# Patient Record
Sex: Female | Born: 1990 | Race: Black or African American | Hispanic: No | Marital: Married | Smoking: Former smoker
Health system: Southern US, Community
[De-identification: ages and names within clinical notes are randomized; demographics above are authoritative.]

## PROBLEM LIST (undated history)

## (undated) ENCOUNTER — Inpatient Hospital Stay (HOSPITAL_COMMUNITY): Payer: Self-pay

## (undated) DIAGNOSIS — N760 Acute vaginitis: Secondary | ICD-10-CM

## (undated) DIAGNOSIS — Z7251 High risk heterosexual behavior: Secondary | ICD-10-CM

## (undated) DIAGNOSIS — K029 Dental caries, unspecified: Secondary | ICD-10-CM

## (undated) DIAGNOSIS — B9689 Other specified bacterial agents as the cause of diseases classified elsewhere: Secondary | ICD-10-CM

## (undated) DIAGNOSIS — F1210 Cannabis abuse, uncomplicated: Secondary | ICD-10-CM

## (undated) DIAGNOSIS — Z720 Tobacco use: Secondary | ICD-10-CM

## (undated) HISTORY — PX: NO PAST SURGERIES: SHX2092

## (undated) HISTORY — DX: Cannabis abuse, uncomplicated: F12.10

## (undated) HISTORY — DX: Dental caries, unspecified: K02.9

## (undated) HISTORY — DX: High risk heterosexual behavior: Z72.51

---
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
## 2012-01-05 ENCOUNTER — Ambulatory Visit: Payer: Medicaid Other | Admitting: Family Medicine

## 2012-01-09 ENCOUNTER — Ambulatory Visit (INDEPENDENT_AMBULATORY_CARE_PROVIDER_SITE_OTHER): Payer: Medicaid Other | Admitting: Sports Medicine

## 2012-01-09 VITALS — BP 110/60 | Ht 67.0 in | Wt 170.0 lb

## 2012-01-09 DIAGNOSIS — M25562 Pain in left knee: Secondary | ICD-10-CM

## 2012-01-09 DIAGNOSIS — M549 Dorsalgia, unspecified: Secondary | ICD-10-CM

## 2012-01-09 DIAGNOSIS — M25569 Pain in unspecified knee: Secondary | ICD-10-CM

## 2012-01-09 MED ORDER — NAPROXEN 500 MG PO TABS: 500.0000 mg | ORAL_TABLET | Freq: Two times a day (BID) | ORAL | Status: AC

## 2012-01-09 NOTE — Assessment & Plan Note (Signed)
This patient has an unstable patella. We will start with anti-inflammatories, patellar stabilizing orthosis, as well as formal physical therapy to strengthen her VMO. I will also get some x-rays of her knee. I will see her back in a period of 4-6 weeks, if she is no better at that point we will discuss MRI and surgical intervention. Surgical intervention would involve either lateral release or Fulkerson slide.

## 2012-01-09 NOTE — Progress Notes (Signed)
  Subjective:    Patient ID: Alicia Dunlap, female    DOB: Oct 07, 1991, 21 y.o.   MRN: [id number]  HPI The patient comes in as a referral from, family practice. She's had a long history of left knee pain. She remembers an episode many years ago where she had a patellar luxation. It was back in, however since then she's had episodes of swelling, catching, popping. She denies any locking or buckling. She's never had any treatment for this, and has not yet had any imaging.  Past medical history, surgical history, family history, social history, allergies, and medications reviewed from the medical record and no changes needed.  Review of Systems    No fevers, chills, night sweats, weight loss, chest pain, or shortness of breath.  Social History: Non-smoker. Objective:   Physical Exam General:  Well developed, well nourished, and in no acute distress. Neuro:  Alert and oriented x3, extra-ocular muscles intact. Skin: Warm and dry, no rashes noted. Respiratory:  Not using accessory muscles, speaking in full sentences. Musculoskeletal: Left Knee: Normal to inspection with no erythema or effusion or obvious bony abnormalities. Palpation normal with no warmth, joint line tenderness, patellar tenderness, or condyle tenderness. ROM full in flexion and extension and lower leg rotation. Ligaments with solid consistent endpoints including ACL, PCL, LCL, MCL. Negative Mcmurray's, Apley's, and Thessalonian tests. Non painful patellar compression. Patellar glide without crepitus. Patellar and quadriceps tendons unremarkable. Poor VMO definition. Positive patellar apprehension sign.  Back Exam: Inspection: Unremarkable Motion: Flexion 45 deg, Extension 45 deg, Side Bending to 45 deg bilaterally,  Rotation to 45 deg bilaterally SLR laying:  Negative XSLR laying: Negative Palpable tenderness: None FABER: negative Sensory change: Gross sensation intact to all lumbar and sacral  dermatomes. Reflexes: 2+ at both patellar tendons, 2+ at achilles tendons, Babinski's downgoing.  Strength at foot Plantar-flexion: 5/5    Dorsi-flexion: 5/5    Eversion: 5/5   Inversion: 5/5 Leg strength Quad: 5/5   Hamstring: 5/5   Hip flexor: 5/5   Hip abductors: 5/5 Gait unremarkable.  MSK ultrasound left knee: No effusion. Meniscal structures intact. Quadriceps and patellar tendons intact. Images saved      Assessment & Plan:

## 2012-01-09 NOTE — Patient Instructions (Signed)
Great to see you Alicia Dunlap,  X-rays of your knee. Naproxen twice a day for pain. Patellar stabilizing knee brace. Need to do formal physical therapy.  Come back to see me in 5 weeks.   [name]. [name], M.D. [facility] [address], [place] [address] [phone]

## 2012-01-09 NOTE — Assessment & Plan Note (Signed)
Symptoms most likely related to posture, as well as weak paraspinals. No radicular signs. Will include this in for formal physical therapy.

## 2012-01-27 ENCOUNTER — Emergency Department (HOSPITAL_COMMUNITY): Payer: Self-pay

## 2012-01-27 ENCOUNTER — Encounter (HOSPITAL_COMMUNITY): Payer: Self-pay | Admitting: Emergency Medicine

## 2012-01-27 ENCOUNTER — Emergency Department (HOSPITAL_COMMUNITY)
Admission: EM | Admit: 2012-01-27 | Discharge: 2012-01-28 | Disposition: A | Discharge status: Home / Self Care | Payer: Self-pay | Attending: Emergency Medicine | Admitting: Emergency Medicine

## 2012-01-27 DIAGNOSIS — R0602 Shortness of breath: Secondary | ICD-10-CM | POA: Insufficient documentation

## 2012-01-27 DIAGNOSIS — F17200 Nicotine dependence, unspecified, uncomplicated: Secondary | ICD-10-CM | POA: Insufficient documentation

## 2012-01-27 DIAGNOSIS — R197 Diarrhea, unspecified: Secondary | ICD-10-CM | POA: Insufficient documentation

## 2012-01-27 DIAGNOSIS — K529 Noninfective gastroenteritis and colitis, unspecified: Secondary | ICD-10-CM

## 2012-01-27 DIAGNOSIS — R079 Chest pain, unspecified: Secondary | ICD-10-CM | POA: Insufficient documentation

## 2012-01-27 DIAGNOSIS — K5289 Other specified noninfective gastroenteritis and colitis: Secondary | ICD-10-CM | POA: Insufficient documentation

## 2012-01-27 DIAGNOSIS — R51 Headache: Secondary | ICD-10-CM | POA: Insufficient documentation

## 2012-01-27 DIAGNOSIS — R112 Nausea with vomiting, unspecified: Secondary | ICD-10-CM | POA: Insufficient documentation

## 2012-01-27 LAB — POCT PREGNANCY, URINE: Preg Test, Ur: NEGATIVE

## 2012-01-27 MED ORDER — METOCLOPRAMIDE HCL 5 MG/ML IJ SOLN
10.0000 mg | Freq: Once | INTRAMUSCULAR | Status: AC
Start: 2012-01-28 — End: 2012-01-28
  Administered 2012-01-28: 10 mg via INTRAVENOUS
  Filled 2012-01-27: qty 2

## 2012-01-27 MED ORDER — ONDANSETRON HCL 4 MG/2ML IJ SOLN
4.0000 mg | Freq: Once | INTRAMUSCULAR | Status: AC
  Administered 2012-01-27: 4 mg via INTRAVENOUS
  Filled 2012-01-27: qty 2

## 2012-01-27 MED ORDER — SODIUM CHLORIDE 0.9 % IV BOLUS (SEPSIS)
1000.0000 mL | Freq: Once | INTRAVENOUS | Status: AC
  Administered 2012-01-27: 1000 mL via INTRAVENOUS

## 2012-01-27 MED ORDER — SODIUM CHLORIDE 0.9 % IV BOLUS (SEPSIS)
500.0000 mL | INTRAVENOUS | Status: AC
  Administered 2012-01-28: 500 mL via INTRAVENOUS

## 2012-01-27 MED ORDER — KETOROLAC TROMETHAMINE 30 MG/ML IJ SOLN
30.0000 mg | Freq: Once | INTRAMUSCULAR | Status: AC
  Administered 2012-01-27: 30 mg via INTRAVENOUS
  Filled 2012-01-27: qty 1

## 2012-01-27 NOTE — ED Notes (Signed)
Pt presented to the ER with multiple complaints, states she has "terrible HA" 10/10, pt noted to be tearful in the triage, pt further states that sx started earlier this am and that she can not decrease pain and discomfort. Pt states that secondary to HA she has photophobia as well. Diarrhea x6 and vomiting x2 episodes since this am.

## 2012-01-27 NOTE — ED Provider Notes (Signed)
History     CSN: [id number]  Arrival date & time 01/27/12  2010   First MD Initiated Contact with Patient 01/27/12 2210      Chief Complaint  Patient presents with  . N/V/D and HA     (Consider location/radiation/quality/duration/timing/severity/associated sxs/prior treatment) HPI History provided by pt.   Pt has had vomiting and diarrhea since this morning.  Denies abdominal pain but has mild cramping prior to having a BM.  No hematemesis/hematochezia/melena or GU sx.  Also c/o intermittent, pressure-like frontal headache that also started this morning.  Aggravated by bright light.  No associated fever, dizziness or blurred vision.   No h/o migraines and denies trauma to head.  Has diffuse body aches, generalized weakness and lightheadedness.  Has also had productive cough x 1 month.  No CP but is SOB when she smokes cigarettes.  Has not taken any medications for sx.  No known sick contacts.   History reviewed. No pertinent past medical history.   History reviewed. No pertinent past surgical history.  History reviewed. No pertinent family history.  History  Substance Use Topics  . Smoking status: Current Some Day Smoker    Types: Cigarettes, Cigars  . Smokeless tobacco: Never Used  . Alcohol Use: Yes    OB History    Grav Para Term Preterm Abortions TAB SAB Ect Mult Living                  Review of Systems  All other systems reviewed and are negative.    Allergies  Amoxicillin  Home Medications   Current Outpatient Rx  Name Route Sig Dispense Refill  . NAPROXEN 500 MG PO TABS Oral Take 1 tablet (500 mg total) by mouth 2 (two) times daily with a meal. 60 tablet 3    BP 121/74  Pulse 111  Temp(Src) 99.3 F (37.4 C) (Oral)  SpO2 99%  LMP 12/22/2011  Physical Exam  Nursing note and vitals reviewed. Constitutional: She is oriented to person, place, and time. She appears well-developed and well-nourished. No distress.       Pt laying in dark room.  Does not  appear uncomfortable.   HENT:  Head: Normocephalic.  Eyes:       Normal appearance  Neck: Normal range of motion.       No meningeal signs  Cardiovascular: Normal rate and regular rhythm.   Pulmonary/Chest: Effort normal and breath sounds normal.  Abdominal: Soft. Bowel sounds are normal. She exhibits no distension and no mass. There is no tenderness. There is no rebound and no guarding.  Musculoskeletal: Normal range of motion.  Neurological: She is alert and oriented to person, place, and time. No cranial nerve deficit or sensory deficit. Coordination normal.       5/5 and equal upper and lower extremity strength.  No past pointing.    Skin: Skin is warm and dry. No rash noted.  Psychiatric: She has a normal mood and affect. Her behavior is normal.    ED Course  Procedures (including critical care time)  Labs Reviewed  URINALYSIS, ROUTINE W REFLEX MICROSCOPIC - Abnormal; Notable for the following:    APPearance CLOUDY (*)    Ketones, ur >80 (*)    All other components within normal limits  POCT PREGNANCY, URINE   Dg Chest 2 View  01/27/2012  *RADIOLOGY REPORT*  Clinical Data: Shortness of breath, vomiting, and chest pain.  CHEST - 2 VIEW  Comparison: 11/18/2011  Findings: The heart, mediastinal, and  hilar contours are normal. The lungs are well-expanded and clear.  Negative for pleural effusion. The bony thorax is unremarkable.  IMPRESSION: No acute cardiopulmonary disease.  Original Report Authenticated By: Britta Mccreedy, M.D.     1. Headache   2. Gastroenteritis       MDM  21yo F presents w/ N/V/D, non-traumatic frontal headache and 1 month of cough.  Afebrile, tachycardic, lungs clearn, abd benign/non-tender, no focal neuro deficits or meningeal signs on exam.  Pt receiving IV NS, toradol and zofran for sx.  CXR pending to r/o pneumonia d/t duration of cough.  10:38 PM   Pt reports that all of her symptoms are improved but she continues to have a mild headache.  She is  tolerating po fluids.  Will give another 1/2L bolus NS + reglan, recheck VS and reassess shortly.  11:56 PM   U/A and CXR neg for infection.  Results discussed w/ pt.  Her headache has resolved.  D/c'd home w/ prescriptions for zofran and ibuprofen.  Recommended that she drink plenty of fluids and f/u with PCP if sx have not improved by Monday.  Return precautions discussed. 12:54 AM         Otilio Miu, PA 01/28/12 [id number]

## 2012-01-28 LAB — URINALYSIS, ROUTINE W REFLEX MICROSCOPIC
Glucose, UA: NEGATIVE mg/dL
Ketones, ur: >80 mg/dL — AB
Leukocytes, UA: NEGATIVE
Specific Gravity, Urine: 1.025 (ref 1.005–1.030)
pH: 5.5 (ref 5.0–8.0)

## 2012-01-28 MED ORDER — IBUPROFEN 800 MG PO TABS: 800.0000 mg | ORAL_TABLET | Freq: Three times a day (TID) | ORAL | Status: AC

## 2012-01-28 MED ORDER — ONDANSETRON HCL 8 MG PO TABS: 8.0000 mg | ORAL_TABLET | Freq: Four times a day (QID) | ORAL | Status: AC

## 2012-01-28 NOTE — Discharge Instructions (Signed)
Take zofran as needed for nausea.   Take imodium if you have more than 3-4 episodes of diarrhea a day.  Drink plenty of fluids to prevent dehydration.  Refer to [employer] below for foods that are less likely to cause nausea.  Take ibuprofen w/ food up to three times a day, as needed for headache.  Follow up with your primary care doctor if symptoms have not started to improve by Monday.  You should return to the ER if you develop fever, worsening pain or uncontrolled vomiting.   B.R.A.T. Diet Your doctor has recommended the B.R.A.T. diet for you or your child until the condition improves. This is often used to help control diarrhea and vomiting symptoms. If you or your child can tolerate clear liquids, you may have:  Bananas.   Rice.   Applesauce.   Toast (and other simple starches such as crackers, potatoes, noodles).  Be sure to avoid dairy products, meats, and fatty foods until symptoms are better. Fruit juices such as apple, grape, and prune juice can make diarrhea worse. Avoid these. Continue this diet for 2 days or as instructed by your caregiver. Document Released: 10/30/2005 Document Revised: 10/19/2011 Document Reviewed: 04/18/2007 [facility] Patient Information 2012 [place], [place].

## 2012-01-28 NOTE — ED Provider Notes (Signed)
Medical screening examination/treatment/procedure(s) were performed by non-physician practitioner and as supervising physician I was immediately available for consultation/collaboration.    Nelia Shi, MD 01/28/12 [phone]

## 2012-02-13 ENCOUNTER — Ambulatory Visit: Payer: Medicaid Other | Admitting: Sports Medicine

## 2012-02-22 ENCOUNTER — Ambulatory Visit (INDEPENDENT_AMBULATORY_CARE_PROVIDER_SITE_OTHER): Payer: Self-pay | Admitting: Family Medicine

## 2012-02-22 ENCOUNTER — Encounter: Payer: Self-pay | Admitting: Family Medicine

## 2012-02-22 ENCOUNTER — Other Ambulatory Visit (HOSPITAL_COMMUNITY)
Admission: RE | Admit: 2012-02-22 | Discharge: 2012-02-22 | Disposition: A | Discharge status: Home / Self Care | Payer: Self-pay | Source: Ambulatory Visit | Attending: Family Medicine | Admitting: Family Medicine

## 2012-02-22 VITALS — BP 96/68 | HR 84 | Temp 98.7°F | Ht 66.0 in | Wt 158.0 lb

## 2012-02-22 DIAGNOSIS — N912 Amenorrhea, unspecified: Secondary | ICD-10-CM

## 2012-02-22 DIAGNOSIS — N898 Other specified noninflammatory disorders of vagina: Secondary | ICD-10-CM

## 2012-02-22 DIAGNOSIS — J309 Allergic rhinitis, unspecified: Secondary | ICD-10-CM

## 2012-02-22 DIAGNOSIS — Z202 Contact with and (suspected) exposure to infections with a predominantly sexual mode of transmission: Secondary | ICD-10-CM

## 2012-02-22 DIAGNOSIS — J302 Other seasonal allergic rhinitis: Secondary | ICD-10-CM | POA: Insufficient documentation

## 2012-02-22 DIAGNOSIS — Z113 Encounter for screening for infections with a predominantly sexual mode of transmission: Secondary | ICD-10-CM | POA: Insufficient documentation

## 2012-02-22 DIAGNOSIS — N760 Acute vaginitis: Secondary | ICD-10-CM

## 2012-02-22 LAB — POCT URINE PREGNANCY: Preg Test, Ur: NEGATIVE

## 2012-02-22 LAB — POCT WET PREP (WET MOUNT)

## 2012-02-22 LAB — POCT URINALYSIS DIPSTICK
Bilirubin, UA: NEGATIVE
Blood, UA: NEGATIVE
Glucose, UA: NEGATIVE
Ketones, UA: NEGATIVE
pH, UA: 5.5

## 2012-02-22 NOTE — Assessment & Plan Note (Signed)
Restart Flonase.  Recommend regular use of OTC second generation antihistamine.

## 2012-02-22 NOTE — Patient Instructions (Signed)
I will send in a refill for nasal spray.  Use this everyday for at least the next couple months during the worst of the allergy season. Try Zyrterc over the counter.  Try this daily for the next 2 weeks.  If you don't notice improvement, try either Allegra or Claritin.   If this isn't helping let me know.  We will call you with the results of the tests.

## 2012-02-22 NOTE — Progress Notes (Signed)
  Subjective:    Patient ID: Alicia Dunlap, female    DOB: [birth year], 21 y.o.   MRN: [id number]  HPI 1.  Seasonal allergies:  Started about 1 week ago but worsened after spending after 1 day spending most of day outside.  Took generic OTC med last night without any relief, what sounds like generic benadryl.  Has not taken anything else.  Trouble with allergies treated with inhaled corticosteroid last year.  Feels allergies gradually worsening over time.  No recent illnesses.  No fevers or chills.    2.  Amenorhea: Present for the past 2 months. Does describe gradually lightening of menses since December. Menses now lasting only about one day at a time. Is sexually active with 1 partner.  Uses condoms for protection every time. No abdominal pain.  3.  Vaginal discharge:  Described as whitish/clear vaginal discharge present for the past 2-3 weeks. Some itching. No burning. No nausea or vomiting.  No dysuria.    The following portions of the patient's history were reviewed and updated as appropriate: allergies, current medications, past medical history, family and social history, and problem list.  Patient is a nonsmoker.   Review of Systems See HPI above for review of systems.        Objective:   Physical Exam BP 96/68  Pulse 84  Temp(Src) 98.7 F (37.1 C) (Oral)  Ht 5\' 6"  (1.676 m)  Wt 158 lb (71.668 kg)  BMI 25.50 kg/m2  LMP 12/22/2011 Gen:  Patient sitting on exam table, appears stated age in no acute distress Head: Normocephalic atraumatic Eyes: EOMI, PERRL, sclera and conjunctiva non-erythematous Nose:  Nares patent.   Mouth: Mucosa membranes moist. Tonsils +2, nonenlarged, non-erythematous. Neck: No cervical lymphadenopathy noted Heart:  RRR, no murmurs auscultated. Pulm:  Clear to auscultation bilaterally with good air movement.  No wheezes or rales noted.   GYN:  External genitalia within normal limits.  Vaginal mucosa pink, moist, normal rugae.  Nonfriable cervix without  lesions, no discharge or bleeding noted on speculum exam.  Bimanual exam revealed normal, nongravid uterus.  No cervical motion tenderness. No adnexal masses bilaterally.            Assessment & Plan:

## 2012-02-23 DIAGNOSIS — N898 Other specified noninflammatory disorders of vagina: Secondary | ICD-10-CM | POA: Insufficient documentation

## 2012-02-23 LAB — HEPATITIS PANEL, ACUTE
HCV Ab: NEGATIVE
Hep B C IgM: NEGATIVE
Hepatitis B Surface Ag: NEGATIVE

## 2012-02-23 LAB — RPR

## 2012-02-23 NOTE — Assessment & Plan Note (Signed)
Wet prep GC chlamydia obtained today. We will call patient with results.

## 2012-02-23 NOTE — Assessment & Plan Note (Signed)
Likely secondary to Implanon use. Pregnancy test negative.

## 2012-02-27 ENCOUNTER — Telehealth: Payer: Self-pay | Admitting: Family Medicine

## 2012-02-27 MED ORDER — METRONIDAZOLE 500 MG PO TABS: 500.0000 mg | ORAL_TABLET | Freq: Two times a day (BID) | ORAL | Status: AC

## 2012-02-27 NOTE — Telephone Encounter (Signed)
Called and discussed negative labs except for BV.  Patient appreciative of call.  Discussed treatment

## 2012-03-01 NOTE — Progress Notes (Signed)
Patient ID: Alicia Dunlap, female   DOB: [birth year], 21 y.o.   MRN: [id number]   Subjective:    Patient ID: Alicia Dunlap, female    DOB: 1991-09-13, 21 y.o.   MRN: [id number]  HPI One. Back pain: Patient returns to clinic today because she states her back is no better. She states the pain is now radiating up to her upper back and muscles around her neck. She states she was crying last night because of the pain. She states the tramadol did not help at all with her pain. The baclofen did ease the pain some times. She denies any urinary incontinence saddle anesthesia or bowel incontinence. Describes dull aching pain alternating with sometimes sharp shooting pain in her back. No urinary or bowel incontinence.  #2. Left knee pain: Patient states this is also worsening. She feels that her knee is somewhat swollen. She states it has now given out on her several times and occasionally locked up. She also hears a "snap" or "pop" when bending her knee sometimes and the pain is worse both going up and downstairs. States pain is so that she bad has to take the elevator at school. No fevers or chills. No recent trauma. Does have history of patellar subluxation at age 20 and again fell with same injury " several years ago" but she is unable to remember exactly how old.  Review of Systems See HPI above for review of systems.       Objective:   Physical Exam Gen:  Alert, cooperative patient who appears stated age in no acute distress.  Vital signs reviewed. Back:  Tender along the entire back/paraspinal muscles. Also tenderness along trapezius muscles. Multiple triggerpoints noted. Tender along lumbar region. Straight leg raise negative for back pain or hamstring pain. Patient able to fully flex neck left right up and down. Knees: Right knee within the normal limits. Left knee with mild effusion noted medial and lateral joint lines. No erythema or bruising. Full range of motion both active and passively. No hip  pain. Anterior posterior drawer tests are negative. Lachman test equivocal. Palpation along medial and lateral joint line. Did not note any suprapatellar effusion. No effusion noted the popliteal fossa.     Assessment & Plan:    ** 03/01/12 **   Have been unable to close this note.  Tried multiple times.  Copied and pasted text, then deleted prior note and pasted identical note back into Progress Notes section hoping this will finally close note.

## 2012-06-02 ENCOUNTER — Emergency Department (HOSPITAL_COMMUNITY)
Admission: EM | Admit: 2012-06-02 | Discharge: 2012-06-02 | Discharge status: Against Medical Advice | Payer: Self-pay | Attending: Emergency Medicine | Admitting: Emergency Medicine

## 2012-06-02 ENCOUNTER — Encounter (HOSPITAL_COMMUNITY): Payer: Self-pay | Admitting: Emergency Medicine

## 2012-06-02 DIAGNOSIS — R112 Nausea with vomiting, unspecified: Secondary | ICD-10-CM | POA: Insufficient documentation

## 2012-06-02 LAB — URINALYSIS, ROUTINE W REFLEX MICROSCOPIC
Bilirubin Urine: NEGATIVE
Hgb urine dipstick: NEGATIVE
Ketones, ur: 40 mg/dL — AB
Nitrite: NEGATIVE
Urobilinogen, UA: 1 mg/dL (ref 0.0–1.0)
pH: 6 (ref 5.0–8.0)

## 2012-06-02 LAB — URINE MICROSCOPIC-ADD ON

## 2012-06-02 NOTE — ED Notes (Signed)
Pt alert, nad, arrives from home, c/o nausea and emesis, onset was yesterday, denies recent ill exposures, resp even unlabored, skin pwd, states poor PO intake as a result

## 2012-06-07 ENCOUNTER — Ambulatory Visit: Payer: Self-pay | Admitting: Family Medicine

## 2012-08-14 ENCOUNTER — Ambulatory Visit: Payer: Self-pay | Admitting: Family Medicine

## 2012-09-05 ENCOUNTER — Encounter: Payer: Self-pay | Admitting: Family Medicine

## 2012-09-05 ENCOUNTER — Other Ambulatory Visit (HOSPITAL_COMMUNITY)
Admission: RE | Admit: 2012-09-05 | Discharge: 2012-09-05 | Disposition: A | Discharge status: Home / Self Care | Payer: Self-pay | Source: Ambulatory Visit | Attending: Family Medicine | Admitting: Family Medicine

## 2012-09-05 ENCOUNTER — Ambulatory Visit (HOSPITAL_COMMUNITY)
Admission: RE | Admit: 2012-09-05 | Discharge: 2012-09-05 | Disposition: A | Discharge status: Home / Self Care | Payer: Self-pay | Source: Ambulatory Visit | Attending: Family Medicine | Admitting: Family Medicine

## 2012-09-05 ENCOUNTER — Ambulatory Visit (INDEPENDENT_AMBULATORY_CARE_PROVIDER_SITE_OTHER): Payer: Self-pay | Admitting: Family Medicine

## 2012-09-05 VITALS — BP 118/71 | HR 88 | Temp 99.3°F | Ht 66.6 in | Wt 149.0 lb

## 2012-09-05 DIAGNOSIS — Z7251 High risk heterosexual behavior: Secondary | ICD-10-CM

## 2012-09-05 DIAGNOSIS — F1210 Cannabis abuse, uncomplicated: Secondary | ICD-10-CM

## 2012-09-05 DIAGNOSIS — R079 Chest pain, unspecified: Secondary | ICD-10-CM

## 2012-09-05 DIAGNOSIS — N760 Acute vaginitis: Secondary | ICD-10-CM

## 2012-09-05 DIAGNOSIS — Z113 Encounter for screening for infections with a predominantly sexual mode of transmission: Secondary | ICD-10-CM | POA: Insufficient documentation

## 2012-09-05 LAB — POCT WET PREP (WET MOUNT)

## 2012-09-05 LAB — HIV ANTIBODY (ROUTINE TESTING W REFLEX): HIV: NONREACTIVE

## 2012-09-05 NOTE — Progress Notes (Signed)
Family Medicine Office Visit Note   Subjective:   Patient ID: Alicia Dunlap, female  DOB: December 06, 1990, 21 y.o.. MRN: [id number]   Pt that comes today because she started recently a new homosexual relationship and wanted to know if she is "clean". Her partner has lip sores and she also was concerned about its transmission. Denies vaginal discharge. Skin rash or joint pain/swelling. Afebrile   Chest pain: Pt reports having intermittent chest pain on the left and right side of chest that irradiates to the back associated with marihuana smoking. Pain does not modify with position. Denies pain with exertion, alleviated with no pharmacologic intervention. No pain reported at this time.  Review of Systems:  Pt denies SOB, headaches, dizziness, numbness or weakness. No changes on urinary or BM habits. No unintentional weigh loss/gain.  Objective:   Physical Exam: Gen:  NAD HEENT: Moist mucous membranes  CV: Regular rate and rhythm, no murmurs rubs or gallops PULM: Clear to auscultation bilaterally. No wheezes/rales/rhonchi ABD: Soft, non tender, non distended, normal bowel sounds EXT: No edema Neuro: Alert and oriented x3. No focalization Declines Gynecologic exam today but agrees to self collect wet prep sample.  Assessment & Plan:

## 2012-09-05 NOTE — Patient Instructions (Addendum)
I will call you if labs are abnormal otherwise we will discuss then at your next appointment. F/u appointment in 6 months.  Marijuana Abuse and Chemical Dependency WHEN IS DRUG USE A PROBLEM? Problems related to drug use usually begin with abuse of the substance and lead to dependency.  Abuse is repeated use of a drug with recurrent and significant negative consequences. Abuse happens anytime drug use is interfering with normal living activities including:   Failure to fulfill major obligations at work, school or home (poor work International aid/development worker, missing work or school and/or neglecting children and home).  Engaging in activities that are physically dangerous (driving a car or doing recreational activities such as swimming or rock climbing) while under the effects of the drug.  Recurrent drug-related legal problems (arrests for disorderly conduct or assault and battery).  Recurrent social or interpersonal problems caused or increased by the effects of the drug (arguments with family or friends, or physical fights). Dependency has two parts.   You first develop an emotional/psychological dependence. Psychological dependence develops when your mind tells you that the drug is needed. You come to believe it helps you cope with life.  This is usually followed by physical dependence which has developed when continuing increases of drugs are required to get the same feeling or "high." This may result in:  Withdrawal symptoms such as shakes or tremors.  The substance being over a longer period of time than intended.  An ongoing desire, or unsuccessful effort to, cut down or control the use.  Greater amounts of time spent getting the drug, using the drug or recovering from the effects of the drug.  Important social, work or interests and activities are given up or reduced because or drug use.  Substance is used despite knowledge of ongoing physical (ulcers) or psychological (depression)  problems. SIGNS OF CHEMICAL DEPENDENCY:  Friends or family say there is a problem.  Fighting when using drugs.  Having blackouts (not remembering what you do while using).  Feel sick from using drugs but continue using.  Lie about use or amounts of drugs used.  Need drugs to get you going.  Need drugs to relate to people or feel comfortable in social situations.  Use drugs to forget problems. A "yes" answered to any of the above signs of chemical dependency indicates there are problems. The longer the use of drugs continues, the greater the problems will become. If there is a family history of drug or alcohol use it is best not to experiment with drugs. Experimentation leads to tolerance. Addiction is followed by dependency where drugs are now needed not just to get high but to feel normal. Addiction cannot be cured but it can be stopped. This often requires outside help and the care of professionals. Treatment centers are listed in the yellow pages under: Cocaine, Narcotics, and Alcoholics anonymous. Most hospitals and clinics can refer you to a specialized care center. WHAT IS MARIJUANA? Marijuana is a plant which grows wild all over the world. The plant contains many chemicals but the active ingredient of the plant is THC (tetrahydrocannabinol). This is responsible for the "high" perceived by people using the drug. HOW IS MARIJUANA USED? Marijuana is smoked, eaten in brownies or any other food, and drank as a tea. WHAT ARE THE EFFECTS OF MARIJUANA? Marijuana is a nervous system depressant which slows the thinking process. Because of this effect, users think marijuana has a calming effect. Actually what happens is the air carrying tubules in the  lung become relaxed and allow more oxygen to enter. This causes the user to feel high. The blood pressure falls so less blood reaches the brain and the heart speeds up. As the effects wear off the user becomes depressed. Some people become very  paranoid during use. They feel as though people are out to get them. Periodic use can interfere with performance at school or work. Generally Marijuana use does not develop into a physical dependence, but it is very habit forming. Marijuana is also seen as a gateway to use of harder drugs. Strong habits such as using Marijuana, as with all drugs and addictions, can only be helped by stopping use of all chemicals. This is hard but may save your life.  OTHER HEALTH RISKS OF MARIJUANA AND DRUG USE ARE: The increased possibility of getting AIDS or hepatitis (liver inflammation).  HOW TO STAY DRUG FREE ONCE YOU HAVE QUIT USING:  Develop healthy activities and form friends who do not use drugs.  Stay away from the drug scene.  Tell the those who want you to use drugs you have other, better things to do.  Have ready excuses available about why you cannot use.  Attend 12-Step Meetings for support from other recovering people. FOR MORE HELP OR INFORMATION CONTACT YOUR LOCAL CAREGIVER, CLINIC, HOSPITAL OR DIAL 1-800-MARIJUANA (713)643-7804). Document Released: 10/27/2000 Document Revised: 01/22/2012 Document Reviewed: 11/27/2007 [facility] Patient Information 2013 [place], [place].

## 2012-09-06 ENCOUNTER — Telehealth: Payer: Self-pay | Admitting: Family Medicine

## 2012-09-06 DIAGNOSIS — F1210 Cannabis abuse, uncomplicated: Secondary | ICD-10-CM

## 2012-09-06 DIAGNOSIS — Z7251 High risk heterosexual behavior: Secondary | ICD-10-CM | POA: Insufficient documentation

## 2012-09-06 HISTORY — DX: Cannabis abuse, uncomplicated: F12.10

## 2012-09-06 NOTE — Assessment & Plan Note (Signed)
Explicit and open about her drug addiction. No interest in quitting, regardless chest pain she relates to happen with smoking. Denies use of cocaine. EKG obtained Showed Normal sinus rhythm with sinus arrhythmia read as Normal ECG Plan: Counseled about drug abuse. Hand out given. Resources to help with addiction offered. Pt did not seemed interested in quitting.

## 2012-09-06 NOTE — Telephone Encounter (Signed)
Called pt to both numbers provided [phone] and [phone]. No voice mail set up. Pt is positive for chlamydia and needs treatment with Azithromycin 1 gram single dose.  Message will be route to our triage nursing staff.

## 2012-09-06 NOTE — Assessment & Plan Note (Addendum)
Hx of promiscuity. Multiple sexual partners in the past. Now new homosexual relationship. No symptoms of vaginal discharge. Plan: Due to High Risk Factors for STD, RPR, HIV and GC and Chlamydia were collected today. Will f/u as results arrive. Discussion about safe sex practices.

## 2012-09-09 ENCOUNTER — Ambulatory Visit (INDEPENDENT_AMBULATORY_CARE_PROVIDER_SITE_OTHER): Payer: Self-pay | Admitting: *Deleted

## 2012-09-09 DIAGNOSIS — A749 Chlamydial infection, unspecified: Secondary | ICD-10-CM

## 2012-09-09 MED ORDER — AZITHROMYCIN 1 G PO PACK
1.0000 g | PACK | Freq: Once | ORAL | Status: AC
  Administered 2012-09-09: 1 g via ORAL

## 2012-09-09 NOTE — Progress Notes (Signed)
In for STD treatment. Advised to tell all sexual partners of need to be treated. Advised to abstain from sex for 7 days.  Communicable Disease report faxed to [facility].

## 2012-11-29 ENCOUNTER — Emergency Department (INDEPENDENT_AMBULATORY_CARE_PROVIDER_SITE_OTHER)
Admission: EM | Admit: 2012-11-29 | Discharge: 2012-11-29 | Disposition: A | Discharge status: Home / Self Care | Payer: Medicaid Other | Source: Home / Self Care

## 2012-11-29 ENCOUNTER — Encounter (HOSPITAL_COMMUNITY): Payer: Self-pay | Admitting: Emergency Medicine

## 2012-11-29 DIAGNOSIS — A084 Viral intestinal infection, unspecified: Secondary | ICD-10-CM

## 2012-11-29 DIAGNOSIS — A088 Other specified intestinal infections: Secondary | ICD-10-CM

## 2012-11-29 NOTE — ED Provider Notes (Signed)
Alicia Dunlap is a 22 y.o. female who presents to Urgent Care today for headache, nausea, vomiting, and diarrhea. This started today. Patient notes that the vomiting has stopped and the diarrhea seems to be stopping. She denies any melena or blood in her stool. She notes her vomit is bile color.  She denies any blood in her vomit.  She's been drinking fluids and eating crackers which seems to help some. Additionally she notes a mild headache without dizziness blurry vision difficulty walking or numbness. She has not tried any medication for either of her complaints.  No fevers chills trouble breathing. No other sick contacts. Last vomited at 11:00 this morning.   PMH reviewed. Healthy otherwise History  Substance Use Topics  . Smoking status: Never Smoker   . Smokeless tobacco: Not on file  . Alcohol Use: No   ROS as above Medications reviewed. No current facility-administered medications for this encounter.   No current outpatient prescriptions on file.    Exam:  BP 112/72  Pulse 105  Temp 98.6 F (37 C) (Oral)  Resp 16  SpO2 100%  LMP 11/22/2012 Gen: Well NAD HEENT: EOMI,  MMM Lungs: CTABL Nl WOB Heart: RRR no MRG Abd: NABS, NT, ND Exts: Non edematous BL  LE, warm and well perfused.  Neuro: Normal balance and station alert and oriented normal gait Neck: Normal neck range of motion no meningismus.  No results found for this or any previous visit (from the past 24 hour(s)). No results found.  Assessment and Plan: 22 y.o. female with viral gastroenteritis. Plan symptomatic treatment. Recommended fluid rehydration with Gatorade.  Additionally recommended headache control with Tylenol. I do not feel that she needs prescription antivomiting medication and she has not vomited since 11:00 this morning.   She does not appear to be significantly clinically dehydrated therefore oral rehydration will be beneficial.  Discussed warning signs or symptoms. Please see discharge  instructions. Patient expresses understanding.      Rodolph Bong, MD 11/29/12 2015

## 2012-11-29 NOTE — ED Provider Notes (Signed)
Medical screening examination/treatment/procedure(s) were performed by resident physician or non-physician practitioner and as supervising physician I was immediately available for consultation/collaboration.   Kaydin Karbowski DOUGLAS MD.    Kendricks Reap D Pixie Burgener, MD 11/29/12 2027 

## 2012-11-29 NOTE — ED Notes (Signed)
Pt c/o vomiting and diarrhea with a headache that started this morning. Pt states having some chest pain. Denies hx of migraines. Pt has tried brat diet and crackers with no relief or able to keep anything down.

## 2012-12-11 ENCOUNTER — Ambulatory Visit: Payer: Self-pay | Admitting: Family Medicine

## 2012-12-17 ENCOUNTER — Ambulatory Visit (INDEPENDENT_AMBULATORY_CARE_PROVIDER_SITE_OTHER): Payer: Medicaid Other | Admitting: Family Medicine

## 2012-12-17 ENCOUNTER — Other Ambulatory Visit (HOSPITAL_COMMUNITY)
Admission: RE | Admit: 2012-12-17 | Discharge: 2012-12-17 | Disposition: A | Discharge status: Home / Self Care | Payer: Medicaid Other | Source: Ambulatory Visit | Attending: Family Medicine | Admitting: Family Medicine

## 2012-12-17 ENCOUNTER — Encounter: Payer: Self-pay | Admitting: Family Medicine

## 2012-12-17 VITALS — BP 130/73 | HR 80 | Temp 98.9°F | Wt 168.5 lb

## 2012-12-17 DIAGNOSIS — Z113 Encounter for screening for infections with a predominantly sexual mode of transmission: Secondary | ICD-10-CM | POA: Insufficient documentation

## 2012-12-17 DIAGNOSIS — N760 Acute vaginitis: Secondary | ICD-10-CM

## 2012-12-17 DIAGNOSIS — Z7251 High risk heterosexual behavior: Secondary | ICD-10-CM

## 2012-12-17 DIAGNOSIS — Z2089 Contact with and (suspected) exposure to other communicable diseases: Secondary | ICD-10-CM

## 2012-12-17 DIAGNOSIS — Z202 Contact with and (suspected) exposure to infections with a predominantly sexual mode of transmission: Secondary | ICD-10-CM

## 2012-12-17 LAB — POCT WET PREP (WET MOUNT): WBC, Wet Prep HPF POC: >20

## 2012-12-17 LAB — RPR

## 2012-12-17 NOTE — Patient Instructions (Addendum)
Sexually Transmitted Disease  Sexually transmitted disease (STD) refers to any infection that is passed from person to person during sexual activity. This may happen by way of saliva, semen, blood, vaginal mucus, or urine. Common STDs include:   Gonorrhea.   Chlamydia.   Syphilis.   HIV/AIDS.   Genital herpes.   Hepatitis B and C.   Trichomonas.   Human papillomavirus (HPV).   Pubic lice.  CAUSES   An STD may be spread by bacteria, virus, or parasite. A person can get an STD by:   Sexual intercourse with an infected person.   Sharing sex toys with an infected person.   Sharing needles with an infected person.   Having intimate contact with the genitals, mouth, or rectal areas of an infected person.  SYMPTOMS   Some people may not have any symptoms, but they can still pass the infection to others. Different STDs have different symptoms. Symptoms include:   Painful or bloody urination.   Pain in the pelvis, abdomen, vagina, anus, throat, or eyes.   Skin rash, itching, irritation, growths, or sores (lesions). These usually occur in the genital or anal area.   Abnormal vaginal discharge.   Penile discharge in men.   Soft, flesh-colored skin growths in the genital or anal area.   Fever.   Pain or bleeding during sexual intercourse.   Swollen glands in the groin area.   Yellow skin and eyes (jaundice). This is seen with hepatitis.  DIAGNOSIS   To make a diagnosis, your caregiver may:   Take a medical history.   Perform a physical exam.   Take a specimen (culture) to be examined.   Examine a sample of discharge under a microscope.   Perform blood tests.   Perform a Pap test, if this applies.   Perform a colposcopy.   Perform a laparoscopy.  TREATMENT    Chlamydia, gonorrhea, trichomonas, and syphilis can be cured with antibiotic medicine.   Genital herpes, hepatitis, and HIV can be treated, but not cured, with prescribed medicines. The medicines will lessen the symptoms.   Genital warts  from HPV can be treated with medicine or by freezing, burning (electrocautery), or surgery. Warts may come back.   HPV is a virus and cannot be cured with medicine or surgery.However, abnormal areas may be followed very closely by your caregiver and may be removed from the cervix, vagina, or vulva through office procedures or surgery.  If your diagnosis is confirmed, your recent sexual partners need treatment. This is true even if they are symptom-free or have a negative culture or evaluation. They should not have sex until their caregiver says it is okay.  HOME CARE INSTRUCTIONS   All sexual partners should be informed, tested, and treated for all STDs.   Take your antibiotics as directed. Finish them even if you start to feel better.   Only take over-the-counter or prescription medicines for pain, discomfort, or fever as directed by your caregiver.   Rest.   Eat a balanced diet and drink enough fluids to keep your urine clear or pale yellow.   Do not have sex until treatment is completed and you have followed up with your caregiver. STDs should be checked after treatment.   Keep all follow-up appointments, Pap tests, and blood tests as directed by your caregiver.   Only use latex condoms and water-soluble lubricants during sexual activity. Do not use petroleum jelly or oils.   Avoid alcohol and illegal drugs.   Get vaccinated   for HPV and hepatitis. If you have not received these vaccines in the past, talk to your caregiver about whether one or both might be right for you.   Avoid risky sex practices that can break the skin.  The only way to avoid getting an STD is to avoid all sexual activity.Latex condoms and dental dams (for oral sex) will help lessen the risk of getting an STD, but will not completely eliminate the risk.  SEEK MEDICAL CARE IF:    You have a fever.   You have any new or worsening symptoms.  Document Released: 01/20/2003 Document Revised: 01/22/2012 Document Reviewed:  01/27/2011  [employer] Patient Information 2013 [employer].

## 2012-12-18 NOTE — Assessment & Plan Note (Signed)
I have seen this pt with same complaint. Pt is high risk for STD due to her sexual behavior. Long discussion about this and how to prevent STD. Plan: RPR, HIV, GC and CT Wet prep.

## 2012-12-18 NOTE — Progress Notes (Signed)
Family Medicine Office Visit Note   Subjective:   Patient ID: Alicia Dunlap, female  DOB: 1991/07/16, 22 y.o.. MRN: [id number]   Pt that comes today desiring to get tested for STD's. She ended her homosexual relationship and  reports her partner had unprotected sex activity with other people while in the relationship with her. Pt now has a new female partner and desires to know if she is "clean". Denies vaginal bleeding or discharge. No skin rashes or lesions.  No joint pain/ effusion/ erythema.  Review of Systems:  Per HPI.  Objective:   Physical Exam: Gen:  NAD Vulva and perianal area: Normal  Speculum: Vagina and cervix of normal appearance, no friability, no discharge. Sample for wet prep GC and CT collected.  Bimanual exam: Uterus anteverted, no adnexal masses. No cervical motion tenderness.  Assessment & Plan:

## 2012-12-20 ENCOUNTER — Ambulatory Visit: Payer: Medicaid Other | Admitting: Family Medicine

## 2013-01-02 ENCOUNTER — Telehealth: Payer: Self-pay | Admitting: Family Medicine

## 2013-01-02 NOTE — Telephone Encounter (Signed)
Pt was here a few weeks ago and had tests done but didn't hear results - she still has a "smell" and isn't sure if it's something they tested for,  pls advise

## 2013-01-03 ENCOUNTER — Other Ambulatory Visit: Payer: Self-pay | Admitting: Family Medicine

## 2013-01-03 DIAGNOSIS — B3731 Acute candidiasis of vulva and vagina: Secondary | ICD-10-CM

## 2013-01-03 DIAGNOSIS — B373 Candidiasis of vulva and vagina: Secondary | ICD-10-CM

## 2013-01-03 MED ORDER — FLUCONAZOLE 150 MG PO TABS: 150.0000 mg | ORAL_TABLET | Freq: Once | ORAL | Status: DC

## 2013-01-03 NOTE — Telephone Encounter (Signed)
Attempted to call patient. Phone rang and rang. No answer 

## 2013-01-03 NOTE — Telephone Encounter (Signed)
Only few yeasts on wet prep. Everything else is negative. Prescription for 1 time dose Diflucan sent to pharmacy.

## 2013-02-12 ENCOUNTER — Encounter: Payer: Self-pay | Admitting: Family Medicine

## 2013-02-12 ENCOUNTER — Ambulatory Visit (INDEPENDENT_AMBULATORY_CARE_PROVIDER_SITE_OTHER): Payer: Medicaid Other | Admitting: Family Medicine

## 2013-02-12 ENCOUNTER — Other Ambulatory Visit (HOSPITAL_COMMUNITY)
Admission: RE | Admit: 2013-02-12 | Discharge: 2013-02-12 | Disposition: A | Discharge status: Home / Self Care | Payer: Medicaid Other | Source: Ambulatory Visit | Attending: Family Medicine | Admitting: Family Medicine

## 2013-02-12 VITALS — BP 118/78 | HR 73 | Wt 160.0 lb

## 2013-02-12 DIAGNOSIS — B373 Candidiasis of vulva and vagina: Secondary | ICD-10-CM

## 2013-02-12 DIAGNOSIS — J302 Other seasonal allergic rhinitis: Secondary | ICD-10-CM

## 2013-02-12 DIAGNOSIS — Z9189 Other specified personal risk factors, not elsewhere classified: Secondary | ICD-10-CM

## 2013-02-12 DIAGNOSIS — J309 Allergic rhinitis, unspecified: Secondary | ICD-10-CM

## 2013-02-12 DIAGNOSIS — Z113 Encounter for screening for infections with a predominantly sexual mode of transmission: Secondary | ICD-10-CM | POA: Insufficient documentation

## 2013-02-12 DIAGNOSIS — Z7251 High risk heterosexual behavior: Secondary | ICD-10-CM | POA: Insufficient documentation

## 2013-02-12 DIAGNOSIS — B3731 Acute candidiasis of vulva and vagina: Secondary | ICD-10-CM

## 2013-02-12 DIAGNOSIS — Z202 Contact with and (suspected) exposure to infections with a predominantly sexual mode of transmission: Secondary | ICD-10-CM

## 2013-02-12 HISTORY — DX: High risk heterosexual behavior: Z72.51

## 2013-02-12 MED ORDER — FLUTICASONE PROPIONATE 50 MCG/ACT NA SUSP: 2.0000 | Freq: Every day | NASAL | Status: DC

## 2013-02-12 MED ORDER — CETIRIZINE HCL 10 MG PO TABS: 10.0000 mg | ORAL_TABLET | Freq: Every day | ORAL | Status: DC

## 2013-02-12 NOTE — Assessment & Plan Note (Signed)
RPR, HIV and GC and Chlamydia testing. Discussed with pt once more her high risk of  STD due to her sexual behavior. Pt says on her own words "if I get sick I get treated and that's it". Informed her about not curable conditions she is at risk of. Pt acknowledge this.

## 2013-02-12 NOTE — Progress Notes (Signed)
Family Medicine Office Visit Note   Subjective:   Patient ID: Alicia Dunlap, female  DOB: 28-Jul-1991, 22 y.o.. MRN: [id number]   Pt that comes today for same day appointment with concerns for STD's  after an unprotected sexual intercourse 2 weeks ago. She denies feeling ill,  vaginal discharge, fevers, chills or rashes.   Review of Systems:  Per HPI.  Objective:   Physical Exam: Gen:  NAD HEENT: Moist mucous membranes  CV: Regular rate and rhythm, no murmurs rubs or gallops PULM: Clear to auscultation bilaterally. No wheezes/rales/rhonchi ABD: Soft, non tender, non distended, normal bowel sounds EXT: No edema Neuro: Alert and oriented x3. No focalization SKin: warm and dry no rashes or lesions. Pt declines vaginal exam.  Assessment & Plan:

## 2013-02-12 NOTE — Patient Instructions (Addendum)
Sexually Transmitted Disease  A sexually transmitted disease (STD) is an infection that is passed from person to person during sexual activity. STDs can be spread by different types of germs (bacteria, viruses, parasites). An STD can be passed through:   Spit (saliva).   Semen.   Blood.   Mucus from the vagina.   Pee (urine).  HOME CARE    Tell your sex partner(s) that you have an STD. They should be tested and treated.   Take your medicine (antibiotics) as told. Finish them even if you start to feel better.   Only take medicines as told by your doctor.   Rest.   Eat a healthy diet. Drink enough fluids to keep your pee clear or pale yellow.   Do not have sex until treatment is finished. You must follow up with your doctor.   Keep all doctor visits, Pap tests, and blood tests as told by your doctor.   Only use condoms labeled "latex" and lubricants that wash away with water (water-soluble). Do not use petroleum jelly or oils.   Avoid alcohol and illegal drugs.   Get shots (vaccines) for HPV and hepatitis.   Avoid risky sex behavior that can break the skin.  GET HELP RIGHT AWAY IF:   You have a fever.   You have new problems, or your problems get worse.  MAKE SURE YOU:   Understand these instructions.   Will watch your condition.   Will get help right away if you are not doing well or get worse.  Document Released: 12/07/2004 Document Revised: 01/22/2012 Document Reviewed: 02/27/2011  [employer] Patient Information 2013 [employer].

## 2013-02-12 NOTE — Assessment & Plan Note (Signed)
Discussed in depth with pt. Please refer to A/P under unprotected sex on problem list for more details.

## 2013-02-24 ENCOUNTER — Ambulatory Visit (INDEPENDENT_AMBULATORY_CARE_PROVIDER_SITE_OTHER): Payer: Medicaid Other | Admitting: Family Medicine

## 2013-02-24 ENCOUNTER — Encounter: Payer: Self-pay | Admitting: Family Medicine

## 2013-02-24 VITALS — BP 116/74 | HR 91 | Temp 98.3°F | Wt 163.5 lb

## 2013-02-24 DIAGNOSIS — J302 Other seasonal allergic rhinitis: Secondary | ICD-10-CM

## 2013-02-24 DIAGNOSIS — M549 Dorsalgia, unspecified: Secondary | ICD-10-CM

## 2013-02-24 DIAGNOSIS — J309 Allergic rhinitis, unspecified: Secondary | ICD-10-CM

## 2013-02-24 DIAGNOSIS — R109 Unspecified abdominal pain: Secondary | ICD-10-CM

## 2013-02-24 LAB — POCT URINALYSIS DIPSTICK
Bilirubin, UA: NEGATIVE
Ketones, UA: NEGATIVE
Leukocytes, UA: NEGATIVE
Nitrite, UA: NEGATIVE
Protein, UA: NEGATIVE
pH, UA: 6.5

## 2013-02-24 MED ORDER — OLOPATADINE HCL 0.2 % OP SOLN: 1.0000 [drp] | Freq: Every day | OPHTHALMIC | Status: DC

## 2013-02-24 MED ORDER — CYCLOBENZAPRINE HCL 5 MG PO TABS: 5.0000 mg | ORAL_TABLET | Freq: Every day | ORAL | Status: DC

## 2013-02-24 NOTE — Patient Instructions (Addendum)
You can take Acetaminophen 650 mg every 8h alternating with Ibuprofen 400 mg as needed for pain/ I will call you if the urine analysis comes back abnormal, Make a follow up appointment in 2 weeks  Lumbosacral Strain Lumbosacral strain is one of the most common causes of back pain. There are many causes of back pain. Most are not serious conditions. CAUSES  Your backbone (spinal column) is made up of 24 main vertebral bodies, the sacrum, and the coccyx. These are held together by muscles and tough, fibrous tissue (ligaments). Nerve roots pass through the openings between the vertebrae. A sudden move or injury to the back may cause injury to, or pressure on, these nerves. This may result in localized back pain or pain movement (radiation) into the buttocks, down the leg, and into the foot. Sharp, shooting pain from the buttock down the back of the leg (sciatica) is frequently associated with a ruptured (herniated) disk. Pain may be caused by muscle spasm alone. Your caregiver can often find the cause of your pain by the details of your symptoms and an exam. In some cases, you may need tests (such as X-rays). Your caregiver will work with you to decide if any tests are needed based on your specific exam. HOME CARE INSTRUCTIONS   Avoid an underactive lifestyle. Active exercise, as directed by your caregiver, is your greatest weapon against back pain.  Avoid hard physical activities (tennis, racquetball, waterskiing) if you are not in proper physical condition for it. This may aggravate or create problems.  If you have a back problem, avoid sports requiring sudden body movements. Swimming and walking are generally safer activities.  Maintain good posture.  Avoid becoming overweight (obese).  Use bed rest for only the most extreme, sudden (acute) episode. Your caregiver will help you determine how much bed rest is necessary.  For acute conditions, you may put ice on the injured area.  Put ice in a  plastic bag.  Place a towel between your skin and the bag.  Leave the ice on for 15 to 20 minutes at a time, every 2 hours, or as needed.  After you are improved and more active, it may help to apply heat for 30 minutes before activities. See your caregiver if you are having pain that lasts longer than expected. Your caregiver can advise appropriate exercises or therapy if needed. With conditioning, most back problems can be avoided. SEEK IMMEDIATE MEDICAL CARE IF:   You have numbness, tingling, weakness, or problems with the use of your arms or legs.  You experience severe back pain not relieved with medicines.  There is a change in bowel or bladder control.  You have increasing pain in any area of the body, including your belly (abdomen).  You notice shortness of breath, dizziness, or feel faint.  You feel sick to your stomach (nauseous), are throwing up (vomiting), or become sweaty.  You notice discoloration of your toes or legs, or your feet get very cold.  Your back pain is getting worse.  You have a fever. MAKE SURE YOU:   Understand these instructions.  Will watch your condition.  Will get help right away if you are not doing well or get worse. Document Released: 08/09/2005 Document Revised: 01/22/2012 Document Reviewed: 01/29/2009 [facility] Patient Information 2013 [place], [place].

## 2013-02-26 DIAGNOSIS — G8929 Other chronic pain: Secondary | ICD-10-CM | POA: Insufficient documentation

## 2013-02-26 DIAGNOSIS — M549 Dorsalgia, unspecified: Secondary | ICD-10-CM | POA: Insufficient documentation

## 2013-02-26 NOTE — Progress Notes (Signed)
Family Medicine Office Visit Note   Subjective:   Patient ID: Alicia Dunlap, female  DOB: [birth year], 22 y.o.. MRN: [id number]   Pt that comes today complaining of exacerbation of her chronic back pain. She reports has had treatment with Percocet in the past and she is requesting this medication.  #1. Back pain: started 2 weeks ago, mostly on left side but some on the right. Does not radiate to the leg. Pain is constant 5/10 worsens at the end of the day and improved with rest. Has not tried any medication yet. Denies fever, chills, saddle anesthesia, urinary symptoms or BM changes.  #2. Seasonal allergies: ocular involvement and rhinorrhea. Pt has fake lashes that are new since last time evaluating her (02/12/13) 15 days ago. She reports has been using them for long time and has never given any problem. Her symptoms are itchiness and "wattery ayes". Denies pain, headaches or changes in vision.  Review of Systems:  Per HPI  Objective:   Physical Exam: Gen:  NAD HEENT: Moist mucous membranes. Clear rhinorrhea. Pale nasal mucosa. No polyps. EYES: bilateral conjunctival injection and clear discharge. Fake XL eyelashes with glue-like material surrounding upper eyelid.  CV: Regular rate and rhythm, no murmurs. PULM: Clear to auscultation bilaterally. ABD: Soft, non tender, non distended, normal bowel sounds. No CVA tenderness. EXT: No edema.  MSK: Normal ROM of back, hips and LE. No tenderness on spinal processes. Leg raise negative bilaterally. Normal pulses bilaterally. Normal reflexes. Neuro: Alert and oriented x3. No focalization  Assessment & Plan:

## 2013-02-26 NOTE — Assessment & Plan Note (Addendum)
Pt reports this is chronic in nature, does not feel different to her. No neurologic or vascular symptoms and signs on examination. Normal LE function, no tenderness on spinal processes. No fever or chills. Most likely muscular strain.  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  Recommend ice, NSAIDs, acetaminophen, muscular relaxant (low dose) and strengthening exercises.

## 2013-02-26 NOTE — Assessment & Plan Note (Addendum)
Hx of seasonal allergies that are recurrent now. P/ Antihistaminic and local steroid treament. Recommend to avoid using make up material right onto the eye lid at least during the exacerbation of her allergies.  F/u as needed

## 2013-06-06 ENCOUNTER — Ambulatory Visit: Payer: Medicaid Other | Admitting: Family Medicine

## 2013-06-26 ENCOUNTER — Ambulatory Visit (INDEPENDENT_AMBULATORY_CARE_PROVIDER_SITE_OTHER): Payer: Medicaid Other | Admitting: Family Medicine

## 2013-06-26 ENCOUNTER — Other Ambulatory Visit (HOSPITAL_COMMUNITY)
Admission: RE | Admit: 2013-06-26 | Discharge: 2013-06-26 | Disposition: A | Discharge status: Home / Self Care | Payer: Medicaid Other | Source: Ambulatory Visit | Attending: Family Medicine | Admitting: Family Medicine

## 2013-06-26 ENCOUNTER — Encounter: Payer: Self-pay | Admitting: Family Medicine

## 2013-06-26 VITALS — BP 117/79 | HR 74 | Temp 98.5°F | Ht 66.0 in | Wt 157.4 lb

## 2013-06-26 DIAGNOSIS — Z113 Encounter for screening for infections with a predominantly sexual mode of transmission: Secondary | ICD-10-CM | POA: Insufficient documentation

## 2013-06-26 DIAGNOSIS — N760 Acute vaginitis: Secondary | ICD-10-CM

## 2013-06-26 DIAGNOSIS — Z01419 Encounter for gynecological examination (general) (routine) without abnormal findings: Secondary | ICD-10-CM | POA: Insufficient documentation

## 2013-06-26 DIAGNOSIS — Z124 Encounter for screening for malignant neoplasm of cervix: Secondary | ICD-10-CM

## 2013-06-26 DIAGNOSIS — Z7251 High risk heterosexual behavior: Secondary | ICD-10-CM

## 2013-06-26 DIAGNOSIS — Z1151 Encounter for screening for human papillomavirus (HPV): Secondary | ICD-10-CM | POA: Insufficient documentation

## 2013-06-26 LAB — POCT WET PREP (WET MOUNT)

## 2013-06-26 MED ORDER — METRONIDAZOLE 500 MG PO TABS: 500.0000 mg | ORAL_TABLET | Freq: Two times a day (BID) | ORAL | Status: DC

## 2013-06-26 NOTE — Patient Instructions (Addendum)
I will call you with the labs results if they come back abnormal otherwise we will discuss them at your next appointment. Sexually Transmitted Disease Sexually transmitted disease (STD) refers to any infection that is passed from person to person during sexual activity. This may happen by way of saliva, semen, blood, vaginal mucus, or urine. Common STDs include:  Gonorrhea.  Chlamydia.  Syphilis.  HIV/AIDS.  Genital herpes.  Hepatitis B and C.  Trichomonas.  Human papillomavirus (HPV).  Pubic lice. CAUSES  An STD may be spread by bacteria, virus, or parasite. A person can get an STD by:  Sexual intercourse with an infected person.  Sharing sex toys with an infected person.  Sharing needles with an infected person.  Having intimate contact with the genitals, mouth, or rectal areas of an infected person. SYMPTOMS  Some people may not have any symptoms, but they can still pass the infection to others. Different STDs have different symptoms. Symptoms include:  Painful or bloody urination.  Pain in the pelvis, abdomen, vagina, anus, throat, or eyes.  Skin rash, itching, irritation, growths, or sores (lesions). These usually occur in the genital or anal area.  Abnormal vaginal discharge.  Penile discharge in men.  Soft, flesh-colored skin growths in the genital or anal area.  Fever.  Pain or bleeding during sexual intercourse.  Swollen glands in the groin area.  Yellow skin and eyes (jaundice). This is seen with hepatitis. DIAGNOSIS  To make a diagnosis, your caregiver may:  Take a medical history.  Perform a physical exam.  Take a specimen (culture) to be examined.  Examine a sample of discharge under a microscope.  Perform blood tests.  Perform a Pap test, if this applies.  Perform a colposcopy.  Perform a laparoscopy. TREATMENT   Chlamydia, gonorrhea, trichomonas, and syphilis can be cured with antibiotic medicine.  Genital herpes, hepatitis,  and HIV can be treated, but not cured, with prescribed medicines. The medicines will lessen the symptoms.  Genital warts from HPV can be treated with medicine or by freezing, burning (electrocautery), or surgery. Warts may come back.  HPV is a virus and cannot be cured with medicine or surgery.However, abnormal areas may be followed very closely by your caregiver and may be removed from the cervix, vagina, or vulva through office procedures or surgery. If your diagnosis is confirmed, your recent sexual partners need treatment. This is true even if they are symptom-free or have a negative culture or evaluation. They should not have sex until their caregiver says it is okay. HOME CARE INSTRUCTIONS  All sexual partners should be informed, tested, and treated for all STDs.  Take your antibiotics as directed. Finish them even if you start to feel better.  Only take over-the-counter or prescription medicines for pain, discomfort, or fever as directed by your caregiver.  Rest.  Eat a balanced diet and drink enough fluids to keep your urine clear or pale yellow.  Do not have sex until treatment is completed and you have followed up with your caregiver. STDs should be checked after treatment.  Keep all follow-up appointments, Pap tests, and blood tests as directed by your caregiver.  Only use latex condoms and water-soluble lubricants during sexual activity. Do not use petroleum jelly or oils.  Avoid alcohol and illegal drugs.  Get vaccinated for HPV and hepatitis. If you have not received these vaccines in the past, talk to your caregiver about whether one or both might be right for you.  Avoid risky sex practices  that can break the skin. The only way to avoid getting an STD is to avoid all sexual activity.Latex condoms and dental dams (for oral sex) will help lessen the risk of getting an STD, but will not completely eliminate the risk. SEEK MEDICAL CARE IF:   You have a fever.  You have  any new or worsening symptoms. Document Released: 01/20/2003 Document Revised: 01/22/2012 Document Reviewed: 01/27/2011 [facility] Patient Information 2014 [place], [place].

## 2013-06-26 NOTE — Progress Notes (Signed)
Family Medicine Office Visit Note   Subjective:   Patient ID: Alicia Dunlap, female  DOB: 1991/11/08, 22 y.o.. MRN: [id number]   Pt that comes today complaining of vaginal discharge. She has been living in [place] with a new partner but has ended this relationship and now has noticed a foul smelling vaginal discharge that started 4 days ago. Denies pruritus or pain. She also would like to be tested for HIV and Syphilis and requests pharyngeal cultures for GC since she reports sore throat and has history of unprotected sexual intercourse with oral sex.   Review of Systems:  Pt denies SOB, chest pain, palpitations, headaches, dizziness, numbness or weakness. No changes on urinary or BM habits. No unintentional weigh loss/gain.  Objective:   Physical Exam: Gen:  NAD HEENT: mild erythema, no exudates, no maxillary or cervical adenopathies.  Vulva and perianal area: Normal  Speculum: Vagina and cervix of normal appearance, no friability, grayish abundant discharge. Pap smear was collected.  Bimanual exam: Uterus anteverted, no adnexal masses. No cervical motion tenderness. Skin: no rashes Extremities: no joint erythema or edema Neuro: oriented x 3. No focalization  Assessment & Plan:

## 2013-06-26 NOTE — Assessment & Plan Note (Signed)
Pt continues with risk sexual behavior. We continue counseling. HIV, RPR, wet prep, GC and CT as well as pharyngeal culture(this last one per pt's request) has been obtained today. P/ Will assess as the results are back.

## 2013-06-27 ENCOUNTER — Telehealth: Payer: Self-pay | Admitting: Family Medicine

## 2013-06-27 LAB — RPR

## 2013-06-27 NOTE — Telephone Encounter (Signed)
Called pt and left voicemail informing positive results of wet prep (did not mentioned was BV due to HIPPA) I have placed medication for her to take in the pharmacy (metronidazole). Will continue to monitor results of rest of tests and will develop plan accordingly.  Signed: D. Piloto Rolene Arbour, MD Family Medicine  PGY-3

## 2013-06-27 NOTE — Telephone Encounter (Signed)
Pt returned drs call °

## 2013-06-29 LAB — GONOCOCCUS CULTURE: Organism ID, Bacteria: NO GROWTH

## 2013-07-15 ENCOUNTER — Ambulatory Visit: Payer: Medicaid Other | Admitting: Family Medicine

## 2013-08-05 ENCOUNTER — Emergency Department (INDEPENDENT_AMBULATORY_CARE_PROVIDER_SITE_OTHER)
Admission: EM | Admit: 2013-08-05 | Discharge: 2013-08-05 | Disposition: A | Discharge status: Home / Self Care | Payer: Medicaid Other | Source: Home / Self Care | Attending: Emergency Medicine | Admitting: Emergency Medicine

## 2013-08-05 ENCOUNTER — Encounter (HOSPITAL_COMMUNITY): Payer: Self-pay | Admitting: *Deleted

## 2013-08-05 ENCOUNTER — Other Ambulatory Visit (HOSPITAL_COMMUNITY)
Admission: RE | Admit: 2013-08-05 | Discharge: 2013-08-05 | Disposition: A | Discharge status: Home / Self Care | Payer: Medicaid Other | Source: Ambulatory Visit | Attending: Emergency Medicine | Admitting: Emergency Medicine

## 2013-08-05 DIAGNOSIS — N760 Acute vaginitis: Secondary | ICD-10-CM | POA: Insufficient documentation

## 2013-08-05 DIAGNOSIS — N949 Unspecified condition associated with female genital organs and menstrual cycle: Secondary | ICD-10-CM

## 2013-08-05 DIAGNOSIS — R102 Pelvic and perineal pain: Secondary | ICD-10-CM

## 2013-08-05 DIAGNOSIS — Z113 Encounter for screening for infections with a predominantly sexual mode of transmission: Secondary | ICD-10-CM | POA: Insufficient documentation

## 2013-08-05 LAB — POCT URINALYSIS DIP (DEVICE)
Hgb urine dipstick: NEGATIVE
Ketones, ur: NEGATIVE mg/dL
Protein, ur: NEGATIVE mg/dL
Urobilinogen, UA: 1 mg/dL (ref 0.0–1.0)
pH: 6 (ref 5.0–8.0)

## 2013-08-05 MED ORDER — CEFTRIAXONE SODIUM 250 MG IJ SOLR
INTRAMUSCULAR | Status: AC
  Filled 2013-08-05: qty 250

## 2013-08-05 MED ORDER — AZITHROMYCIN 250 MG PO TABS
1000.0000 mg | ORAL_TABLET | Freq: Once | ORAL | Status: AC
  Administered 2013-08-05: 1000 mg via ORAL

## 2013-08-05 MED ORDER — LIDOCAINE HCL (PF) 1 % IJ SOLN
INTRAMUSCULAR | Status: AC
  Filled 2013-08-05: qty 5

## 2013-08-05 MED ORDER — DOXYCYCLINE HYCLATE 100 MG PO TABS: 100.0000 mg | ORAL_TABLET | Freq: Two times a day (BID) | ORAL | Status: DC

## 2013-08-05 MED ORDER — HYDROCODONE-ACETAMINOPHEN 5-325 MG PO TABS: ORAL_TABLET | ORAL | Status: DC

## 2013-08-05 MED ORDER — HYDROCODONE-ACETAMINOPHEN 5-325 MG PO TABS
ORAL_TABLET | ORAL | Status: AC
  Filled 2013-08-05: qty 2

## 2013-08-05 MED ORDER — HYDROCODONE-ACETAMINOPHEN 5-325 MG PO TABS
2.0000 | ORAL_TABLET | Freq: Once | ORAL | Status: AC
  Administered 2013-08-05: 2 via ORAL

## 2013-08-05 MED ORDER — AZITHROMYCIN 250 MG PO TABS
ORAL_TABLET | ORAL | Status: AC
  Filled 2013-08-05: qty 4

## 2013-08-05 MED ORDER — CEFTRIAXONE SODIUM 250 MG IJ SOLR
250.0000 mg | Freq: Once | INTRAMUSCULAR | Status: AC
  Administered 2013-08-05: 250 mg via INTRAMUSCULAR

## 2013-08-05 MED ORDER — METRONIDAZOLE 500 MG PO TABS: 500.0000 mg | ORAL_TABLET | Freq: Three times a day (TID) | ORAL | Status: DC

## 2013-08-05 NOTE — ED Notes (Signed)
Pt reports having pain 9/10 in lower right abdomen.  Pt denies any vaginal bleeding.

## 2013-08-05 NOTE — ED Provider Notes (Signed)
Chief Complaint:   Chief Complaint  Patient presents with  . Abdominal Pain    Right sided    History of Present Illness:   Alicia Dunlap is a 22 year old female who's had a two-day history of pelvic pain worse on the right than the left which radiates to the back. This is a constant pain rated 9/10 in intensity. It's worse if she lies flat, lies on her right side, or stands. Nothing seems to help the pain. She's had some chills, urinary frequency, and vaginal itching. She denies any fever, nausea, vomiting, dysuria, hematuria, vaginal discharge, odor. She has an Implanad for birth control. Her last menstrual period was in July. She is sexually active. She has a history of bacterial vaginosis.  Review of Systems:  Other than noted above, the patient denies any of the following symptoms: Systemic:  No fever, chills, sweats, or weight loss. GI:  No abdominal pain, nausea, anorexia, vomiting, diarrhea, constipation, melena or hematochezia. GU:  No dysuria, frequency, urgency, hematuria, vaginal discharge, itching, or abnormal vaginal bleeding. Skin:  No rash or itching.  PMFSH:  Past medical history, family history, social history, meds, and allergies were reviewed.  She is allergic to amoxicillin which has caused a rash but as far she knows she is able to take cephalosporins.  Physical Exam:   Vital signs:  BP 145/75  Pulse 106  Temp(Src) 98.4 F (36.9 C) (Oral)  SpO2 100%  LMP 06/04/2013 General:  Alert, oriented and in no distress. Lungs:  Breath sounds clear and equal bilaterally.  No wheezes, rales or rhonchi. Heart:  Regular rhythm.  No gallops or murmers. Abdomen:  Soft, flat and non-distended.  No organomegaly or mass.  No tenderness, guarding or rebound.  Bowel sounds normally active. Pelvic exam:  Normal external genitalia. There is a thick, white, malodorous vaginal discharge. She has moderate pain on cervical motion. Uterus is posterior, normal in size and shape and mildly  tender. She has moderate bilateral adnexal tenderness without any masses. DNA probes for gonorrhea, Chlamydia, Trichomonas, Gardnerella, Candida were obtained. Skin:  Clear, warm and dry.  Labs:   Results for orders placed during the hospital encounter of 08/05/13  POCT URINALYSIS DIP (DEVICE)      Result Value Range   Glucose, UA NEGATIVE  NEGATIVE mg/dL   Bilirubin Urine NEGATIVE  NEGATIVE   Ketones, ur NEGATIVE  NEGATIVE mg/dL   Specific Gravity, Urine 1.025  1.005 - 1.030   Hgb urine dipstick NEGATIVE  NEGATIVE   pH 6.0  5.0 - 8.0   Protein, ur NEGATIVE  NEGATIVE mg/dL   Urobilinogen, UA 1.0  0.0 - 1.0 mg/dL   Nitrite NEGATIVE  NEGATIVE   Leukocytes, UA TRACE (*) NEGATIVE  POCT PREGNANCY, URINE      Result Value Range   Preg Test, Ur NEGATIVE  NEGATIVE     Course in Urgent Care Center:   For pain she was given Norco 5/325 2 tablets. She was given Rocephin 250 mg IM and azithromycin 1000 mg by mouth.  Assessment:  The encounter diagnosis was Pelvic pain.  Probably PID, although differential diagnosis includes ruptured ovarian cyst, fibroid tumors, endometriosis, and there is no evidence of appendicitis.  Plan:   1.  Meds:  The following meds were prescribed:   Discharge Medication List as of 08/05/2013  2:47 PM    START taking these medications   Details  doxycycline (VIBRA-TABS) 100 MG tablet Take 1 tablet (100 mg total) by mouth 2 (two)  times daily., Starting 08/05/2013, Until Discontinued, Normal    HYDROcodone-acetaminophen (NORCO/VICODIN) 5-325 MG per tablet 1 to 2 tabs every 4 to 6 hours as needed for pain., Print    metroNIDAZOLE (FLAGYL) 500 MG tablet Take 1 tablet (500 mg total) by mouth 3 (three) times daily., Starting 08/05/2013, Until Discontinued, Normal        2.  Patient Education/Counseling:  The patient was given appropriate handouts, self care instructions, and instructed in symptomatic relief.  Avoid intercourse until pain is resolved.  3.  Follow up:   The patient was told to follow up if no better in 3 to 4 days, if becoming worse in any way, and given some red flag symptoms such as fever, persistent vomiting, or worsening pain which would prompt immediate return.  Follow up at [facility] within the next 48 hours.     Reuben Likes, MD 08/05/13 2159

## 2013-08-06 ENCOUNTER — Encounter: Payer: Self-pay | Admitting: Family Medicine

## 2013-08-08 ENCOUNTER — Encounter (HOSPITAL_COMMUNITY): Payer: Self-pay | Admitting: *Deleted

## 2013-08-09 ENCOUNTER — Inpatient Hospital Stay (HOSPITAL_COMMUNITY)
Admission: AD | Admit: 2013-08-09 | Discharge: 2013-08-09 | Disposition: A | Discharge status: Home / Self Care | Payer: Medicaid Other | Source: Ambulatory Visit | Attending: Obstetrics & Gynecology | Admitting: Obstetrics & Gynecology

## 2013-08-09 ENCOUNTER — Encounter (HOSPITAL_COMMUNITY): Payer: Self-pay | Admitting: *Deleted

## 2013-08-09 ENCOUNTER — Inpatient Hospital Stay (HOSPITAL_COMMUNITY): Payer: Medicaid Other

## 2013-08-09 DIAGNOSIS — R1031 Right lower quadrant pain: Secondary | ICD-10-CM | POA: Insufficient documentation

## 2013-08-09 DIAGNOSIS — N949 Unspecified condition associated with female genital organs and menstrual cycle: Secondary | ICD-10-CM | POA: Insufficient documentation

## 2013-08-09 DIAGNOSIS — N83209 Unspecified ovarian cyst, unspecified side: Secondary | ICD-10-CM | POA: Insufficient documentation

## 2013-08-09 DIAGNOSIS — N739 Female pelvic inflammatory disease, unspecified: Secondary | ICD-10-CM | POA: Insufficient documentation

## 2013-08-09 DIAGNOSIS — R102 Pelvic and perineal pain: Secondary | ICD-10-CM

## 2013-08-09 HISTORY — DX: Other specified bacterial agents as the cause of diseases classified elsewhere: B96.89

## 2013-08-09 HISTORY — DX: Acute vaginitis: N76.0

## 2013-08-09 LAB — URINALYSIS, ROUTINE W REFLEX MICROSCOPIC
Bilirubin Urine: NEGATIVE
Hgb urine dipstick: NEGATIVE
Specific Gravity, Urine: >1.03 — ABNORMAL HIGH (ref 1.005–1.030)
Urobilinogen, UA: 0.2 mg/dL (ref 0.0–1.0)

## 2013-08-09 LAB — URINE MICROSCOPIC-ADD ON

## 2013-08-09 MED ORDER — METRONIDAZOLE 0.75 % VA GEL: 1.0000 | Freq: Every day | VAGINAL | Status: DC

## 2013-08-09 MED ORDER — IBUPROFEN 600 MG PO TABS: 600.0000 mg | ORAL_TABLET | Freq: Four times a day (QID) | ORAL | Status: DC | PRN

## 2013-08-09 NOTE — MAU Provider Note (Signed)
Chief Complaint: "pains in ovaries"    First Provider Initiated Contact with Patient 08/09/13 1134     SUBJECTIVE HPI: Alicia Dunlap is a 22 y.o. G1P1001 who presents to maternity admissions reporting pain in her ovaries.  She reports sharp intermittent pain mostly in RLQ with onset 2 weeks ago. She saw Urgent Care on 9/23 and was treated for PID.  She is currently taking Doxycycline and Flagyl.  She has difficulty with the Flagyl which is ordered three times daily but is able to take Doxycycline without problems.  She has Nexplanon for contraception and had spotting 2 months ago, which is her normal pattern since the implant.  She denies vaginal bleeding, vaginal itching/burning, urinary symptoms, h/a, dizziness, n/v, or fever/chills.    Past Medical History  Diagnosis Date  . Bacterial vaginosis    History reviewed. No pertinent past surgical history. History   Social History  . Marital Status: Single    Spouse Name: N/A    Number of Children: N/A  . Years of Education: N/A   Occupational History  . Not on file.   Social History Main Topics  . Smoking status: Never Smoker   . Smokeless tobacco: Not on file  . Alcohol Use: No  . Drug Use: No  . Sexual Activity: No   Other Topics Concern  . Not on file   Social History Narrative   ** Merged History Encounter **       No current facility-administered medications on file prior to encounter.   Current Outpatient Prescriptions on File Prior to Encounter  Medication Sig Dispense Refill  . doxycycline (VIBRA-TABS) 100 MG tablet Take 1 tablet (100 mg total) by mouth 2 (two) times daily.  20 tablet  0  . HYDROcodone-acetaminophen (NORCO/VICODIN) 5-325 MG per tablet 1 to 2 tabs every 4 to 6 hours as needed for pain.  20 tablet  0  . metroNIDAZOLE (FLAGYL) 500 MG tablet Take 1 tablet (500 mg total) by mouth 3 (three) times daily.  30 tablet  0   Allergies  Allergen Reactions  . Amoxicillin Rash    ROS: Pertinent items in  HPI  OBJECTIVE Blood pressure 118/72, pulse 86, temperature 98.4 F (36.9 C), temperature source Oral, resp. rate 18, height 5\' 6"  (1.676 m), weight 70.67 kg (155 lb 12.8 oz), last menstrual period 06/04/2013. GENERAL: Well-developed, well-nourished female in no acute distress.  HEENT: Normocephalic HEART: normal rate RESP: normal effort ABDOMEN: Soft, non-tender EXTREMITIES: Nontender, no edema NEURO: Alert and oriented SPECULUM EXAM: Deferred--done 9/23, positive for BV, negative GCC at that time  LAB RESULTS Results for orders placed during the hospital encounter of 08/09/13 (from the past 24 hour(s))  POCT PREGNANCY, URINE     Status: None   Collection Time    08/09/13 11:17 AM      Result Value Range   Preg Test, Ur NEGATIVE  NEGATIVE    IMAGING US Transvaginal Non-ob  08/09/2013   CLINICAL DATA:  Pelvic pain, evaluate for torsion  EXAM: TRANSABDOMINAL AND TRANSVAGINAL ULTRASOUND OF PELVIS  DOPPLER ULTRASOUND OF OVARIES  TECHNIQUE: Both transabdominal and transvaginal ultrasound examinations of the pelvis were performed. Transabdominal technique was performed for global imaging of the pelvis including uterus, ovaries, adnexal regions, and pelvic cul-de-sac.  It was necessary to proceed with endovaginal exam following the transabdominal exam to visualize the left ovary. Color and duplex Doppler ultrasound was utilized to evaluate blood flow to the ovaries.  COMPARISON:  None.  FINDINGS: Uterus  Measurements: 7.3 x 3.6 x 3.9 cm. No fibroids or other mass visualized.  Endometrium  Thickness: 6 mm.  No focal abnormality visualized.  Right ovary  Measurements: 3.4 x 1.8 x 2.1 cm. Normal appearance/no adnexal mass.  Left ovary  Measurements: 3.6 x 1.8 x 3.8 cm. Notable for a 1.2 x 2.0 x 1.7 cm hemorrhagic cyst.  Pulsed Doppler evaluation of both ovaries demonstrates normal low-resistance arterial and venous waveforms.  IMPRESSION: Negative pelvic ultrasound.  2.0 cm left ovarian hemorrhagic  cyst.  No sonographic evidence for ovarian torsion.   Electronically Signed   By: Charline Bills M.D.   On: 08/09/2013 12:27    ASSESSMENT 1. Pain, pelvic, female     PLAN Discharge home Continue Doxycycline for tx of PID Discontinue Flagyl at this time Take Ibuprofen 600 mg Q 6 hours F/U in WOC or MAU as needed    Medication List    ASK your doctor about these medications       doxycycline 100 MG tablet  Commonly known as:  VIBRA-TABS  Take 1 tablet (100 mg total) by mouth 2 (two) times daily.     HYDROcodone-acetaminophen 5-325 MG per tablet  Commonly known as:  NORCO/VICODIN  1 to 2 tabs every 4 to 6 hours as needed for pain.     metroNIDAZOLE 500 MG tablet  Commonly known as:  FLAGYL  Take 1 tablet (500 mg total) by mouth 3 (three) times daily.         Sharen Counter Certified Nurse-Midwife 08/09/2013  11:36 AM

## 2013-08-09 NOTE — MAU Note (Addendum)
States was seen at Urgent Care 4 days ago. States was given 2 medications for an infection in her fallopian tubes and some pain medication. States medication is not helping. Was told if she continued to have sxs, to come to [facility] for an U/S. States she has pain all across lower abdomen, but more on R.

## 2013-08-12 NOTE — MAU Provider Note (Signed)
Attestation of Attending Supervision of Advanced Practitioner (CNM/NP): Evaluation and management procedures were performed by the Advanced Practitioner under my supervision and collaboration. I have reviewed the Advanced Practitioner's note and chart, and I agree with the management and plan.  Mialynn Shelvin H. 10:10 AM   

## 2013-08-13 NOTE — ED Notes (Signed)
GC/Chlamydia neg., Affirm: Candida and Trich neg., Gardnerella pos. Pt. adequately treated with Metrogel. Vassie Moselle 08/13/2013

## 2013-09-03 ENCOUNTER — Telehealth: Payer: Self-pay | Admitting: Family Medicine

## 2013-09-03 NOTE — Telephone Encounter (Signed)
Please advise. Alicia Dunlap S  

## 2013-09-03 NOTE — Telephone Encounter (Signed)
Pt is calling because she has been on her period for two weeks now and she takes [facility] pills. She wanted to know what that could be. I asked her if she wanted an appointment but she will wait until someone calls her. JW

## 2013-09-04 NOTE — Telephone Encounter (Signed)
Pt needs appointment for evaluation.

## 2013-09-08 NOTE — Telephone Encounter (Signed)
Unable to reach patient ,both phone numbers have no voicemail. Alicia Dunlap, Alicia Dunlap

## 2013-09-18 ENCOUNTER — Other Ambulatory Visit: Payer: Self-pay

## 2013-09-26 ENCOUNTER — Ambulatory Visit: Payer: Medicaid Other | Admitting: Family Medicine

## 2013-10-27 ENCOUNTER — Ambulatory Visit: Payer: Medicaid Other | Admitting: Family Medicine

## 2013-11-03 ENCOUNTER — Ambulatory Visit (INDEPENDENT_AMBULATORY_CARE_PROVIDER_SITE_OTHER): Payer: Medicaid Other | Admitting: Family Medicine

## 2013-11-03 ENCOUNTER — Encounter: Payer: Self-pay | Admitting: Family Medicine

## 2013-11-03 VITALS — BP 127/78 | HR 80 | Temp 99.0°F | Ht 66.0 in | Wt 143.1 lb

## 2013-11-03 DIAGNOSIS — N912 Amenorrhea, unspecified: Secondary | ICD-10-CM

## 2013-11-03 DIAGNOSIS — IMO0002 Reserved for concepts with insufficient information to code with codable children: Secondary | ICD-10-CM

## 2013-11-03 DIAGNOSIS — S3141XS Laceration without foreign body of vagina and vulva, sequela: Secondary | ICD-10-CM

## 2013-11-03 LAB — POCT URINE PREGNANCY: Preg Test, Ur: NEGATIVE

## 2013-11-03 NOTE — Progress Notes (Signed)
Family Medicine Office Visit Note   Subjective:   Patient ID: Alicia Dunlap, female  DOB: 10/06/1991, 22 y.o.. MRN: [id number]   Pt that comes today complaining of pain with sexual intercourse. She reports 2 weeks ago after she had sex she felt something was "wrong down there". She described as something rip and hurt. She use acuafor and other humectant creams and this easy off the pain. She reports about 2 days ago she had sexual intercourse and happened again. She denies unrelated sexual activity pain, discharge.  Her other concern is for pregnancy. She has Nexplanon since 06/2011, but reports feeling abdominal discomfort and feeling as she were pregnant. She has no periods so reports can not tell she is pregnant or no.  Review of Systems:  Pt denies fever, chills, nausea, vomiting or other symptoms.   Objective:   Physical Exam: Gen:  NAD HEENT: Moist mucous membranes  CV: Regular rate and rhythm, no murmurs rubs or gallops PULM: Clear to auscultation bilaterally. No wheezes/rales/rhonchi ABD: Soft, non tender, non distended, normal bowel sounds Vulva and perianal area: Normal  Speculum: Vagina with several 1-2 laceration on her distal portion of introitus. Bimanual exam: Uterus anteverted, no adnexal masses. No cervical motion tenderness.  Assessment & Plan:

## 2013-11-04 DIAGNOSIS — S3141XA Laceration without foreign body of vagina and vulva, initial encounter: Secondary | ICD-10-CM | POA: Insufficient documentation

## 2013-11-04 NOTE — Assessment & Plan Note (Signed)
Very small. Instructed to use lubrication when engaged in sexual intercourse. F/u if needed.

## 2013-11-04 NOTE — Assessment & Plan Note (Signed)
Likely secondary to Nexplanon. Pregnancy test is negative.

## 2013-11-04 NOTE — Patient Instructions (Signed)
You can use lubrication or prolong excitatory phase when having sex. Pregnancy teste  Was negative today. F/u as needed.

## 2013-11-10 ENCOUNTER — Ambulatory Visit: Payer: Medicaid Other | Admitting: Family Medicine

## 2014-04-15 ENCOUNTER — Ambulatory Visit (INDEPENDENT_AMBULATORY_CARE_PROVIDER_SITE_OTHER): Payer: Self-pay | Admitting: Family Medicine

## 2014-04-15 ENCOUNTER — Other Ambulatory Visit (HOSPITAL_COMMUNITY)
Admission: RE | Admit: 2014-04-15 | Discharge: 2014-04-15 | Disposition: A | Discharge status: Home / Self Care | Payer: Medicaid Other | Source: Ambulatory Visit | Attending: Family Medicine | Admitting: Family Medicine

## 2014-04-15 ENCOUNTER — Encounter: Payer: Self-pay | Admitting: Family Medicine

## 2014-04-15 VITALS — BP 116/80 | HR 84 | Temp 98.6°F | Wt 159.0 lb

## 2014-04-15 DIAGNOSIS — B9689 Other specified bacterial agents as the cause of diseases classified elsewhere: Secondary | ICD-10-CM

## 2014-04-15 DIAGNOSIS — B3731 Acute candidiasis of vulva and vagina: Secondary | ICD-10-CM

## 2014-04-15 DIAGNOSIS — Z202 Contact with and (suspected) exposure to infections with a predominantly sexual mode of transmission: Secondary | ICD-10-CM | POA: Insufficient documentation

## 2014-04-15 DIAGNOSIS — N760 Acute vaginitis: Secondary | ICD-10-CM

## 2014-04-15 DIAGNOSIS — N898 Other specified noninflammatory disorders of vagina: Secondary | ICD-10-CM | POA: Insufficient documentation

## 2014-04-15 DIAGNOSIS — Z113 Encounter for screening for infections with a predominantly sexual mode of transmission: Secondary | ICD-10-CM | POA: Insufficient documentation

## 2014-04-15 DIAGNOSIS — A499 Bacterial infection, unspecified: Secondary | ICD-10-CM

## 2014-04-15 DIAGNOSIS — B373 Candidiasis of vulva and vagina: Secondary | ICD-10-CM

## 2014-04-15 MED ORDER — METRONIDAZOLE 500 MG PO TABS: 500.0000 mg | ORAL_TABLET | Freq: Three times a day (TID) | ORAL | Status: DC

## 2014-04-15 MED ORDER — FLUCONAZOLE 150 MG PO TABS
150.0000 mg | ORAL_TABLET | Freq: Once | ORAL | Status: DC
Start: 2014-04-15 — End: 2014-04-15

## 2014-04-15 MED ORDER — FLUCONAZOLE 150 MG PO TABS: 150.0000 mg | ORAL_TABLET | Freq: Once | ORAL | Status: DC

## 2014-04-15 NOTE — Patient Instructions (Signed)
Thank you for coming in, today!  We will treat you for a yeast infection and bacterial vaginosis. I will call you or send you a letter with the results for your other tests for sexually-transmitted infections. I will send Dr. Aviva Signs my note so she knows what all we did, today.  You can follow up with Dr. Aviva Signs as needed. Please feel free to call with any questions or concerns at any time, at [phone]. --Dr. Casper Harrison  Bacterial Vaginosis Bacterial vaginosis is an infection of the vagina. It happens when too many of certain germs (bacteria) grow in the vagina. HOME CARE  Take your medicine as told by your doctor.  Finish your medicine even if you start to feel better.  Do not have sex until you finish your medicine and are better.  Tell your sex partner that you have an infection. They should see their doctor for treatment.  Practice safe sex. Use condoms. Have only one sex partner. GET HELP IF:  You are not getting better after 3 days of treatment.  You have more grey fluid (discharge) coming from your vagina than before.  You have more pain than before.  You have a fever. MAKE SURE YOU:   Understand these instructions.  Will watch your condition.  Will get help right away if you are not doing well or get worse. Document Released: 08/08/2008 Document Revised: 08/20/2013 Document Reviewed: 06/11/2013 [facility] Patient Information 2014 [place], [place].

## 2014-04-15 NOTE — Assessment & Plan Note (Signed)
Pt with recent unprotected sex with a partner who may have had unprotected sex with another / unknown party. Current symptoms and exam suggestive of BV and yeast infection; treating for both of these, see separate problem list notes. Pt also requests full STI check; GC/Chlamydia, HIV, and RPR samples obtained, today. Will contact pt with these results and treat as appropriate. Pt to f/u with PCP as needed.

## 2014-04-15 NOTE — Assessment & Plan Note (Signed)
Pt with history of vaginal discharge and relatively recent treatment for BV with similar symptoms; frank fishy odor on exam, but no wet prep obtained due to extent of blood in vaginal vault (irregular / unpredictable bleeding "menses" in pt with Implanon). Will treat for presumptive BV with Flagyl 500 mg TID for 7 days; discussed abstaining from alcohol while on this medication. Follow up as needed; return precautions discussed. Also treating for yeast infection and testing for STI's; see separate problem list notes.

## 2014-04-15 NOTE — Progress Notes (Signed)
   Subjective:    Patient ID: Alicia Dunlap, female    DOB: 17-Mar-1991, 23 y.o.   MRN: [id number]  HPI: Pt presents to clinic for SDA for vaginal discharge for 3 days. She has Implanon and started having some bleeding 4 days ago; she states she used scented tampons starting 3 days ago, and is uncertain if this could be related to her symptoms. She has itching, burning, not worse when she urinates, and denies pain with urination. The discharge is described as whitish clumps with some blood mixed in. She is sexually active with no new partners; she recently broke up with her partner, who may have had had unprotected sex before the last time pt and he had intercourse for the last time. She has had some cramping abdominal pain off and on, with the bleeding. She denies blisters / bumps in her vaginal area.  She has a remote history of trichomonas and Chlamydia at different times. She is interested in a full STI check.  Review of Systems: As above.     Objective:   Physical Exam BP 116/80  Pulse 84  Temp(Src) 98.6 F (37 C) (Oral)  Wt 159 lb (72.122 kg) Gen: well-appearing young adult female in NAD Cardio: RRR, no murmur appreciated Pulm: CTAB, no wheezes Abd: soft, nontender, BS+ GU: external vaginal / vulvar structures normal; discharge notable for strong fishy odor  Speculum exam: curd-like white vaginal discharge and gross blood present in vault; no obvious cervical lesions  Bimanual exam: diffuse tenderness in adnexae, no definite CMT or fundal tenderness     Assessment & Plan:

## 2014-04-15 NOTE — Assessment & Plan Note (Addendum)
History and exam strongly suggestive of yeast infection and likely BV; planned wet prep but gross blood present to such an extent as to likely cloud results. ROS and exam not suggestive of frank PID or similar process. Will treat for BV and yeast, and test for STI's. See separate problem list notes.

## 2014-04-15 NOTE — Assessment & Plan Note (Signed)
History and physical exam strongly suggestive of vaginal yeast infection. Plan to treat with Diflucan x1. Also suggestive of BV; will treat for that, as well, see separate problem list note. Return precautions discussed in detail. F/u as needed.

## 2014-04-16 ENCOUNTER — Encounter: Payer: Self-pay | Admitting: Family Medicine

## 2014-04-16 LAB — HIV ANTIBODY (ROUTINE TESTING W REFLEX): HIV 1&2 Ab, 4th Generation: NONREACTIVE

## 2014-04-16 LAB — RPR

## 2014-05-02 ENCOUNTER — Encounter (HOSPITAL_COMMUNITY): Payer: Self-pay | Admitting: Emergency Medicine

## 2014-05-02 ENCOUNTER — Emergency Department (INDEPENDENT_AMBULATORY_CARE_PROVIDER_SITE_OTHER)
Admission: EM | Admit: 2014-05-02 | Discharge: 2014-05-02 | Disposition: A | Discharge status: Home / Self Care | Payer: Self-pay | Source: Home / Self Care | Attending: Family Medicine | Admitting: Family Medicine

## 2014-05-02 DIAGNOSIS — B9689 Other specified bacterial agents as the cause of diseases classified elsewhere: Secondary | ICD-10-CM

## 2014-05-02 DIAGNOSIS — N760 Acute vaginitis: Secondary | ICD-10-CM

## 2014-05-02 DIAGNOSIS — T148 Other injury of unspecified body region: Secondary | ICD-10-CM

## 2014-05-02 DIAGNOSIS — W57XXXA Bitten or stung by nonvenomous insect and other nonvenomous arthropods, initial encounter: Secondary | ICD-10-CM

## 2014-05-02 DIAGNOSIS — A499 Bacterial infection, unspecified: Secondary | ICD-10-CM

## 2014-05-02 MED ORDER — METRONIDAZOLE 500 MG PO TABS: 500.0000 mg | ORAL_TABLET | Freq: Two times a day (BID) | ORAL | Status: DC

## 2014-05-02 MED ORDER — TRIAMCINOLONE ACETONIDE 0.1 % EX CREA: 1.0000 "application " | TOPICAL_CREAM | Freq: Two times a day (BID) | CUTANEOUS | Status: DC

## 2014-05-02 NOTE — ED Notes (Signed)
Patient complains of "bed bug" bites that she noticed 3 nights ago; states she slept in a supper 8 with sister; states sister saw the bugs in the bed.

## 2014-05-02 NOTE — ED Provider Notes (Signed)
Alicia Dunlap is a 23 y.o. female who presents to Urgent Care today for bug bites starting 2 days ago after patient slept in a hotel. No does has a similar rash. The rash is intensely pruritic. Patient has tried alcohol wipes which have not helped. No fevers or chills nausea vomiting or diarrhea. Patient notes that she was recently diagnosed with atrial vaginosis by her primary care Dr. She was prescribed metronidazole tablets however did not pick them up from the pharmacy as they're $25. She asks if I can re-prescribe the medication.    Past Medical History  Diagnosis Date  . Bacterial vaginosis    History  Substance Use Topics  . Smoking status: Never Smoker   . Smokeless tobacco: Not on file  . Alcohol Use: No   ROS as above Medications: Current Facility-Administered Medications  Medication Dose Route Frequency Provider Last Rate Last Dose  . Etonogestrel IMPL 1 each  1 each Subcutaneous Once Kristen Cardinalawn M Caviness, MD       Current Outpatient Prescriptions  Medication Sig Dispense Refill  . metroNIDAZOLE (FLAGYL) 500 MG tablet Take 1 tablet (500 mg total) by mouth 2 (two) times daily.  14 tablet  0  . triamcinolone cream (KENALOG) 0.1 % Apply 1 application topically 2 (two) times daily.  30 g  0    Exam:  BP 110/57  Pulse 89  Temp(Src) 98.3 F (36.8 C) (Oral)  Resp 16  SpO2 99%  LMP 04/25/2014 Gen: Well NAD Skin: Multiple erythematous raised papules. Papules in linear groups. Occurs sporadically across her body. Nontender. Consistent with bed bug bite.  No results found for this or any previous visit (from the past 24 hour(s)). No results found.  Assessment and Plan: 23 y.o. female with bed bug bite. Plan to treat with triamcinolone cream and Gold [place] Itch and Benadryl.  We'll refill Flagyl advised patient to get the medication at [facility] where it is free.   Discussed warning signs or symptoms. Please see discharge instructions. Patient expresses  understanding.    Rodolph BongEvan S Corey, MD 05/02/14 [phone]

## 2014-05-02 NOTE — Discharge Instructions (Signed)
Thank you for coming in today. ° ° °Bedbugs °Bedbugs are tiny bugs that live in and around beds. During the day, they hide in mattresses and other places near beds. They come out at night and bite people lying in bed. They need blood to live and grow. Bedbugs can be found in beds anywhere. Usually, they are found in places where many people come and go (hotels, shelters, hospitals). It does not matter whether the place is dirty or clean. °Getting bitten by bedbugs rarely causes a medical problem. The biggest problem can be getting rid of them.  This often takes the work of a pest control expert. °CAUSES °· Less use of pesticides. Bedbugs were common before the 1950s. Then, strong pesticides such as DDT nearly wiped them out. Today, these pesticides are not used because they harm the environment and can cause health problems. °· More travel. Besides mattresses, bedbugs can also live in clothing and luggage. They can come along as people travel from place to place. Bedbugs are more common in certain parts of the world. When people travel to those areas, the bugs can come home with them. °· Presence of birds and bats. Bedbugs often infest birds and bats. If you have these animals in or near your home, bedbugs may infest your house, too. °SYMPTOMS °It does not hurt to be bitten by a bedbug. You will probably not wake up when you are bitten. Bedbugs usually bite areas of the skin that are not covered. Symptoms may show when you wake up, or they may take a day or more to show up. Symptoms may include: °· Small red bumps on the skin. These might be lined up in a row or clustered in a group. °· A darker red dot in the middle of red bumps. °· Blisters on the skin. There may be swelling and very bad itching. These may be signs of an allergic reaction. This does not happen often. °DIAGNOSIS °Bedbug bites might look and feel like other types of insect bites. The bugs do not stay on the body like ticks or lice. They bite, drop  off, and crawl away to hide. Your caregiver will probably: °· Ask about your symptoms. °· Ask about your recent activities and travel. °· Check your skin for bedbug bites. °· Ask you to check at home for signs of bedbugs. You should look for: °¨ Spots or stains on the bed or nearby. This could be from bedbugs that were crushed or from their eggs or waste. °¨ Bedbugs themselves. They are reddish-brown, oval, and flat. They do not fly. They are about the size of an apple seed. °· Places to look for bedbugs include: °¨ Beds. Check mattresses, headboards, box springs, and bed frames. °¨ On drapes and curtains near the bed. °¨ Under carpeting in the bedroom. °¨ Behind electrical outlets. °¨ Behind any wallpaper that is peeling. °¨ Inside luggage. °TREATMENT °Most bedbug bites do not need treatment. They usually go away on their own in a few days. The bites are not dangerous. However, treatment may be needed if you have scratched so much that your skin has become infected. You may also need treatment if you are allergic to bedbug bites. Treatment options include: °· A drug that stops swelling and itching (corticosteroid). Usually, a cream is rubbed on the skin. If you have a bad rash, you may be given a corticosteroid pill. °· Oral antihistamines. These are pills to help control itching. °· Antibiotic medicines. An antibiotic may be   prescribed for infected skin. °HOME CARE INSTRUCTIONS  °· Take any medicine prescribed by your caregiver for your bites. Follow the directions carefully. °· Consider wearing pajamas with long sleeves and pant legs. °· Your bedroom may need to be treated. A pest control expert should make sure the bedbugs are gone. You may need to throw away mattresses or luggage. Ask the pest control expert what you can do to keep the bedbugs from coming back. Common suggestions include: °¨ Putting a plastic cover over your mattress. °¨ Washing and drying your clothes and bedding in hot water and a hot dryer.  The temperature should be hotter than 120° F (48.9° C). Bedbugs are killed by high temperatures. °¨ Vacuuming carefully all around your bed. Vacuum in all cracks and crevices where the bugs might hide. Do this often. °¨ Carefully checking all used furniture, bedding, or clothes that you bring into your house. °¨ Eliminating bird nests and bat roosts. °· If you get bedbug bites when traveling, check all your possessions carefully before bringing them into your house. If you find any bugs on clothes or in your luggage, consider throwing those items away. °SEEK MEDICAL CARE IF: °· You have red bug bites that keep coming back. °· You have red bug bites that itch badly. °· You have bug bites that cause a skin rash. °· You have scratch marks that are red and sore. °SEEK IMMEDIATE MEDICAL CARE IF: °You have a fever. °Document Released: 12/02/2010 Document Revised: 01/22/2012 Document Reviewed: 12/02/2010 °Exit[employer]® Patient Information ©2015 [employer]. This information is not intended to replace advice given to you by your health care provider. Make sure you discuss any questions you have with your health care provider. ° °

## 2014-05-20 ENCOUNTER — Ambulatory Visit: Payer: Medicaid Other | Admitting: Family Medicine

## 2014-06-03 ENCOUNTER — Ambulatory Visit (INDEPENDENT_AMBULATORY_CARE_PROVIDER_SITE_OTHER): Payer: Medicaid Other | Admitting: Family Medicine

## 2014-06-03 DIAGNOSIS — Z3046 Encounter for surveillance of implantable subdermal contraceptive: Secondary | ICD-10-CM

## 2014-06-03 DIAGNOSIS — Z30017 Encounter for initial prescription of implantable subdermal contraceptive: Secondary | ICD-10-CM

## 2014-06-03 DIAGNOSIS — Z3009 Encounter for other general counseling and advice on contraception: Secondary | ICD-10-CM | POA: Insufficient documentation

## 2014-06-03 DIAGNOSIS — Z309 Encounter for contraceptive management, unspecified: Secondary | ICD-10-CM | POA: Insufficient documentation

## 2014-06-03 DIAGNOSIS — R112 Nausea with vomiting, unspecified: Secondary | ICD-10-CM

## 2014-06-03 LAB — POCT URINE PREGNANCY: Preg Test, Ur: NEGATIVE

## 2014-06-03 MED ORDER — ONDANSETRON 4 MG PO TBDP: 4.0000 mg | ORAL_TABLET | Freq: Three times a day (TID) | ORAL | Status: DC | PRN

## 2014-06-03 MED ORDER — ETONOGESTREL 68 MG ~~LOC~~ IMPL
68.0000 mg | DRUG_IMPLANT | Freq: Once | SUBCUTANEOUS | Status: AC
  Administered 2014-06-03: 68 mg via SUBCUTANEOUS

## 2014-06-03 NOTE — Assessment & Plan Note (Signed)
Implanon removed and Nexplanon inserted today.

## 2014-06-03 NOTE — Progress Notes (Signed)
   Subjective:    Patient ID: Alicia Dunlap, female    DOB: December 30, 1990, 23 y.o.   MRN: [id number]  HPI 23 year old female presents to the clinic today for removal of implanon and insertion of nexplanon.  1) Contraception - Her implanon was placed on 07/07/11.   - She desires continued contraception and would like removal and insertion of nexplanon today.  2) Nausea and vomiting - After patient was placed in room and consent for procedure was obtained, she looked pain and reported nausea. - Patient then had a few episodes of emesis. - She reports that she was feeling fine until this afternoon, when she developed sudden onset of symptoms.  She reports associated sweating.  No other complaints or symptoms. - No recent sick contacts, fever.  Review of Systems Per HPI    Objective:   Physical Exam General: appears pale; emesis seen on initial encounter. No acute distress. Extremties: Left upper arm - site of implanon visualized. No surrounding erythema or signs of infection.  Implanon palpable under skin.  Procedure:  Implanon Removal and Insertion of Nexplanon Patient was given informed consent for removal of her Implanon and insertion of Nexplanon. Pregnancy test was negative.  Implanon site identified. Area prepped in usual sterile fashon. 3 ml of 1% lidocaine w/ epi was used to anesthetize the area of the implanon.  A small stab incision was made right beside the implant on the distal portion. The Nexplanon rod was grasped using hemostats and removed without difficulty. There was minimal blood loss. There were no complications. Nexplanon removed from packaging, Device confirmed in needle, then inserted full length of needle and withdrawn.  Nexplanon was able to palpated in the patient's arm.  There was minimal blood loss. Patient insertion site covered with steri strips and a pressure bandage to reduce any bruising. The patient tolerated the procedure well.     Assessment & Plan:  See  Problem List

## 2014-06-03 NOTE — Assessment & Plan Note (Addendum)
Patient with nausea and vomiting prior to procedure today.  Patient requested to proceed with procedure.  Nausea/vomiting improved following administration of Zofran ODT.  Nausea recurred and patient was given IM Zofran 25 mg with improvement. Vitals signs were stable and patient was afebrile. Advised good hydration and follow up if she continues to have symptoms. Rx for Zofran ODT sent.

## 2014-06-03 NOTE — Assessment & Plan Note (Signed)
Inserted today.

## 2014-06-05 ENCOUNTER — Telehealth: Payer: Self-pay | Admitting: *Deleted

## 2014-06-05 MED ORDER — PROMETHAZINE HCL 25 MG/ML IJ SOLN
25.0000 mg | Freq: Once | INTRAMUSCULAR | Status: AC
  Administered 2014-06-05: 25 mg via INTRAMUSCULAR

## 2014-06-05 MED ORDER — ONDANSETRON HCL 4 MG PO TABS
4.0000 mg | ORAL_TABLET | Freq: Once | ORAL | Status: AC
  Administered 2014-06-03: 4 mg via ORAL

## 2014-06-05 NOTE — Addendum Note (Signed)
Addended by: Tanna SavoyPROPOSITO, Maryjean Corpening S on: 06/05/2014 09:28 AM   Modules accepted: Orders

## 2014-06-05 NOTE — Telephone Encounter (Signed)
Patient left Nxplanon patient information card,I called her to inform her that I will bemailing this to her home and to keep Card in a safe  Place.Alicia Dunlap, Alicia BouquetGiovanna Dunlap

## 2014-06-12 ENCOUNTER — Ambulatory Visit: Payer: Medicaid Other | Admitting: Family Medicine

## 2014-06-15 ENCOUNTER — Ambulatory Visit: Payer: Medicaid Other | Admitting: Family Medicine

## 2014-09-14 ENCOUNTER — Encounter (HOSPITAL_COMMUNITY): Payer: Self-pay | Admitting: Emergency Medicine

## 2014-11-27 ENCOUNTER — Ambulatory Visit: Payer: Medicaid Other | Admitting: Family Medicine

## 2014-12-14 ENCOUNTER — Ambulatory Visit: Payer: Medicaid Other | Admitting: Family Medicine

## 2015-02-12 ENCOUNTER — Emergency Department (INDEPENDENT_AMBULATORY_CARE_PROVIDER_SITE_OTHER)
Admission: EM | Admit: 2015-02-12 | Discharge: 2015-02-12 | Disposition: A | Discharge status: Home / Self Care | Payer: Self-pay | Source: Home / Self Care | Attending: Family Medicine | Admitting: Family Medicine

## 2015-02-12 ENCOUNTER — Encounter (HOSPITAL_COMMUNITY): Payer: Self-pay | Admitting: Emergency Medicine

## 2015-02-12 DIAGNOSIS — J029 Acute pharyngitis, unspecified: Secondary | ICD-10-CM

## 2015-02-12 DIAGNOSIS — J40 Bronchitis, not specified as acute or chronic: Secondary | ICD-10-CM

## 2015-02-12 LAB — POCT RAPID STREP A: STREPTOCOCCUS, GROUP A SCREEN (DIRECT): NEGATIVE

## 2015-02-12 MED ORDER — IPRATROPIUM-ALBUTEROL 0.5-2.5 (3) MG/3ML IN SOLN
3.0000 mL | Freq: Once | RESPIRATORY_TRACT | Status: AC
  Administered 2015-02-12: 3 mL via RESPIRATORY_TRACT

## 2015-02-12 MED ORDER — IPRATROPIUM-ALBUTEROL 0.5-2.5 (3) MG/3ML IN SOLN
RESPIRATORY_TRACT | Status: AC
  Filled 2015-02-12: qty 3

## 2015-02-12 MED ORDER — PREDNISONE 10 MG PO TABS: 30.0000 mg | ORAL_TABLET | Freq: Every day | ORAL | Status: DC

## 2015-02-12 MED ORDER — CLINDAMYCIN HCL 300 MG PO CAPS: 300.0000 mg | ORAL_CAPSULE | Freq: Three times a day (TID) | ORAL | Status: DC

## 2015-02-12 NOTE — ED Notes (Signed)
C/o cold sx onset 2 days Sx include: ST, night sweats, SOB, BA, chills Denies fevers Taking Nyquil w/no relief Alert, no signs of acute distress.

## 2015-02-12 NOTE — ED Provider Notes (Signed)
Alicia Dunlap is a 24 y.o. female who presents to Urgent Care today for sore throat headache body aches wheezing shortness of breath. Patient also noted some sneezing and itchy watery eyes. No chest pains or palpitations. Patient denies any significant fever. She's tried some NyQuil which helps some.   Past Medical History  Diagnosis Date  . Bacterial vaginosis    History reviewed. No pertinent past surgical history. History  Substance Use Topics  . Smoking status: Never Smoker   . Smokeless tobacco: Not on file  . Alcohol Use: No   ROS as above Medications: Current Facility-Administered Medications  Medication Dose Route Frequency Provider Last Rate Last Dose  . Etonogestrel IMPL 1 each  1 each Subcutaneous Once Kristen Cardinalawn M Caviness, MD       Current Outpatient Prescriptions  Medication Sig Dispense Refill  . clindamycin (CLEOCIN) 300 MG capsule Take 1 capsule (300 mg total) by mouth 3 (three) times daily. 30 capsule 0  . metroNIDAZOLE (FLAGYL) 500 MG tablet Take 1 tablet (500 mg total) by mouth 2 (two) times daily. 14 tablet 0  . ondansetron (ZOFRAN ODT) 4 MG disintegrating tablet Take 1 tablet (4 mg total) by mouth every 8 (eight) hours as needed for nausea or vomiting. 20 tablet 0  . predniSONE (DELTASONE) 10 MG tablet Take 3 tablets (30 mg total) by mouth daily. 15 tablet 0  . triamcinolone cream (KENALOG) 0.1 % Apply 1 application topically 2 (two) times daily. 30 g 0   Allergies  Allergen Reactions  . Amoxicillin Rash     Exam:  BP 118/82 mmHg  Pulse 109  Temp(Src) 98.8 F (37.1 C) (Oral)  Resp 16  SpO2 97%  LMP 02/05/2015 Gen: Well NAD HEENT: EOMI,  MMM . Pharynx is erythematous with exudates bilaterally. Cervical lymphadenopathy is present bilaterally. Normal tympanic membranes. Clear nasal discharge present. Normal appearing eyes. Lungs: Normal work of breathing. CTABL Heart: RRR no MRG Abd: NABS, Soft. Nondistended, Nontender Exts: Brisk capillary refill, warm  and well perfused.   Patient was given a 2.5/0.5 mg DuoNeb nebulizer treatment, and felt better  Results for orders placed or performed during the hospital encounter of 02/12/15 (from the past 24 hour(s))  POCT rapid strep [facility])     Status: None   Collection Time: 02/12/15  6:25 PM  Result Value Ref Range   Streptococcus, Group A Screen (Direct) NEGATIVE NEGATIVE   No results found.  Assessment and Plan: 24 y.o. female with bronchitis. Treat with prednisone. Patient also appears to have tonsillitis. Throat culture pending. Treat with clindamycin. Watchful waiting. Follow-up with PCP.  Discussed warning signs or symptoms. Please see discharge instructions. Patient expresses understanding.     Rodolph BongEvan S Corey, MD 02/12/15 [phone]

## 2015-02-12 NOTE — Discharge Instructions (Signed)
Thank you for coming in today. °Call or go to the emergency room if you get worse, have trouble breathing, have chest pains, or palpitations.  ° °Acute Bronchitis °Bronchitis is inflammation of the airways that extend from the windpipe into the lungs (bronchi). The inflammation often causes mucus to develop. This leads to a cough, which is the most common symptom of bronchitis.  °In acute bronchitis, the condition usually develops suddenly and goes away over time, usually in a couple weeks. Smoking, allergies, and asthma can make bronchitis worse. Repeated episodes of bronchitis may cause further lung problems.  °CAUSES °Acute bronchitis is most often caused by the same virus that causes a cold. The virus can spread from person to person (contagious) through coughing, sneezing, and touching contaminated objects. °SIGNS AND SYMPTOMS  °· Cough.   °· Fever.   °· Coughing up mucus.   °· Body aches.   °· Chest congestion.   °· Chills.   °· Shortness of breath.   °· Sore throat.   °DIAGNOSIS  °Acute bronchitis is usually diagnosed through a physical exam. Your health care provider will also ask you questions about your medical history. Tests, such as chest X-rays, are sometimes done to rule out other conditions.  °TREATMENT  °Acute bronchitis usually goes away in a couple weeks. Oftentimes, no medical treatment is necessary. Medicines are sometimes given for relief of fever or cough. Antibiotic medicines are usually not needed but may be prescribed in certain situations. In some cases, an inhaler may be recommended to help reduce shortness of breath and control the cough. A cool mist vaporizer may also be used to help thin bronchial secretions and make it easier to clear the chest.  °HOME CARE INSTRUCTIONS °· Get plenty of rest.   °· Drink enough fluids to keep your urine clear or pale yellow (unless you have a medical condition that requires fluid restriction). Increasing fluids may help thin your respiratory secretions  (sputum) and reduce chest congestion, and it will prevent dehydration.   °· Take medicines only as directed by your health care provider. °· If you were prescribed an antibiotic medicine, finish it all even if you start to feel better. °· Avoid smoking and secondhand smoke. Exposure to cigarette smoke or irritating chemicals will make bronchitis worse. If you are a smoker, consider using nicotine gum or skin patches to help control withdrawal symptoms. Quitting smoking will help your lungs heal faster.   °· Reduce the chances of another bout of acute bronchitis by washing your hands frequently, avoiding people with cold symptoms, and trying not to touch your hands to your mouth, nose, or eyes.   °· Keep all follow-up visits as directed by your health care provider.   °SEEK MEDICAL CARE IF: °Your symptoms do not improve after 1 week of treatment.  °SEEK IMMEDIATE MEDICAL CARE IF: °· You develop an increased fever or chills.   °· You have chest pain.   °· You have severe shortness of breath. °· You have bloody sputum.   °· You develop dehydration. °· You faint or repeatedly feel like you are going to pass out. °· You develop repeated vomiting. °· You develop a severe headache. °MAKE SURE YOU:  °· Understand these instructions. °· Will watch your condition. °· Will get help right away if you are not doing well or get worse. °Document Released: 12/07/2004 Document Revised: 03/16/2014 Document Reviewed: 04/22/2013 °ExitCare® Patient Information ©2015 [employer]. This information is not intended to replace advice given to you by your health care provider. Make sure you discuss any questions you have with your   health care provider.   Pharyngitis Pharyngitis is redness, pain, and swelling (inflammation) of your pharynx.  CAUSES  Pharyngitis is usually caused by infection. Most of the time, these infections are from viruses (viral) and are part of a cold. However, sometimes pharyngitis is caused by bacteria  (bacterial). Pharyngitis can also be caused by allergies. Viral pharyngitis may be spread from person to person by coughing, sneezing, and personal items or utensils (cups, forks, spoons, toothbrushes). Bacterial pharyngitis may be spread from person to person by more intimate contact, such as kissing.  SIGNS AND SYMPTOMS  Symptoms of pharyngitis include:   Sore throat.   Tiredness (fatigue).   Low-grade fever.   Headache.  Joint pain and muscle aches.  Skin rashes.  Swollen lymph nodes.  Plaque-like film on throat or tonsils (often seen with bacterial pharyngitis). DIAGNOSIS  Your health care provider will ask you questions about your illness and your symptoms. Your medical history, along with a physical exam, is often all that is needed to diagnose pharyngitis. Sometimes, a rapid strep test is done. Other lab tests may also be done, depending on the suspected cause.  TREATMENT  Viral pharyngitis will usually get better in 3-4 days without the use of medicine. Bacterial pharyngitis is treated with medicines that kill germs (antibiotics).  HOME CARE INSTRUCTIONS   Drink enough water and fluids to keep your urine clear or pale yellow.   Only take over-the-counter or prescription medicines as directed by your health care provider:   If you are prescribed antibiotics, make sure you finish them even if you start to feel better.   Do not take aspirin.   Get lots of rest.   Gargle with 8 oz of salt water ( tsp of salt per 1 qt of water) as often as every 1-2 hours to soothe your throat.   Throat lozenges (if you are not at risk for choking) or sprays may be used to soothe your throat. SEEK MEDICAL CARE IF:   You have large, tender lumps in your neck.  You have a rash.  You cough up green, yellow-brown, or bloody spit. SEEK IMMEDIATE MEDICAL CARE IF:   Your neck becomes stiff.  You drool or are unable to swallow liquids.  You vomit or are unable to keep  medicines or liquids down.  You have severe pain that does not go away with the use of recommended medicines.  You have trouble breathing (not caused by a stuffy nose). MAKE SURE YOU:   Understand these instructions.  Will watch your condition.  Will get help right away if you are not doing well or get worse. Document Released: 10/30/2005 Document Revised: 08/20/2013 Document Reviewed: 07/07/2013 [facility] Patient Information 2015 [employer], [place]. This information is not intended to replace advice given to you by your health care provider. Make sure you discuss any questions you have with your health care provider.

## 2015-02-15 LAB — CULTURE, GROUP A STREP

## 2015-02-17 ENCOUNTER — Telehealth (HOSPITAL_COMMUNITY): Payer: Self-pay | Admitting: *Deleted

## 2015-02-17 NOTE — ED Notes (Signed)
Throat culture: Strep beta hemolytic not group A.  I called pt. Pt. verified x 2 and given result.  Pt. told she was adequately treated with Cleocin and to finish all of the medication. If not better to get rechecked.  If anyone else exposed gets the same symptoms, should get checked for strep. Vassie MoselleYork, Alicia Dunlap 02/17/2015

## 2015-03-08 ENCOUNTER — Encounter: Payer: Medicaid Other | Admitting: Family Medicine

## 2015-04-09 ENCOUNTER — Ambulatory Visit: Payer: Medicaid Other | Admitting: Family Medicine

## 2015-07-16 ENCOUNTER — Emergency Department (HOSPITAL_COMMUNITY)
Admission: EM | Admit: 2015-07-16 | Discharge: 2015-07-16 | Disposition: A | Discharge status: Home / Self Care | Payer: No Typology Code available for payment source | Attending: Emergency Medicine | Admitting: Emergency Medicine

## 2015-07-16 ENCOUNTER — Encounter (HOSPITAL_COMMUNITY): Payer: Self-pay | Admitting: Emergency Medicine

## 2015-07-16 DIAGNOSIS — Y998 Other external cause status: Secondary | ICD-10-CM | POA: Diagnosis not present

## 2015-07-16 DIAGNOSIS — Z7952 Long term (current) use of systemic steroids: Secondary | ICD-10-CM | POA: Diagnosis not present

## 2015-07-16 DIAGNOSIS — Z880 Allergy status to penicillin: Secondary | ICD-10-CM | POA: Insufficient documentation

## 2015-07-16 DIAGNOSIS — Y9389 Activity, other specified: Secondary | ICD-10-CM | POA: Diagnosis not present

## 2015-07-16 DIAGNOSIS — Z8742 Personal history of other diseases of the female genital tract: Secondary | ICD-10-CM | POA: Insufficient documentation

## 2015-07-16 DIAGNOSIS — Z792 Long term (current) use of antibiotics: Secondary | ICD-10-CM | POA: Diagnosis not present

## 2015-07-16 DIAGNOSIS — Y92410 Unspecified street and highway as the place of occurrence of the external cause: Secondary | ICD-10-CM | POA: Insufficient documentation

## 2015-07-16 DIAGNOSIS — S50811A Abrasion of right forearm, initial encounter: Secondary | ICD-10-CM | POA: Diagnosis present

## 2015-07-16 DIAGNOSIS — T148XXA Other injury of unspecified body region, initial encounter: Secondary | ICD-10-CM

## 2015-07-16 MED ORDER — ACETAMINOPHEN 500 MG PO TABS
1000.0000 mg | ORAL_TABLET | Freq: Once | ORAL | Status: AC
  Administered 2015-07-16: 1000 mg via ORAL
  Filled 2015-07-16 (×2): qty 2

## 2015-07-16 MED ORDER — BACITRACIN ZINC 500 UNIT/GM EX OINT
TOPICAL_OINTMENT | Freq: Two times a day (BID) | CUTANEOUS | Status: DC
  Administered 2015-07-16: 1 via TOPICAL
  Filled 2015-07-16: qty 0.9
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
## 2015-07-16 NOTE — ED Notes (Signed)
Patient tolerated wound care without incident. Clean right arm wound with sterile water and applied bacitracin 4x4 and 2x2 gauze.

## 2015-07-16 NOTE — Discharge Instructions (Signed)
Keep clean, watch for signs of infection. Tylenol and motrin for pain.  If you were given medicines take as directed.  If you are on coumadin or contraceptives realize their levels and effectiveness is altered by many different medicines.  If you have any reaction (rash, tongues swelling, other) to the medicines stop taking and see a physician.    If your blood pressure was elevated in the ER make sure you follow up for management with a primary doctor or return for chest pain, shortness of breath or stroke symptoms.  Please follow up as directed and return to the ER or see a physician for new or worsening symptoms.  Thank you. There were no vitals filed for this visit.

## 2015-08-19 ENCOUNTER — Emergency Department (HOSPITAL_COMMUNITY)
Admission: EM | Admit: 2015-08-19 | Discharge: 2015-08-19 | Disposition: A | Discharge status: Home / Self Care | Payer: Medicaid Other | Attending: Emergency Medicine | Admitting: Emergency Medicine

## 2015-08-19 ENCOUNTER — Encounter (HOSPITAL_COMMUNITY): Payer: Self-pay | Admitting: Emergency Medicine

## 2015-08-19 DIAGNOSIS — B9689 Other specified bacterial agents as the cause of diseases classified elsewhere: Secondary | ICD-10-CM

## 2015-08-19 DIAGNOSIS — Z880 Allergy status to penicillin: Secondary | ICD-10-CM | POA: Insufficient documentation

## 2015-08-19 DIAGNOSIS — N760 Acute vaginitis: Secondary | ICD-10-CM | POA: Insufficient documentation

## 2015-08-19 DIAGNOSIS — Z3202 Encounter for pregnancy test, result negative: Secondary | ICD-10-CM | POA: Insufficient documentation

## 2015-08-19 LAB — URINALYSIS, ROUTINE W REFLEX MICROSCOPIC
Bilirubin Urine: NEGATIVE
Glucose, UA: NEGATIVE mg/dL
Ketones, ur: NEGATIVE mg/dL
LEUKOCYTES UA: NEGATIVE
Nitrite: NEGATIVE
Protein, ur: NEGATIVE mg/dL
Specific Gravity, Urine: 1.021 (ref 1.005–1.030)
Urobilinogen, UA: 1 mg/dL (ref 0.0–1.0)
pH: 5.5 (ref 5.0–8.0)

## 2015-08-19 LAB — URINE MICROSCOPIC-ADD ON

## 2015-08-19 LAB — WET PREP, GENITAL
TRICH WET PREP: NONE SEEN
YEAST WET PREP: NONE SEEN

## 2015-08-19 LAB — POC URINE PREG, ED: Preg Test, Ur: NEGATIVE

## 2015-08-19 MED ORDER — METRONIDAZOLE 500 MG PO TABS: 500.0000 mg | ORAL_TABLET | Freq: Two times a day (BID) | ORAL | Status: DC

## 2015-08-19 NOTE — Discharge Instructions (Signed)

## 2015-08-19 NOTE — ED Provider Notes (Signed)
CSN: [id number]     Arrival date & time 08/19/15  1619 History   First MD Initiated Contact with Patient 08/19/15 1753     Chief Complaint  Patient presents with  . Dysuria  . Vaginal Discharge  . Possible Pregnancy      HPI Patient presents to the emergency department complaining of vaginal discharge and vaginal odor.  She also is concerned that she could be pregnant.  She had some vaginal spotting.  She denies abdominal pain.  Recent nonproductive intercourse.  No other complaints.  No fevers or chills.  No back pain.  No upper abdominal pain.   Past Medical History  Diagnosis Date  . Bacterial vaginosis    No past surgical history on file. No family history on file. Social History  Substance Use Topics  . Smoking status: Never Smoker   . Smokeless tobacco: None  . Alcohol Use: No   OB History    Gravida Para Term Preterm AB TAB SAB Ectopic Multiple Living   Review of Systems  All other systems reviewed and are negative.     Allergies  Amoxicillin  Home Medications   Prior to Admission medications   Medication Sig Start Date End Date Taking? Authorizing Provider  etonogestrel (IMPLANON) 68 MG IMPL implant 1 each by Subdermal route once.   Yes Historical Provider, MD  metroNIDAZOLE (FLAGYL) 500 MG tablet Take 1 tablet (500 mg total) by mouth 2 (two) times daily. 08/19/15   Azalia Bilis, MD   BP 116/69 mmHg  Pulse 92  Temp(Src) 98.1 F (36.7 C) (Oral)  Resp 18  SpO2 100%  LMP  (LMP Unknown) Physical Exam  Constitutional: She is oriented to person, place, and time. She appears well-developed and well-nourished. No distress.  HENT:  Head: Normocephalic and atraumatic.  Eyes: EOM are normal.  Neck: Normal range of motion.  Cardiovascular: Normal rate, regular rhythm and normal heart sounds.   Pulmonary/Chest: Effort normal and breath sounds normal.  Abdominal: Soft. She exhibits no distension. There is no tenderness.  Genitourinary:   Normal external genitalia.  No cervical motion tenderness.  No adnexal masses or fullness.  Scant vaginal discharge with some odor.  Chaperone present  Musculoskeletal: Normal range of motion.  Neurological: She is alert and oriented to person, place, and time.  Skin: Skin is warm and dry.  Psychiatric: She has a normal mood and affect. Judgment normal.  Nursing note and vitals reviewed.   ED Course  Procedures (including critical care time) Labs Review Labs Reviewed  WET PREP, GENITAL - Abnormal; Notable for the following:    Clue Cells Wet Prep HPF POC MODERATE (*)    WBC, Wet Prep HPF POC FEW (*)    All other components within normal limits  URINALYSIS, ROUTINE W REFLEX MICROSCOPIC (NOT AT [facility]) - Abnormal; Notable for the following:    Hgb urine dipstick SMALL (*)    All other components within normal limits  URINE MICROSCOPIC-ADD ON - Abnormal; Notable for the following:    Squamous Epithelial / LPF FEW (*)    All other components within normal limits  RPR  HIV ANTIBODY (ROUTINE TESTING)  POC URINE PREG, ED  GC/CHLAMYDIA PROBE AMP () NOT AT [facility]    Imaging Review No results found. I have personally reviewed and evaluated these images and lab results as part of my medical decision-making.   EKG Interpretation None  MDM   Final diagnoses:  Bacterial vaginosis    Patient will be treated for bacterial overgrowth.  Gonorrhea Chlamydia cultures pending.  HIV pending.  RPR pending.    Azalia Bilis, MD 08/19/15 2022

## 2015-08-19 NOTE — ED Notes (Signed)
Pt states that she has been having dysuria, white, thick, malodorous vaginal discharge, and thinks she may be pregnant.  Has not taken a pregnancy test at home.  Has implanon but was spotting 3 days ago.

## 2015-08-20 LAB — HIV ANTIBODY (ROUTINE TESTING W REFLEX): HIV Screen 4th Generation wRfx: NONREACTIVE

## 2015-08-20 LAB — RPR: RPR: NONREACTIVE

## 2015-08-20 LAB — GC/CHLAMYDIA PROBE AMP (~~LOC~~) NOT AT ARMC
CHLAMYDIA, DNA PROBE: NEGATIVE
Neisseria Gonorrhea: NEGATIVE

## 2015-08-22 ENCOUNTER — Encounter (HOSPITAL_COMMUNITY): Payer: Self-pay | Admitting: Emergency Medicine

## 2015-08-22 ENCOUNTER — Emergency Department (HOSPITAL_COMMUNITY)
Admission: EM | Admit: 2015-08-22 | Discharge: 2015-08-22 | Disposition: A | Discharge status: Home / Self Care | Payer: Medicaid Other | Attending: Emergency Medicine | Admitting: Emergency Medicine

## 2015-08-22 DIAGNOSIS — Z880 Allergy status to penicillin: Secondary | ICD-10-CM | POA: Insufficient documentation

## 2015-08-22 DIAGNOSIS — Z3202 Encounter for pregnancy test, result negative: Secondary | ICD-10-CM | POA: Insufficient documentation

## 2015-08-22 DIAGNOSIS — Z8742 Personal history of other diseases of the female genital tract: Secondary | ICD-10-CM | POA: Insufficient documentation

## 2015-08-22 DIAGNOSIS — R197 Diarrhea, unspecified: Secondary | ICD-10-CM | POA: Insufficient documentation

## 2015-08-22 DIAGNOSIS — J039 Acute tonsillitis, unspecified: Secondary | ICD-10-CM | POA: Insufficient documentation

## 2015-08-22 LAB — CBC
HEMATOCRIT: 36.4 % (ref 36.0–46.0)
HEMOGLOBIN: 12.3 g/dL (ref 12.0–15.0)
MCH: 27.8 pg (ref 26.0–34.0)
MCHC: 33.8 g/dL (ref 30.0–36.0)
MCV: 82.4 fL (ref 78.0–100.0)
Platelets: 284 10*3/uL (ref 150–400)
RBC: 4.42 MIL/uL (ref 3.87–5.11)
RDW: 12.4 % (ref 11.5–15.5)
WBC: 15.2 10*3/uL — ABNORMAL HIGH (ref 4.0–10.5)

## 2015-08-22 LAB — COMPREHENSIVE METABOLIC PANEL
ALBUMIN: 4.1 g/dL (ref 3.5–5.0)
ALT: 12 U/L — ABNORMAL LOW (ref 14–54)
ANION GAP: 9 (ref 5–15)
AST: 22 U/L (ref 15–41)
Alkaline Phosphatase: 58 U/L (ref 38–126)
BUN: 7 mg/dL (ref 6–20)
CALCIUM: 9 mg/dL (ref 8.9–10.3)
CHLORIDE: 104 mmol/L (ref 101–111)
CO2: 23 mmol/L (ref 22–32)
Creatinine, Ser: 0.79 mg/dL (ref 0.44–1.00)
GFR calc Af Amer: >60 mL/min (ref >60)
GFR calc non Af Amer: >60 mL/min (ref >60)
GLUCOSE: 148 mg/dL — AB (ref 65–99)
POTASSIUM: 3.8 mmol/L (ref 3.5–5.1)
SODIUM: 136 mmol/L (ref 135–145)
Total Bilirubin: 0.6 mg/dL (ref 0.3–1.2)
Total Protein: 7.4 g/dL (ref 6.5–8.1)

## 2015-08-22 LAB — URINALYSIS, ROUTINE W REFLEX MICROSCOPIC
Bilirubin Urine: NEGATIVE
GLUCOSE, UA: NEGATIVE mg/dL
Ketones, ur: NEGATIVE mg/dL
LEUKOCYTES UA: NEGATIVE
Nitrite: NEGATIVE
PROTEIN: NEGATIVE mg/dL
SPECIFIC GRAVITY, URINE: 1.006 (ref 1.005–1.030)
Urobilinogen, UA: 1 mg/dL (ref 0.0–1.0)
pH: 5.5 (ref 5.0–8.0)

## 2015-08-22 LAB — URINE MICROSCOPIC-ADD ON

## 2015-08-22 LAB — I-STAT BETA HCG BLOOD, ED (MC, WL, AP ONLY): I-stat hCG, quantitative: <5 m[IU]/mL (ref <5)

## 2015-08-22 MED ORDER — AZITHROMYCIN 250 MG PO TABS
500.0000 mg | ORAL_TABLET | Freq: Once | ORAL | Status: AC
  Administered 2015-08-22: 500 mg via ORAL
  Filled 2015-08-22: qty 2

## 2015-08-22 MED ORDER — IBUPROFEN 200 MG PO TABS
400.0000 mg | ORAL_TABLET | Freq: Once | ORAL | Status: AC
  Administered 2015-08-22: 400 mg via ORAL
  Filled 2015-08-22: qty 2

## 2015-08-22 MED ORDER — AZITHROMYCIN 250 MG PO TABS: ORAL_TABLET | ORAL | Status: DC

## 2015-08-22 NOTE — ED Provider Notes (Signed)
CSN: [id number]     Arrival date & time 08/22/15  1342 History   First MD Initiated Contact with Patient 08/22/15 1352     Chief Complaint  Patient presents with  . Diarrhea  . Generalized Body Aches  . URI  . Sore Throat     (Consider location/radiation/quality/duration/timing/severity/associated sxs/prior Treatment) HPI   Alicia Dunlap is a 24 y.o. female who presents for evaluation of multiple symptoms including feeling hot, rhinorrhea, cough and sore throat. She was treated for nonspecific vaginitis after an emergency department visit 08/19/2015. She has abdominal pain and a single episode of diarrhea at this time. She was prescribed Flagyl and the recent visit, but has not started taking yet. No known sick contacts. She has mild sneezing and coughing. There are no other known modifying factors.   Past Medical History  Diagnosis Date  . Bacterial vaginosis    History reviewed. No pertinent past surgical history. History reviewed. No pertinent family history. Social History  Substance Use Topics  . Smoking status: Never Smoker   . Smokeless tobacco: None  . Alcohol Use: No   OB History    Gravida Para Term Preterm AB TAB SAB Ectopic Multiple Living   Review of Systems  All other systems reviewed and are negative.     Allergies  Amoxicillin  Home Medications   Prior to Admission medications   Medication Sig Start Date End Date Taking? Authorizing Provider  Aspirin-Salicylamide-Caffeine (BC HEADACHE POWDER PO) Take 1 packet by mouth as needed (for pain).   Yes Historical Provider, MD  azithromycin (ZITHROMAX) 250 MG tablet 1 daily, until gone, starting 09/23/2015 08/22/15   Mancel Bale, MD  etonogestrel (IMPLANON) 68 MG IMPL implant 1 each by Subdermal route once.    Historical Provider, MD  metroNIDAZOLE (FLAGYL) 500 MG tablet Take 1 tablet (500 mg total) by mouth 2 (two) times daily. Patient not taking: Reported on 08/22/2015 08/19/15   Azalia Bilis, MD   BP 128/78 mmHg  Pulse 100  Temp(Src) 99.4 F (37.4 C) (Oral)  Resp 18  SpO2 100%  LMP  (LMP Unknown) Physical Exam  Constitutional: She is oriented to person, place, and time. She appears well-developed and well-nourished.  HENT:  Head: Normocephalic and atraumatic.  Right Ear: External ear normal.  Left Ear: External ear normal.  Bilateral tonsillar hypertrophy, right greater than left, with exudate. No trismus, and no peritonsillar swelling.  Eyes: Conjunctivae and EOM are normal. Pupils are equal, round, and reactive to light.  Neck: Normal range of motion and phonation normal. Neck supple.  Cardiovascular: Normal rate, regular rhythm and normal heart sounds.   Pulmonary/Chest: Effort normal and breath sounds normal. She exhibits no bony tenderness.  Abdominal: Soft. She exhibits no distension. There is no tenderness. There is no rebound.  Musculoskeletal: Normal range of motion.  Neurological: She is alert and oriented to person, place, and time. No cranial nerve deficit or sensory deficit. She exhibits normal muscle tone. Coordination normal.  Skin: Skin is warm, dry and intact.  Psychiatric: She has a normal mood and affect. Her behavior is normal. Judgment and thought content normal.  Nursing note and vitals reviewed.   ED Course  Procedures (including critical care time)  Medications  ibuprofen (ADVIL,MOTRIN) tablet 400 mg (not administered)  azithromycin (ZITHROMAX) tablet 500 mg (500 mg Oral Given 08/22/15 1523)    Patient Vitals for the past 24 hrs:  BP Temp Temp src Pulse Resp SpO2  08/22/15 1525 128/78 mmHg 99.4 F (37.4 C) Oral 100 18 100 %  08/22/15 1346 128/72 mmHg 99.2 F (37.3 C) Oral 114 20 97 %   Findings discussed with patient, all questions answered.   Labs Review Labs Reviewed  COMPREHENSIVE METABOLIC PANEL - Abnormal; Notable for the following:    Glucose, Bld 148 (*)    ALT 12 (*)    All other components within normal limits   CBC - Abnormal; Notable for the following:    WBC 15.2 (*)    All other components within normal limits  URINALYSIS, ROUTINE W REFLEX MICROSCOPIC (NOT AT [facility]) - Abnormal; Notable for the following:    Hgb urine dipstick TRACE (*)    All other components within normal limits  URINE MICROSCOPIC-ADD ON  I-STAT BETA HCG BLOOD, ED ([facility], [facility], [facility] ONLY)    Imaging Review No results found. I have personally reviewed and evaluated these images and lab results as part of my medical decision-making.   EKG Interpretation None      MDM   Final diagnoses:  Tonsillitis    Evaluation consistent with uncomplicated tonsillitis. Doubt peritonsillar abscess, sepsis, or metabolic instability.  Nursing Notes Reviewed/ Care Coordinated Applicable Imaging Reviewed Interpretation of Laboratory Data incorporated into ED treatment  The patient appears reasonably screened and/or stabilized for discharge and I doubt any other medical condition or other [facility] requiring further screening, evaluation, or treatment in the ED at this time prior to discharge.  Plan: Home Medications- Zithromax; Home Treatments- rest; return here if the recommended treatment, does not improve the symptoms; Recommended follow up- PCP prn   Mancel Bale, MD 08/22/15 1537

## 2015-08-22 NOTE — ED Notes (Addendum)
Pt complaining of hot flashes, runny nose, dry cough, and sore throat that started this morning. States she also was having abdominal cramping and diarrhea, so she took a BC powder and some anti-diarrheal medication around 10:00

## 2015-08-22 NOTE — Discharge Instructions (Signed)

## 2015-09-08 ENCOUNTER — Encounter (HOSPITAL_COMMUNITY): Payer: Self-pay | Admitting: *Deleted

## 2015-09-08 ENCOUNTER — Emergency Department (HOSPITAL_COMMUNITY)
Admission: EM | Admit: 2015-09-08 | Discharge: 2015-09-08 | Disposition: A | Discharge status: Home / Self Care | Payer: Medicaid Other | Attending: Emergency Medicine | Admitting: Emergency Medicine

## 2015-09-08 DIAGNOSIS — Z8619 Personal history of other infectious and parasitic diseases: Secondary | ICD-10-CM | POA: Insufficient documentation

## 2015-09-08 DIAGNOSIS — Z793 Long term (current) use of hormonal contraceptives: Secondary | ICD-10-CM | POA: Insufficient documentation

## 2015-09-08 DIAGNOSIS — M791 Myalgia: Secondary | ICD-10-CM | POA: Insufficient documentation

## 2015-09-08 DIAGNOSIS — J40 Bronchitis, not specified as acute or chronic: Secondary | ICD-10-CM | POA: Insufficient documentation

## 2015-09-08 DIAGNOSIS — Z8742 Personal history of other diseases of the female genital tract: Secondary | ICD-10-CM | POA: Insufficient documentation

## 2015-09-08 DIAGNOSIS — Z880 Allergy status to penicillin: Secondary | ICD-10-CM | POA: Insufficient documentation

## 2015-09-08 MED ORDER — IPRATROPIUM-ALBUTEROL 0.5-2.5 (3) MG/3ML IN SOLN
3.0000 mL | Freq: Once | RESPIRATORY_TRACT | Status: AC
  Administered 2015-09-08: 3 mL via RESPIRATORY_TRACT
  Filled 2015-09-08: qty 3

## 2015-09-08 MED ORDER — ALBUTEROL SULFATE HFA 108 (90 BASE) MCG/ACT IN AERS
2.0000 | INHALATION_SPRAY | RESPIRATORY_TRACT | Status: DC | PRN
  Administered 2015-09-08: 2 via RESPIRATORY_TRACT
  Filled 2015-09-08: qty 6.7

## 2015-09-08 MED ORDER — PREDNISONE 10 MG PO TABS: 20.0000 mg | ORAL_TABLET | Freq: Two times a day (BID) | ORAL | Status: DC

## 2015-09-08 MED ORDER — PREDNISONE 20 MG PO TABS
40.0000 mg | ORAL_TABLET | Freq: Once | ORAL | Status: AC
  Administered 2015-09-08: 40 mg via ORAL
  Filled 2015-09-08: qty 2

## 2015-09-08 MED ORDER — BENZONATATE 200 MG PO CAPS: 200.0000 mg | ORAL_CAPSULE | Freq: Three times a day (TID) | ORAL | Status: DC | PRN

## 2015-09-08 NOTE — ED Provider Notes (Signed)
CSN: [id number]     Arrival date & time 09/08/15  1130 History  By signing my name below, I, Alicia Dunlap, attest that this documentation has been prepared under the direction and in the presence of Kerrie Buffalo, NP.  Electronically Signed: [name], ED Scribe. 09/08/2015. 1:27 PM.   Chief Complaint  Patient presents with  . Cough  . Nasal Congestion  . Wheezing    The history is provided by the patient. No language interpreter was used.    HPI Comments: Alicia Dunlap is a 24 y.o. female who presents to the Emergency Department complaining of intermittent, moderate, productive cough with yellow sputum that started 2 days ago. She states chest congestion, generalized body aches, rhinorrhea and wheezing that started 2 days ago as associated symptoms. Pt tried Nyquil with no relief. Her daughter has similar symptoms and was treated in the [facility] ED for viral bronchitis and given an albuterol inhaler. Pt denies a history of asthma. She also denies sinus pressure, nausea, vomiting and fever.   Past Medical History  Diagnosis Date  . Bacterial vaginosis    History reviewed. No pertinent past surgical history. No family history on file. Social History  Substance Use Topics  . Smoking status: Never Smoker   . Smokeless tobacco: None  . Alcohol Use: No   OB History    Gravida Para Term Preterm AB TAB SAB Ectopic Multiple Living   Review of Systems  Constitutional: Negative for fever.  HENT: Positive for congestion and rhinorrhea. Negative for sinus pressure.   Respiratory: Positive for cough and wheezing.   Gastrointestinal: Negative for nausea and vomiting.  Musculoskeletal: Positive for myalgias.  All other systems reviewed and are negative.     Allergies  Amoxicillin  Home Medications   Prior to Admission medications   Medication Sig Start Date End Date Taking? Authorizing Provider  Aspirin-Salicylamide-Caffeine (BC HEADACHE POWDER PO) Take 1  packet by mouth as needed (for pain).    Historical Provider, MD  benzonatate (TESSALON) 200 MG capsule Take 1 capsule (200 mg total) by mouth 3 (three) times daily as needed for cough. 09/08/15   Hope Orlene Och, NP  etonogestrel (IMPLANON) 68 MG IMPL implant 1 each by Subdermal route once.    Historical Provider, MD  predniSONE (DELTASONE) 10 MG tablet Take 2 tablets (20 mg total) by mouth 2 (two) times daily with a meal. 09/08/15   Hope M Neese, NP   BP 130/76 mmHg  Pulse 82  Temp(Src) 98.1 F (36.7 C)  Resp 17  SpO2 99%  LMP  (LMP Unknown) Physical Exam  Constitutional: She is oriented to person, place, and time. She appears well-developed and well-nourished. No distress.  HENT:  Head: Normocephalic and atraumatic.  Right Ear: Tympanic membrane normal.  Left Ear: Tympanic membrane normal.  Mouth/Throat: Uvula is midline. No posterior oropharyngeal edema or posterior oropharyngeal erythema.  Eyes: Conjunctivae and EOM are normal. Pupils are equal, round, and reactive to light. No scleral icterus.  Neck: Normal range of motion. Neck supple. No tracheal deviation present.  Cardiovascular: Normal rate and regular rhythm.   Pulmonary/Chest: Effort normal. No respiratory distress. She has wheezes.  Inspiratory wheezes bilaterally  Abdominal: Soft. There is no tenderness.  Musculoskeletal: Normal range of motion.  Lymphadenopathy:    She has no cervical adenopathy.  Neurological: She is alert and oriented to person, place, and time.  Skin: Skin is warm  and dry.  Psychiatric: She has a normal mood and affect. Her behavior is normal.  Nursing note and vitals reviewed.   ED Course  Procedures   DIAGNOSTIC STUDIES: Oxygen Saturation is 99% on RA, normal by my interpretation.    COORDINATION OF CARE: 1:26 PM Discussed treatment plan with pt at bedside and pt agreed to plan.  1:53 PM Upon re-evaluation, lungs clear with albuterol treatment. No wheezing noted. Albuterol/Atrovent Neb  treatment   MDM  24 y.o. female with cough and wheezing, congestion similar to her daughter's symptoms. Stable for d/c without respiratory distress and O2 SAT 99% on R/A. Will treat with Albuterol Inhaler, Tessalon and Prednisone. She will follow up with her PCP or return here for worsening symptoms.   Final diagnoses:  Bronchitis   I personally performed the services described in this documentation, which was scribed in my presence. The recorded information has been reviewed and is accurate.    [address] Dunbar CourtHope West PointM Neese, [place]NP 09/08/15 2035  Bethann BerkshireJoseph Zammit, MD 09/09/15 [phone]

## 2015-09-08 NOTE — ED Notes (Signed)
Pt comes in c/o cough, runny nose, congestion and wheezing x 2 days. Some diarrhea today. Denies fever, emesis. No meds pta. Lungs cta. Pt alert, appropriate in triage.

## 2015-09-08 NOTE — ED Notes (Signed)
Pt is in stable condition upon d/c and ambulates from ED. 

## 2015-12-08 ENCOUNTER — Ambulatory Visit: Payer: Medicaid Other | Admitting: Family Medicine

## 2016-01-06 ENCOUNTER — Other Ambulatory Visit (HOSPITAL_COMMUNITY)
Admission: RE | Admit: 2016-01-06 | Discharge: 2016-01-06 | Disposition: A | Discharge status: Home / Self Care | Payer: No Typology Code available for payment source | Source: Ambulatory Visit | Attending: Family Medicine | Admitting: Family Medicine

## 2016-01-06 ENCOUNTER — Ambulatory Visit (INDEPENDENT_AMBULATORY_CARE_PROVIDER_SITE_OTHER): Payer: Self-pay | Admitting: Family Medicine

## 2016-01-06 ENCOUNTER — Encounter: Payer: Self-pay | Admitting: Family Medicine

## 2016-01-06 VITALS — BP 104/61 | HR 94 | Temp 98.3°F | Ht 66.0 in | Wt 187.6 lb

## 2016-01-06 DIAGNOSIS — F4321 Adjustment disorder with depressed mood: Secondary | ICD-10-CM

## 2016-01-06 DIAGNOSIS — Z113 Encounter for screening for infections with a predominantly sexual mode of transmission: Secondary | ICD-10-CM | POA: Insufficient documentation

## 2016-01-06 DIAGNOSIS — Z3200 Encounter for pregnancy test, result unknown: Secondary | ICD-10-CM

## 2016-01-06 LAB — POCT URINE PREGNANCY: PREG TEST UR: NEGATIVE

## 2016-01-06 LAB — POCT WET PREP (WET MOUNT): Clue Cells Wet Prep Whiff POC: NEGATIVE

## 2016-01-06 NOTE — Patient Instructions (Signed)
If you feel like you need to establish care with a counselor, you can call either of these numbers.   [facility]:  [phone] [facility]: [phone]  We will let you know the results of your tests today.

## 2016-01-06 NOTE — Progress Notes (Signed)
Subjective: Alicia Dunlap is a 25 y.o. G1P1 presenting for STI and pregnancy check.  Ms. Burgoon discloses that her boyfriend died in an MVC earlier this month, and she has some depressive thoughts due to that. She's felt some nausea and "weird feeling" in her stomach, so she wondered if she was pregnant.Last sexual encounter with her only partner was 2/9, no barrier contraception used. Reports intermittent vaginal discharge, no bleeding, but LMP was last week. 2 home pregnancy tests were negative. Nexplanon has been in place since Aug 2015. She has many supportive family and friends to talk with and reports no history of depression.   - ROS: As above - Smoker  Objective: BP 104/61 mmHg  Pulse 94  Temp(Src) 98.3 F (36.8 C) (Oral)  Ht  (1.676 m)  Wt 187 lb 9.6 oz (85.095 kg)  BMI 30.29 kg/m2  LMP 12/22/2015 Gen: Well-appearing, overweight 25 y.o. female in no distress Skin: No rash on palms or soles Psych: Neatly groomed and appropriately dressed. Maintains good eye contact and is cooperative and attentive. Speech is normal volume and rate. Mood is depressed with a mildly restricted affect. Thought process is logical and goal directed. No suicidal or homicidal ideation. Does not appear to be responding to any internal stimuli. Able to maintain train of thought and concentrate on the questions.   PHQ-9: 2,1,1,2,1,1,0,0,0 = 8 "somewhat difficult"  Assessment/Plan: LAKEA MITTELMAN is a 25 y.o. female here for STI screening and grief reaction.  - UPT: Neg - Wet prep negative - GC/Chl PCR pending, will call with results and Rx Tx if necessary. Verified phone number and ok to left voicemail if no answer.  - Given phone numbers for counseling services and advised to follow up with PCP if no improvement in mood in the next weeks.

## 2016-01-07 LAB — URINE CYTOLOGY ANCILLARY ONLY
Chlamydia: NEGATIVE
NEISSERIA GONORRHEA: NEGATIVE

## 2016-02-05 ENCOUNTER — Emergency Department (HOSPITAL_COMMUNITY)
Admission: EM | Admit: 2016-02-05 | Discharge: 2016-02-05 | Disposition: A | Discharge status: Home / Self Care | Payer: No Typology Code available for payment source | Attending: Emergency Medicine | Admitting: Emergency Medicine

## 2016-02-05 ENCOUNTER — Encounter (HOSPITAL_COMMUNITY): Payer: Self-pay | Admitting: Emergency Medicine

## 2016-02-05 DIAGNOSIS — R1084 Generalized abdominal pain: Secondary | ICD-10-CM | POA: Insufficient documentation

## 2016-02-05 DIAGNOSIS — R6883 Chills (without fever): Secondary | ICD-10-CM | POA: Insufficient documentation

## 2016-02-05 DIAGNOSIS — Z8742 Personal history of other diseases of the female genital tract: Secondary | ICD-10-CM | POA: Insufficient documentation

## 2016-02-05 DIAGNOSIS — R112 Nausea with vomiting, unspecified: Secondary | ICD-10-CM | POA: Insufficient documentation

## 2016-02-05 DIAGNOSIS — Z880 Allergy status to penicillin: Secondary | ICD-10-CM | POA: Insufficient documentation

## 2016-02-05 DIAGNOSIS — Z3202 Encounter for pregnancy test, result negative: Secondary | ICD-10-CM | POA: Insufficient documentation

## 2016-02-05 LAB — BASIC METABOLIC PANEL
ANION GAP: 8 (ref 5–15)
BUN: 11 mg/dL (ref 6–20)
CALCIUM: 8.8 mg/dL — AB (ref 8.9–10.3)
CO2: 23 mmol/L (ref 22–32)
Chloride: 110 mmol/L (ref 101–111)
Creatinine, Ser: 0.68 mg/dL (ref 0.44–1.00)
GFR calc Af Amer: >60 mL/min (ref >60)
GFR calc non Af Amer: >60 mL/min (ref >60)
GLUCOSE: 98 mg/dL (ref 65–99)
POTASSIUM: 3.8 mmol/L (ref 3.5–5.1)
Sodium: 141 mmol/L (ref 135–145)

## 2016-02-05 LAB — HEPATIC FUNCTION PANEL
ALT: 12 U/L — AB (ref 14–54)
AST: 19 U/L (ref 15–41)
Albumin: 4.5 g/dL (ref 3.5–5.0)
Alkaline Phosphatase: 60 U/L (ref 38–126)
BILIRUBIN DIRECT: 0.1 mg/dL (ref 0.1–0.5)
BILIRUBIN TOTAL: 0.6 mg/dL (ref 0.3–1.2)
Indirect Bilirubin: 0.5 mg/dL (ref 0.3–0.9)
Total Protein: 7.4 g/dL (ref 6.5–8.1)

## 2016-02-05 LAB — CBC
HCT: 38 % (ref 36.0–46.0)
HEMOGLOBIN: 13.1 g/dL (ref 12.0–15.0)
MCH: 27.9 pg (ref 26.0–34.0)
MCHC: 34.5 g/dL (ref 30.0–36.0)
MCV: 81 fL (ref 78.0–100.0)
Platelets: 264 10*3/uL (ref 150–400)
RBC: 4.69 MIL/uL (ref 3.87–5.11)
RDW: 12.5 % (ref 11.5–15.5)
WBC: 11.2 10*3/uL — ABNORMAL HIGH (ref 4.0–10.5)

## 2016-02-05 LAB — I-STAT BETA HCG BLOOD, ED (MC, WL, AP ONLY)

## 2016-02-05 LAB — LIPASE, BLOOD: Lipase: 22 U/L (ref 11–51)

## 2016-02-05 MED ORDER — ONDANSETRON HCL 4 MG/2ML IJ SOLN
4.0000 mg | INTRAMUSCULAR | Status: AC
  Administered 2016-02-05: 4 mg via INTRAVENOUS
  Filled 2016-02-05: qty 2

## 2016-02-05 MED ORDER — SODIUM CHLORIDE 0.9 % IV BOLUS (SEPSIS)
1000.0000 mL | Freq: Once | INTRAVENOUS | Status: AC
  Administered 2016-02-05: 1000 mL via INTRAVENOUS

## 2016-02-05 MED ORDER — ONDANSETRON 4 MG PO TBDP: 4.0000 mg | ORAL_TABLET | Freq: Three times a day (TID) | ORAL | Status: DC | PRN

## 2016-02-05 NOTE — ED Notes (Addendum)
Per EMS-N/V around 0800-went out drinking last night-states she drank normal consumption and doesn't think it is related-bolus and 4 mg of Zofran given in route

## 2016-02-05 NOTE — ED Notes (Signed)
Awake. Verbally responsive. A/O x4. Resp even and unlabored. No audible adventitious breath sounds noted. ABC's intact.  

## 2016-02-05 NOTE — ED Notes (Signed)
Bed: [id number] Expected date:  Expected time:  Means of arrival:  Comments: EMS- 25yo,  N/V

## 2016-02-05 NOTE — Discharge Instructions (Signed)
1. Medications: zofran for nausea, usual home medications 2. Treatment: rest, drink plenty of fluids 3. Follow Up: please followup with your primary doctor for discussion of your diagnoses and further evaluation after today's visit; if you do not have a primary care doctor use the phone number listed in your discharge paperwork to find one; please return to the ER for high fever, severe pain, persistent vomiting, new or worsening symptoms   Nausea and Vomiting Nausea means you feel sick to your stomach. Throwing up (vomiting) is a reflex where stomach contents come out of your mouth. HOME CARE   Take medicine as told by your doctor.  Do not force yourself to eat. However, you do need to drink fluids.  If you feel like eating, eat a normal diet as told by your doctor.  Eat rice, wheat, potatoes, bread, lean meats, yogurt, fruits, and vegetables.  Avoid high-fat foods.  Drink enough fluids to keep your pee (urine) clear or pale yellow.  Ask your doctor how to replace body fluid losses (rehydrate). Signs of body fluid loss (dehydration) include:  Feeling very thirsty.  Dry lips and mouth.  Feeling dizzy.  Dark pee.  Peeing less than normal.  Feeling confused.  Fast breathing or heart rate. GET HELP RIGHT AWAY IF:   You have blood in your throw up.  You have black or bloody poop (stool).  You have a bad headache or stiff neck.  You feel confused.  You have bad belly (abdominal) pain.  You have chest pain or trouble breathing.  You do not pee at least once every 8 hours.  You have cold, clammy skin.  You keep throwing up after 24 to 48 hours.  You have a fever. MAKE SURE YOU:   Understand these instructions.  Will watch your condition.  Will get help right away if you are not doing well or get worse.   This information is not intended to replace advice given to you by your health care provider. Make sure you discuss any questions you have with your health  care provider.   Document Released: 04/17/2008 Document Revised: 01/22/2012 Document Reviewed: 03/31/2011 Elsevier Interactive Patient Education [employer]2016 Elsevier Inc.

## 2016-02-05 NOTE — ED Notes (Signed)
Tolerating PO fluids °

## 2016-02-05 NOTE — ED Provider Notes (Signed)
CSN: [id number]     Arrival date & time 02/05/16  1158 History   First MD Initiated Contact with Patient 02/05/16 1202     Chief Complaint  Patient presents with  . N/V     HPI   Alicia Dunlap is a 25 y.o. female with a PMH of BV who presents to the ED with nausea and vomiting, which she states started around 10 AM this morning. She notes 10 episodes of emesis since that time. She denies hematemesis. She denies eating anything out of the ordinary, though notes she drank 5 alcoholic drinks last night. She reports associated diffuse abdominal pain. She denies fever, diarrhea, constipation, dysuria, urgency, frequency.   Past Medical History  Diagnosis Date  . Bacterial vaginosis    History reviewed. No pertinent past surgical history. No family history on file. Social History  Substance Use Topics  . Smoking status: Never Smoker   . Smokeless tobacco: None  . Alcohol Use: No   OB History    Gravida Para Term Preterm AB TAB SAB Ectopic Multiple Living   1 1 1       1       Review of Systems  Constitutional: Positive for chills. Negative for fever.  Gastrointestinal: Positive for nausea, vomiting and abdominal pain. Negative for diarrhea and constipation.  Genitourinary: Negative for dysuria, urgency and frequency.  All other systems reviewed and are negative.     Allergies  Amoxicillin  Home Medications   Prior to Admission medications   Medication Sig Start Date End Date Taking? Authorizing Provider  etonogestrel (IMPLANON) 68 MG IMPL implant 1 each by Subdermal route once.   Yes Historical Provider, MD  benzonatate (TESSALON) 200 MG capsule Take 1 capsule (200 mg total) by mouth 3 (three) times daily as needed for cough. Patient not taking: Reported on 02/05/2016 09/08/15   Janne NapoleonHope M Neese, NP  ondansetron (ZOFRAN ODT) 4 MG disintegrating tablet Take 1 tablet (4 mg total) by mouth every 8 (eight) hours as needed for nausea. 02/05/16   Mady GemmaElizabeth C Westfall, PA-C   predniSONE (DELTASONE) 10 MG tablet Take 2 tablets (20 mg total) by mouth 2 (two) times daily with a meal. Patient not taking: Reported on 02/05/2016 09/08/15   Janne NapoleonHope M Neese, NP    BP 139/73 mmHg  Pulse 75  Temp(Src) 97.6 F (36.4 C) (Oral)  Resp 18  SpO2 99%  LMP 12/22/2015 Physical Exam  Constitutional: She is oriented to person, place, and time. She appears well-developed and well-nourished. No distress.  HENT:  Head: Normocephalic and atraumatic.  Right Ear: External ear normal.  Left Ear: External ear normal.  Nose: Nose normal.  Mouth/Throat: Uvula is midline, oropharynx is clear and moist and mucous membranes are normal.  Eyes: Conjunctivae, EOM and lids are normal. Pupils are equal, round, and reactive to light. Right eye exhibits no discharge. Left eye exhibits no discharge. No scleral icterus.  Neck: Normal range of motion. Neck supple.  Cardiovascular: Normal rate, regular rhythm, normal heart sounds, intact distal pulses and normal pulses.   Pulmonary/Chest: Effort normal and breath sounds normal. No respiratory distress. She has no wheezes. She has no rales.  Abdominal: Soft. Normal appearance and bowel sounds are normal. She exhibits no distension and no mass. There is no tenderness. There is no rigidity, no rebound and no guarding.  No significant TTP on exam. No rebound, guarding, or masses.  Musculoskeletal: Normal range of motion. She exhibits no edema or tenderness.  Neurological: She  is alert and oriented to person, place, and time.  Skin: Skin is warm, dry and intact. No rash noted. She is not diaphoretic. No erythema. No pallor.  Psychiatric: She has a normal mood and affect. Her speech is normal and behavior is normal.  Nursing note and vitals reviewed.   ED Course  Procedures (including critical care time)  Labs Review Labs Reviewed  CBC - Abnormal; Notable for the following:    WBC 11.2 (*)    All other components within normal limits  BASIC METABOLIC  PANEL - Abnormal; Notable for the following:    Calcium 8.8 (*)    All other components within normal limits  HEPATIC FUNCTION PANEL - Abnormal; Notable for the following:    ALT 12 (*)    All other components within normal limits  LIPASE, BLOOD  I-STAT BETA HCG BLOOD, ED ([facility], [facility], [facility] ONLY)    Imaging Review No results found.   I have personally reviewed and evaluated these lab results as part of my medical decision-making.   EKG Interpretation None      MDM   Final diagnoses:  Non-intractable vomiting with nausea, vomiting of unspecified type    25 year old female presents with N/V and mild abdominal pain. Patient is afebrile. Vital signs stable. Heart RRR. Lungs clear to auscultation bilaterally. Abdomen soft, non-tender, non-distended. Patient given fluids and antiemetic. CBC remarkable for leukocytosis of 11.2, likely reactive. BMP, hepatic function panel, and lipase unremarkable. Beta hCG negative. On reassessment of patient, she reports significant symptom improvement. She is able to tolerate PO intake. Patient is non-toxic and well-appearing, feel she is stable for discharge at this time. Will give zofran for home. Advised to increase fluid intake. Patient to follow-up with PCP. Return precautions discussed. Patient verbalizes her understanding and is in agreement with plan.  BP 139/73 mmHg  Pulse 75  Temp(Src) 97.6 F (36.4 C) (Oral)  Resp 18  SpO2 99%  LMP 12/22/2015      Mady Gemma, PA-C 02/05/16 1523  Melene Plan, DO 02/05/16 1546

## 2016-03-15 ENCOUNTER — Encounter: Payer: Medicaid Other | Admitting: Internal Medicine

## 2016-08-08 ENCOUNTER — Inpatient Hospital Stay (HOSPITAL_COMMUNITY)
Admission: AD | Admit: 2016-08-08 | Discharge: 2016-08-08 | Disposition: A | Discharge status: Home / Self Care | Payer: Medicaid Other | Source: Ambulatory Visit | Attending: Family Medicine | Admitting: Family Medicine

## 2016-08-08 ENCOUNTER — Encounter (HOSPITAL_COMMUNITY): Payer: Self-pay | Admitting: *Deleted

## 2016-08-08 DIAGNOSIS — K209 Esophagitis, unspecified without bleeding: Secondary | ICD-10-CM

## 2016-08-08 DIAGNOSIS — R112 Nausea with vomiting, unspecified: Secondary | ICD-10-CM

## 2016-08-08 DIAGNOSIS — Z880 Allergy status to penicillin: Secondary | ICD-10-CM | POA: Insufficient documentation

## 2016-08-08 DIAGNOSIS — Z3202 Encounter for pregnancy test, result negative: Secondary | ICD-10-CM | POA: Insufficient documentation

## 2016-08-08 DIAGNOSIS — R079 Chest pain, unspecified: Secondary | ICD-10-CM | POA: Insufficient documentation

## 2016-08-08 DIAGNOSIS — R1110 Vomiting, unspecified: Secondary | ICD-10-CM | POA: Insufficient documentation

## 2016-08-08 LAB — CBC
HEMATOCRIT: 33.8 % — AB (ref 36.0–46.0)
HEMOGLOBIN: 11.9 g/dL — AB (ref 12.0–15.0)
MCH: 28.5 pg (ref 26.0–34.0)
MCHC: 35.2 g/dL (ref 30.0–36.0)
MCV: 81.1 fL (ref 78.0–100.0)
Platelets: 286 10*3/uL (ref 150–400)
RBC: 4.17 MIL/uL (ref 3.87–5.11)
RDW: 12.7 % (ref 11.5–15.5)
WBC: 8.2 10*3/uL (ref 4.0–10.5)

## 2016-08-08 LAB — URINALYSIS, ROUTINE W REFLEX MICROSCOPIC
BILIRUBIN URINE: NEGATIVE
GLUCOSE, UA: NEGATIVE mg/dL
HGB URINE DIPSTICK: NEGATIVE
KETONES UR: 15 mg/dL — AB
LEUKOCYTES UA: NEGATIVE
Nitrite: NEGATIVE
PH: 6 (ref 5.0–8.0)
Protein, ur: NEGATIVE mg/dL
Specific Gravity, Urine: 1.02 (ref 1.005–1.030)

## 2016-08-08 LAB — POCT PREGNANCY, URINE: PREG TEST UR: NEGATIVE

## 2016-08-08 MED ORDER — GI COCKTAIL ~~LOC~~
30.0000 mL | Freq: Once | ORAL | Status: AC
  Administered 2016-08-08: 30 mL via ORAL
  Filled 2016-08-08: qty 30

## 2016-08-08 MED ORDER — PANTOPRAZOLE SODIUM 20 MG PO TBEC
20.0000 mg | DELAYED_RELEASE_TABLET | Freq: Every day | ORAL | 0 refills | Status: DC
Start: 2016-08-08 — End: 2017-01-24

## 2016-08-08 MED ORDER — PROMETHAZINE HCL 25 MG PO TABS: 25.0000 mg | ORAL_TABLET | Freq: Four times a day (QID) | ORAL | 0 refills | Status: DC | PRN

## 2016-08-08 NOTE — MAU Provider Note (Signed)
Chief Complaint:  No chief complaint on file.   First Provider Initiated Contact with Patient 08/08/16 2022       HPI: Alicia Dunlap is a 25 y.o. G1P1001 who presents to maternity admissions reporting vomiting for the past two mornings.  States wakes up and feels like she needs to vomit, so she induces vomiting on herself.  No vomiting the rest of the day.  Able to eat salad and drink water.  When asked about low abd pain (which she signed in with), states she does not have any abdominal pain, except for muscle soreness after vomiting.  No diarrhea or constipation. . She reports no vaginal bleeding, vaginal itching/burning, urinary symptoms, h/a, dizziness, or fever/chills.  Boyfriend has "mono" but she has had no fever or sore throat.  States came here because [name] "line was too long".  Wants a work note for tonight  Emesis   This is a new problem. The current episode started yesterday. The problem occurs less than 2 times per day. The problem has been unchanged. The emesis has an appearance of bile. There has been no fever. Associated symptoms include chest pain (after vomiting). Pertinent negatives include no abdominal pain, chills, coughing, diarrhea, dizziness, fever, headaches, myalgias or URI. She has tried nothing for the symptoms.   RN Note: Pt reports she has had vomited in the mornings for the last 3 days, lower abd pain today, and mid sternal chest pain that radiates to her back.   Past Medical History: Past Medical History:  Diagnosis Date  . Bacterial vaginosis     Past obstetric history: OB History  Gravida Para Term Preterm AB Living  1 1 1     1   SAB TAB Ectopic Multiple Live Births               # Outcome Date GA Lbr Len/2nd Weight Sex Delivery Anes PTL Lv  1 Term               Past Surgical History: Past Surgical History:  Procedure Laterality Date  . NO PAST SURGERIES      Family History: History reviewed. No pertinent family history.  Social  History: Social History  Substance Use Topics  . Smoking status: Never Smoker  . Smokeless tobacco: Never Used  . Alcohol use No    Allergies:  Allergies  Allergen Reactions  . Amoxicillin Rash    Has patient had a PCN reaction causing immediate rash, facial/tongue/throat swelling, SOB or lightheadedness with hypotension: no Has patient had a PCN reaction causing severe rash involving mucus membranes or skin necrosis: no Has patient had a PCN reaction that required hospitalization no Has patient had a PCN reaction occurring within the last 10 years: yes If all of the above answers are "NO", then may proceed with Cephalosporin use.     Meds:  Facility-Administered Medications Prior to Admission  Medication Dose Route Frequency Provider Last Rate Last Dose  . Etonogestrel IMPL 1 each  1 each Subcutaneous Once Kristen Cardinal, MD       Prescriptions Prior to Admission  Medication Sig Dispense Refill Last Dose  . Biotin w/ Vitamins C & E (HAIR SKIN & NAILS GUMMIES PO) Take 2 each by mouth daily.   08/08/2016 at Unknown time  . etonogestrel (IMPLANON) 68 MG IMPL implant 1 each by Subdermal route once.   Continuous    I have reviewed patient's Past Medical Hx, Surgical Hx, Family Hx, Social Hx, medications and  allergies.  ROS:  Review of Systems  Constitutional: Negative for chills and fever.  Respiratory: Negative for cough.   Cardiovascular: Positive for chest pain (after vomiting).  Gastrointestinal: Positive for vomiting. Negative for abdominal pain and diarrhea.  Musculoskeletal: Negative for myalgias.  Neurological: Negative for dizziness and headaches.   Other systems negative     Physical Exam  Patient Vitals for the past 24 hrs:  BP Temp Temp src Pulse Resp SpO2 Height Weight  08/08/16 1951 121/76 98.5 F (36.9 C) Oral 77 18 100 % 5' 6.5" (1.689 m) 191 lb (86.6 kg)   Constitutional: Well-developed, well-nourished female in no acute distress.  Cardiovascular:  normal rate and rhythm, no ectopy audible, S1 & S2 heard, no murmur Respiratory: normal effort, no distress. Lungs CTAB with no wheezes or crackles GI: Abd soft, non-tender.  Nondistended.  No rebound, No guarding.  Bowel Sounds audible  MS: Extremities nontender, no edema, normal ROM Neurologic: Alert and oriented x 4.   Grossly nonfocal. GU: Neg CVAT. Skin:  Warm and Dry Psych:  Affect appropriate. Declines pelvic exam    Labs: Results for orders placed or performed during the hospital encounter of 08/08/16 (from the past 24 hour(s))  Urinalysis, Routine w reflex microscopic (not [facility])     Status: Abnormal   Collection Time: 08/08/16  7:46 PM  Result Value Ref Range   Color, Urine YELLOW YELLOW   APPearance CLEAR CLEAR   Specific Gravity, Urine 1.020 1.005 - 1.030   pH 6.0 5.0 - 8.0   Glucose, UA NEGATIVE NEGATIVE mg/dL   Hgb urine dipstick NEGATIVE NEGATIVE   Bilirubin Urine NEGATIVE NEGATIVE   Ketones, ur 15 (A) NEGATIVE mg/dL   Protein, ur NEGATIVE NEGATIVE mg/dL   Nitrite NEGATIVE NEGATIVE   Leukocytes, UA NEGATIVE NEGATIVE  Pregnancy, urine POC     Status: None   Collection Time: 08/08/16  8:21 PM  Result Value Ref Range   Preg Test, Ur NEGATIVE NEGATIVE  CBC     Status: Abnormal   Collection Time: 08/08/16  8:40 PM  Result Value Ref Range   WBC 8.2 4.0 - 10.5 K/uL   RBC 4.17 3.87 - 5.11 MIL/uL   Hemoglobin 11.9 (L) 12.0 - 15.0 g/dL   HCT 16.133.8 (L) 09.636.0 - 04.546.0 %   MCV 81.1 78.0 - 100.0 fL   MCH 28.5 26.0 - 34.0 pg   MCHC 35.2 30.0 - 36.0 g/dL   RDW 40.912.7 81.111.5 - 91.415.5 %   Platelets 286 150 - 400 K/uL   EKG:  Sinus rhythm with sinus arrhythmia Imaging:  No results found.  MAU Course/MDM: I have ordered labs as follows:  UA, CBC, EKG Imaging ordered: none Results reviewed.  Treatments in MAU included GI cocktail which provided excellent relief of chest pain.  No vomiting while here..   Pt stable at time of discharge.  Assessment: Nausea in  mornings Vomiting, self induced Chest pain, likely related to acid esophagitis from inducing vomiting Normal EKG  Plan: Discharge home Recommend Advance diet as tolerated Rx sent for phenergan for nausea Rx Protonix for acid refluix Advised to seek Family Medicine doctor for care   Encouraged to return here or to other Urgent Care/ED if she develops worsening of symptoms, increase in pain, fever, or other concerning symptoms.   Wynelle BourgeoisMarie Williams CNM, MSN Certified Nurse-Midwife 08/08/2016 8:31 PM

## 2016-08-08 NOTE — Discharge Instructions (Signed)

## 2016-08-08 NOTE — MAU Note (Signed)
Pt reports she has had vomited in the mornings for the last 3 days, lower abd pain today, and mid sternal chest pain that radiates to her back.

## 2016-08-27 ENCOUNTER — Encounter (HOSPITAL_BASED_OUTPATIENT_CLINIC_OR_DEPARTMENT_OTHER): Payer: Self-pay | Admitting: Emergency Medicine

## 2016-08-27 ENCOUNTER — Emergency Department (HOSPITAL_BASED_OUTPATIENT_CLINIC_OR_DEPARTMENT_OTHER): Payer: Self-pay

## 2016-08-27 ENCOUNTER — Emergency Department (HOSPITAL_BASED_OUTPATIENT_CLINIC_OR_DEPARTMENT_OTHER)
Admission: EM | Admit: 2016-08-27 | Discharge: 2016-08-27 | Disposition: A | Discharge status: Home / Self Care | Payer: Self-pay | Attending: Emergency Medicine | Admitting: Emergency Medicine

## 2016-08-27 DIAGNOSIS — L03116 Cellulitis of left lower limb: Secondary | ICD-10-CM | POA: Insufficient documentation

## 2016-08-27 DIAGNOSIS — M7989 Other specified soft tissue disorders: Secondary | ICD-10-CM

## 2016-08-27 DIAGNOSIS — W57XXXA Bitten or stung by nonvenomous insect and other nonvenomous arthropods, initial encounter: Secondary | ICD-10-CM | POA: Insufficient documentation

## 2016-08-27 DIAGNOSIS — Y939 Activity, unspecified: Secondary | ICD-10-CM | POA: Insufficient documentation

## 2016-08-27 DIAGNOSIS — L03818 Cellulitis of other sites: Secondary | ICD-10-CM

## 2016-08-27 DIAGNOSIS — Y929 Unspecified place or not applicable: Secondary | ICD-10-CM | POA: Insufficient documentation

## 2016-08-27 DIAGNOSIS — Y999 Unspecified external cause status: Secondary | ICD-10-CM | POA: Insufficient documentation

## 2016-08-27 MED ORDER — CEPHALEXIN 500 MG PO CAPS: 500.0000 mg | ORAL_CAPSULE | Freq: Four times a day (QID) | ORAL | 0 refills | Status: AC

## 2016-08-27 MED ORDER — DIPHENHYDRAMINE HCL 25 MG PO CAPS: 25.0000 mg | ORAL_CAPSULE | Freq: Four times a day (QID) | ORAL | 0 refills | Status: DC | PRN

## 2016-08-27 NOTE — ED Provider Notes (Signed)
[facility] Provider Note   CSN: [id number] Arrival date & time: 08/27/16  1334  By signing my name below, I, Alicia Dunlap, attest that this documentation has been prepared under the direction and in the presence of [name], MD. Electronically Signed: Rosario Dunlap, ED Scribe. 08/27/16. 4:10 PM.  History   Chief Complaint Chief Complaint  Patient presents with  . Foot Swelling   The history is provided by the patient. No language interpreter was used.   HPI Comments: Alicia Dunlap is a 25 y.o. female with no pertinent PMHx, who presents to the Emergency Department complaining of gradual onset, gradually worsening right foot swelling onset ~1 day ago. She notes moderate pain, increased warmth and redness to the area and she states that that area is additinoally pruritic. No hx of trauma or injury to the foot. No recent wound involvement to the foot. Pt reports that she had some isolated itchiness between her toes ~1 week which she states has resolved and that she may have been bit by a mosquito; however she did not see any insects/ticks on her skin prior to the onset of her symptoms. Her pain to the area is exacerbated with ambulation. No noted treatments were tried prior to coming into the ED. Pt does not take medications daily for any reason. Denies fever, chills, abdominal pain, nausea, vomiting, leg swelling, bower extremity pain, or any other associated symptoms.   Past Medical History:  Diagnosis Date  . Bacterial vaginosis    Patient Active Problem List   Diagnosis Date Noted  . Nausea and vomiting 06/03/2014  . Insertion of Nexplanon 06/03/2014  . Unspecified contraceptive management 06/03/2014  . High risk sexual behavior 02/12/2013  . Drug abuse, marijuana 09/06/2012  . Seasonal allergies 02/22/2012  . 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 Encounter for Implanon removal 07/06/2011  . DENTAL CARIES 08/11/2010   Past Surgical History:    Procedure Laterality Date  . NO PAST SURGERIES     OB History    Gravida Para Term Preterm AB Living   1 1 1     1    SAB TAB Ectopic Multiple Live Births                 Home Medications    Prior to Admission medications   Medication Sig Start Date End Date Taking? Authorizing Provider  Biotin w/ Vitamins C & E (HAIR SKIN & NAILS GUMMIES PO) Take 2 each by mouth daily.    Historical Provider, MD  cephALEXin (KEFLEX) 500 MG capsule Take 1 capsule (500 mg total) by mouth 4 (four) times daily. 08/27/16 09/03/16  [name], MD  diphenhydrAMINE (BENADRYL) 25 mg capsule Take 1 capsule (25 mg total) by mouth every 6 (six) hours as needed for itching. 08/27/16   [name], MD  etonogestrel (IMPLANON) 68 MG IMPL implant 1 each by Subdermal route once.    Historical Provider, MD  pantoprazole (PROTONIX) 20 MG tablet Take 1 tablet (20 mg total) by mouth daily. 08/08/16   Aviva Signs, CNM  promethazine (PHENERGAN) 25 MG tablet Take 1 tablet (25 mg total) by mouth every 6 (six) hours as needed for nausea. 11/18/11 11/25/11  Roma Kayser Schorr, NP  promethazine (PHENERGAN) 25 MG tablet Take 1 tablet (25 mg total) by mouth every 6 (six) hours as needed for nausea or vomiting. 08/08/16   Aviva Signs, CNM   Family History No family history on file.  Social  History Social History  Substance Use Topics  . Smoking status: Never Smoker  . Smokeless tobacco: Never Used  . Alcohol use No   Allergies   Amoxicillin  Review of Systems Review of Systems 10/14 systems reviewed and are negative other than those stated in the HPI  Physical Exam Updated Vital Signs BP 119/90 (BP Location: Right Arm)   Pulse 78   Temp 98.2 F (36.8 C) (Oral)   Resp 18   Ht 5\' 7"  (1.702 m)   Wt 189 lb (85.7 kg)   LMP 08/21/2016   SpO2 99%   BMI 29.60 kg/m   Physical Exam Physical Exam  Nursing note and vitals reviewed. Constitutional: Well developed, well nourished, non-toxic, and in no acute  distress Head: Normocephalic and atraumatic.  Mouth/Throat: Oropharynx is clear and moist.  Neck: Normal range of motion. Neck supple.  Cardiovascular: Normal rate and regular rhythm.   Pulmonary/Chest: Effort normal and breath sounds normal.  Abdominal: Soft. There is no tenderness. There is no rebound and no guarding.  Musculoskeletal: Normal range of motion. Soft tissue swelling over the dorsum of the right foot. Over the lateral aspect to the right foot there are two insect bites. Surrounding erythema and warmth to the area. No induration or underlying fluctuance.  Neurological: Alert, no facial droop, fluent speech, moves all extremities symmetrically Skin: Skin is warm and dry.  Psychiatric: Cooperative  ED Treatments / Results  DIAGNOSTIC STUDIES: Oxygen Saturation is 100% on RA, normal by my interpretation.   COORDINATION OF CARE: 4:10 PM-Discussed next steps with pt. Pt verbalized understanding and is agreeable with the plan.   Labs (all labs ordered are listed, but only abnormal results are displayed) Labs Reviewed - No data to display  EKG  EKG Interpretation None      Radiology Dg Foot Complete Right  Result Date: 08/27/2016 CLINICAL DATA:  Right foot pain, redness and swelling for 1 day. No known injury. Initial encounter. EXAM: RIGHT FOOT COMPLETE - 3+ VIEW COMPARISON:  None. FINDINGS: No fracture, subluxation or dislocation identified. No focal bony lesions are present. Dorsal soft tissue swelling is noted. No radiopaque foreign body identified. IMPRESSION: Soft tissue swelling without bony abnormality. Electronically Signed   By: Harmon PierJeffrey  Hu M.D.   On: 08/27/2016 14:21   Procedures Procedures   Medications Ordered in ED Medications - No data to display  Initial Impression / Assessment and Plan / ED Course  I have reviewed the triage vital signs and the nursing notes.  Pertinent labs & imaging results that were available during my care of the patient were  reviewed by me and considered in my medical decision making (see chart for details).  Clinical Course   25 year old female who presents with swelling, redness and pain to the right foot. Nontoxic in no acute distress. Vital signs within normal limits. She does have soft tissue swelling over the dorsum of the right foot. There are 2 bumps suggestive of insect bites and there is surrounding erythema and warmth over the dorsum of the foot. Likely some overlying cellulitis. No systemic signs or symptoms of illness. We'll treat with course Keflex and Benadryl for itching as needed.   The patient appears reasonably screened and/or stabilized for discharge and I doubt any other medical condition or other [facility] requiring further screening, evaluation, or treatment in the ED at this time prior to discharge.  Strict return and follow-up instructions reviewed. She expressed understanding of all discharge instructions and felt comfortable with the plan  of care.   Final Clinical Impressions(s) / ED Diagnoses   Final diagnoses:  Foot swelling  Cellulitis of other specified site  Insect bite, initial encounter   New Prescriptions Discharge Medication List as of 08/27/2016  4:16 PM    START taking these medications   Details  cephALEXin (KEFLEX) 500 MG capsule Take 1 capsule (500 mg total) by mouth 4 (four) times daily., Starting Sun 08/27/2016, Until Sun 09/03/2016, Print    diphenhydrAMINE (BENADRYL) 25 mg capsule Take 1 capsule (25 mg total) by mouth every 6 (six) hours as needed for itching., Starting Sun 08/27/2016, Print       I personally performed the services described in this documentation, which was scribed in my presence. The recorded information has been reviewed and is accurate.     [name], MD 08/28/16 [id number]

## 2016-08-27 NOTE — Discharge Instructions (Signed)
Please take antibiotics as prescribed. you can also take Benadryl as needed for itching.  Please return without fail for worsening symptoms, including worsening redness and swelling, fever, or any other symptoms concerning to you.

## 2016-08-27 NOTE — ED Notes (Signed)
Blanchable redness noted to right foot.  Pt states painful to touch and sore while at rest.  Pt states 1 week ago, right foot was itching and she tx'd at home with antifungal cream.  Then yesterday right foot swelling and pain started and has progressively worsened since.

## 2016-08-27 NOTE — ED Triage Notes (Signed)
Pt has redness and swelling to R foot with mild pain since yesterday. No known injury or insect bite.

## 2016-11-20 ENCOUNTER — Emergency Department (HOSPITAL_COMMUNITY)
Admission: EM | Admit: 2016-11-20 | Discharge: 2016-11-20 | Discharge status: Against Medical Advice | Payer: Medicaid Other

## 2016-11-20 NOTE — ED Notes (Signed)
Called third time to triage.

## 2016-11-20 NOTE — ED Notes (Signed)
Attempted to call for Triage w/o answer x 1.

## 2017-01-24 ENCOUNTER — Ambulatory Visit (HOSPITAL_COMMUNITY)
Admission: EM | Admit: 2017-01-24 | Discharge: 2017-01-24 | Disposition: A | Discharge status: Home / Self Care | Payer: Medicaid Other | Attending: Family Medicine | Admitting: Family Medicine

## 2017-01-24 ENCOUNTER — Encounter (HOSPITAL_COMMUNITY): Payer: Self-pay | Admitting: Family Medicine

## 2017-01-24 DIAGNOSIS — K047 Periapical abscess without sinus: Secondary | ICD-10-CM

## 2017-01-24 MED ORDER — HYDROCODONE-ACETAMINOPHEN 5-325 MG PO TABS: 1.0000 | ORAL_TABLET | Freq: Four times a day (QID) | ORAL | 0 refills | Status: DC | PRN

## 2017-01-24 MED ORDER — HYDROCODONE-ACETAMINOPHEN 5-325 MG PO TABS
ORAL_TABLET | ORAL | Status: AC
  Filled 2017-01-24: qty 1

## 2017-01-24 MED ORDER — CLINDAMYCIN HCL 150 MG PO CAPS: 150.0000 mg | ORAL_CAPSULE | Freq: Three times a day (TID) | ORAL | 0 refills | Status: DC

## 2017-01-24 MED ORDER — HYDROCODONE-ACETAMINOPHEN 5-325 MG PO TABS
1.0000 | ORAL_TABLET | Freq: Once | ORAL | Status: AC
Start: 2017-01-24 — End: 2017-01-24
  Administered 2017-01-24: 1 via ORAL

## 2017-01-24 NOTE — Discharge Instructions (Signed)
For your dental pain, prescribed 2 different medicines. The first is clindamycin, take one tablet 3 times a day. I have also prescribed a medicine for pain called hydrocodone, this medicine is a narcotic, it will cause drowsiness, and it is addictive. Do not take more than what is necessary, do not drink alcohol while taking, and do not operate any heavy machinery while taking this medicine. I also recommend taking over-the-counter ibuprofen along with these 2 medicines every 8 hours. you will need to follow up with a dentist as soon as possible otherwise your pain will return

## 2017-01-24 NOTE — ED Provider Notes (Signed)
CSN: [id number]     Arrival date & time 01/24/17  1838 History   First MD Initiated Contact with Patient 01/24/17 2017     Chief Complaint  Patient presents with  . Dental Pain   (Consider location/radiation/quality/duration/timing/severity/associated sxs/prior Treatment) Patient here for dental pain ongoing for several days.   The history is provided by the patient.  Dental Pain  Location:  Upper and lower Upper teeth location:  1/RU 3rd molar Lower teeth location:  31/RL 2nd molar Quality:  Constant, pulsating, pressure-like, sharp and shooting Severity:  Severe Onset quality:  Gradual Duration:  2 days Timing:  Constant Progression:  Worsening Chronicity:  New Context: abscess, dental caries and poor dentition   Context: not trauma   Relieved by:  Nothing Worsened by:  Cold food/drink, hot food/drink, touching, jaw movement and pressure Ineffective treatments:  Acetaminophen and NSAIDs Associated symptoms: facial swelling   Associated symptoms: no congestion, no difficulty swallowing, no drooling, no facial pain, no fever, no headaches, no neck pain, no neck swelling, no oral bleeding, no oral lesions and no trismus   Risk factors: lack of dental care     Past Medical History:  Diagnosis Date  . Bacterial vaginosis    Past Surgical History:  Procedure Laterality Date  . NO PAST SURGERIES     History reviewed. No pertinent family history. Social History  Substance Use Topics  . Smoking status: Never Smoker  . Smokeless tobacco: Never Used  . Alcohol use No   OB History    Gravida Para Term Preterm AB Living   1 1 1     1    SAB TAB Ectopic Multiple Live Births                 Review of Systems  Reason unable to perform ROS: as covered in HPI.  Constitutional: Negative for fever.  HENT: Positive for facial swelling. Negative for congestion, drooling and mouth sores.   Musculoskeletal: Negative for neck pain.  Neurological: Negative for headaches.  All  other systems reviewed and are negative.   Allergies  Amoxicillin  Home Medications   Prior to Admission medications   Medication Sig Start Date End Date Taking? Authorizing Provider  Biotin w/ Vitamins C & E (HAIR SKIN & NAILS GUMMIES PO) Take 2 each by mouth daily.    Historical Provider, MD  clindamycin (CLEOCIN) 150 MG capsule Take 1 capsule (150 mg total) by mouth 3 (three) times daily. 01/24/17   Dorena Bodo, NP  etonogestrel (IMPLANON) 68 MG IMPL implant 1 each by Subdermal route once.    Historical Provider, MD  HYDROcodone-acetaminophen (NORCO/VICODIN) 5-325 MG tablet Take 1-2 tablets by mouth every 6 (six) hours as needed. 01/24/17   Dorena Bodo, NP   Meds Ordered and Administered this Visit   Medications  HYDROcodone-acetaminophen (NORCO/VICODIN) 5-325 MG per tablet 1 tablet (1 tablet Oral Given 01/24/17 1931)    BP 137/75   Pulse 88   Temp 99.1 F (37.3 C)   Resp 18   SpO2 100%  No data found.   Physical Exam  Constitutional: She is oriented to person, place, and time. She appears well-developed and well-nourished. She appears distressed.  HENT:  Mouth/Throat: Uvula is midline, oropharynx is clear and moist and mucous membranes are normal.    Neck: Normal range of motion. Neck supple.  Cardiovascular: Normal rate and regular rhythm.   Pulmonary/Chest: Effort normal and breath sounds normal.  Lymphadenopathy:       Head (right  side): No submental, no submandibular, no tonsillar and no preauricular adenopathy present.       Head (left side): No submental, no submandibular, no tonsillar and no preauricular adenopathy present.    She has no cervical adenopathy.  Neurological: She is alert and oriented to person, place, and time.  Skin: Skin is warm. Capillary refill takes less than 2 seconds. She is not diaphoretic.  Psychiatric: She has a normal mood and affect. Her behavior is normal.  Nursing note and vitals reviewed.   Urgent Care Course      Procedures (including critical care time)  Labs Review Labs Reviewed - No data to display  Imaging Review No results found.     MDM   1. Dental infection    Prescription for clindamycin, hydrocodone given to the patient. Patient was strongly encouraged to follow up with dental care soon as possible for further management. She is also advised that her pain would continue to come back until she seen by a dentist.  [facility]  controlled substances reporting system consulted prior to writing prescription for hydrocodone.     Dorena BodoLawrence Anan Dapolito, NP 01/24/17 2034

## 2017-01-24 NOTE — ED Triage Notes (Signed)
Pt here for severe dental pain. sts right lower back around wisdom teeth.

## 2017-05-21 ENCOUNTER — Ambulatory Visit (INDEPENDENT_AMBULATORY_CARE_PROVIDER_SITE_OTHER): Payer: Self-pay | Admitting: Internal Medicine

## 2017-05-21 ENCOUNTER — Encounter: Payer: Self-pay | Admitting: Internal Medicine

## 2017-05-21 VITALS — BP 108/64 | HR 65 | Temp 98.3°F | Ht 67.0 in | Wt 166.2 lb

## 2017-05-21 DIAGNOSIS — Z30013 Encounter for initial prescription of injectable contraceptive: Secondary | ICD-10-CM

## 2017-05-21 DIAGNOSIS — Z3046 Encounter for surveillance of implantable subdermal contraceptive: Secondary | ICD-10-CM

## 2017-05-21 LAB — POCT URINE PREGNANCY: PREG TEST UR: NEGATIVE

## 2017-05-21 MED ORDER — MEDROXYPROGESTERONE ACETATE 150 MG/ML IM SUSP: 150.0000 mg | Freq: Once | INTRAMUSCULAR | Status: DC

## 2017-05-21 MED ORDER — MEDROXYPROGESTERONE ACETATE 150 MG/ML IM SUSY
150.0000 mg | PREFILLED_SYRINGE | Freq: Once | INTRAMUSCULAR | Status: AC
  Administered 2017-05-21: 150 mg via INTRAMUSCULAR

## 2017-05-21 NOTE — Patient Instructions (Signed)
Some swelling or discomfort at the removal site is common for 24-48 hours after removal. Minimal bleeding is also common. If you experience redness that extends on the surrounding skin, drainage from the area, significant bleeding, or fevers/nausea/vomiting you need to be seen.    Medroxyprogesterone injection [Contraceptive] What is this medicine? MEDROXYPROGESTERONE (me DROX ee proe JES te rone) contraceptive injections prevent pregnancy. They provide effective birth control for 3 months. Depo-subQ Provera 104 is also used for treating pain related to endometriosis. This medicine may be used for other purposes; ask your health care provider or pharmacist if you have questions. COMMON BRAND NAME(S): Depo-Provera, Depo-subQ Provera 104 What should I tell my health care provider before I take this medicine? They need to know if you have any of these conditions: -frequently drink alcohol -asthma -blood vessel disease or a history of a blood clot in the lungs or legs -bone disease such as osteoporosis -breast cancer -diabetes -eating disorder (anorexia nervosa or bulimia) -high blood pressure -HIV infection or AIDS -kidney disease -liver disease -mental depression -migraine -seizures (convulsions) -stroke -tobacco smoker -vaginal bleeding -an unusual or allergic reaction to medroxyprogesterone, other hormones, medicines, foods, dyes, or preservatives -pregnant or trying to get pregnant -breast-feeding How should I use this medicine? Depo-Provera Contraceptive injection is given into a muscle. Depo-subQ Provera 104 injection is given under the skin. These injections are given by a health care professional. You must not be pregnant before getting an injection. The injection is usually given during the first 5 days after the start of a menstrual period or 6 weeks after delivery of a baby. Talk to your pediatrician regarding the use of this medicine in children. Special care may be needed.  These injections have been used in female children who have started having menstrual periods. Overdosage: If you think you have taken too much of this medicine contact a poison control center or emergency room at once. NOTE: This medicine is only for you. Do not share this medicine with others. What if I miss a dose? Try not to miss a dose. You must get an injection once every 3 months to maintain birth control. If you cannot keep an appointment, call and reschedule it. If you wait longer than 13 weeks between Depo-Provera contraceptive injections or longer than 14 weeks between Depo-subQ Provera 104 injections, you could get pregnant. Use another method for birth control if you miss your appointment. You may also need a pregnancy test before receiving another injection. What may interact with this medicine? Do not take this medicine with any of the following medications: -bosentan This medicine may also interact with the following medications: -aminoglutethimide -antibiotics or medicines for infections, especially rifampin, rifabutin, rifapentine, and griseofulvin -aprepitant -barbiturate medicines such as phenobarbital or primidone -bexarotene -carbamazepine -medicines for seizures like ethotoin, felbamate, oxcarbazepine, phenytoin, topiramate -modafinil -St. John's wort This list may not describe all possible interactions. Give your health care provider a list of all the medicines, herbs, non-prescription drugs, or dietary supplements you use. Also tell them if you smoke, drink alcohol, or use illegal drugs. Some items may interact with your medicine. What should I watch for while using this medicine? This drug does not protect you against HIV infection (AIDS) or other sexually transmitted diseases. Use of this product may cause you to lose calcium from your bones. Loss of calcium may cause weak bones (osteoporosis). Only use this product for more than 2 years if other forms of birth control  are not right for you.  The longer you use this product for birth control the more likely you will be at risk for weak bones. Ask your health care professional how you can keep strong bones. You may have a change in bleeding pattern or irregular periods. Many females stop having periods while taking this drug. If you have received your injections on time, your chance of being pregnant is very low. If you think you may be pregnant, see your health care professional as soon as possible. Tell your health care professional if you want to get pregnant within the next year. The effect of this medicine may last a long time after you get your last injection. What side effects may I notice from receiving this medicine? Side effects that you should report to your doctor or health care professional as soon as possible: -allergic reactions like skin rash, itching or hives, swelling of the face, lips, or tongue -breast tenderness or discharge -breathing problems -changes in vision -depression -feeling faint or lightheaded, falls -fever -pain in the abdomen, chest, groin, or leg -problems with balance, talking, walking -unusually weak or tired -yellowing of the eyes or skin Side effects that usually do not require medical attention (report to your doctor or health care professional if they continue or are bothersome): -acne -fluid retention and swelling -headache -irregular periods, spotting, or absent periods -temporary pain, itching, or skin reaction at site where injected -weight gain This list may not describe all possible side effects. Call your doctor for medical advice about side effects. You may report side effects to FDA at 1-800-FDA-1088. Where should I keep my medicine? This does not apply. The injection will be given to you by a health care professional. NOTE: This sheet is a summary. It may not cover all possible information. If you have questions about this medicine, talk to your doctor,  pharmacist, or health care provider.  2018 Elsevier/Gold Standard (2008-11-20 18:37:56)

## 2017-05-21 NOTE — Progress Notes (Signed)
   Subjective:    Alicia Dunlap - 26 y.o. female MRN [id number]  Date of birth: 1991/04/12  HPI  Alicia HolsterLauren A Buccheri is here for contraception management. Wishes to have Nexplanon removed that was inserted in July 2015. Wants it removed due to unpredictable periods. Wishes to switch to DepoProvera for contraception. This has worked well for her in the past.    -  reports that she has never smoked. She has never used smokeless tobacco. - Review of Systems: Per HPI. - Past Medical History: Patient Active Problem List   Diagnosis Date Noted  . Nausea and vomiting 06/03/2014  . Insertion of Nexplanon 06/03/2014  . Unspecified contraceptive management 06/03/2014  . High risk sexual behavior 02/12/2013  . Drug abuse, marijuana 09/06/2012  . Seasonal allergies 02/22/2012  . 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 Encounter for Implanon removal 07/06/2011  . DENTAL CARIES 08/11/2010   - Medications: reviewed and updated   Objective:   Physical Exam BP 108/64   Pulse 65   Temp 98.3 F (36.8 C) (Oral)   Ht 5\' 7"  (1.702 m)   Wt 166 lb 3.2 oz (75.4 kg)   LMP 05/07/2017 (Approximate)   SpO2 97%   BMI 26.03 kg/m  Gen: NAD, alert, cooperative with exam, well-appearing  PROCEDURE NOTE: NEXPLANON  REMOVAL Patient given informed consent and signed copy in the chart. Left arm area prepped and draped in the usual sterile fashion. Five cc of lidocaine without epinephrine 1% used for local anesthesia. A small stab incision was made close to the nexplanon with scalpel. Hemostats were used to withdraw the nexplanon. A small bandage was applied over a steri strip  No complications.Patient given follow up instructions should she experience redness, swelling at sight or fever in the next 24 hours. Patient was reminded this totally removes her nexplanon contraceptive devise.     Assessment & Plan:   1. Nexplanon removal Patient tolerated procedure well without complication. Please see procedure note above.    2. Encounter for initial prescription of injectable contraceptive Counseled that DepoProvera can cause unpredictable menstrual bleeding as well. Upreg negative. Recommended back up method of contraception for 2 weeks to be extra pre-cautious.  - POCT urine pregnancy   Marcy Sirenatherine Chace Bisch, D.O. 05/21/2017, 4:04 PM PGY-3, [facility]

## 2017-05-21 NOTE — Addendum Note (Signed)
Addended by: Georges LynchSAUNDERS, SHARON T on: 05/21/2017 05:11 PM   Modules accepted: Orders

## 2017-05-23 ENCOUNTER — Ambulatory Visit: Payer: Self-pay

## 2017-06-11 ENCOUNTER — Ambulatory Visit (INDEPENDENT_AMBULATORY_CARE_PROVIDER_SITE_OTHER): Payer: Self-pay | Admitting: Family Medicine

## 2017-06-11 ENCOUNTER — Other Ambulatory Visit (HOSPITAL_COMMUNITY)
Admission: RE | Admit: 2017-06-11 | Discharge: 2017-06-11 | Disposition: A | Discharge status: Home / Self Care | Payer: Self-pay | Source: Ambulatory Visit | Attending: Family Medicine | Admitting: Family Medicine

## 2017-06-11 ENCOUNTER — Encounter: Payer: Self-pay | Admitting: Family Medicine

## 2017-06-11 VITALS — BP 112/74 | Temp 98.6°F | Ht 67.0 in | Wt 161.0 lb

## 2017-06-11 DIAGNOSIS — Z720 Tobacco use: Secondary | ICD-10-CM

## 2017-06-11 DIAGNOSIS — N898 Other specified noninflammatory disorders of vagina: Secondary | ICD-10-CM

## 2017-06-11 DIAGNOSIS — Z124 Encounter for screening for malignant neoplasm of cervix: Secondary | ICD-10-CM | POA: Insufficient documentation

## 2017-06-11 DIAGNOSIS — Z113 Encounter for screening for infections with a predominantly sexual mode of transmission: Secondary | ICD-10-CM

## 2017-06-11 HISTORY — DX: Tobacco use: Z72.0

## 2017-06-11 NOTE — Progress Notes (Signed)
    Subjective:  Alicia Dunlap is a 26 y.o. female [facility] with [facility] chief complaint of vaginal [facility].   HPI: Leotis ShamesLauren is complaining of 2wks white vaginal discharge that she says has an odor that she can't describe.   She has no pain with sex or urination.  She has mild tenderness to palpation of her lower abdomen.   She has a new sexual partner as of about 2wks prior to symptoms occurring.   She has also recently removed ehr nexplanon complaining of long periods and got a depo shot.  She has not had bleeding in the 2wks since and is pleased with this method so far.   Patient had an normal pap 1213yr ago.  Objective:  Physical Exam: BP 112/74 (BP Location: Right Arm, Patient Position: Sitting, Cuff Size: Normal)   Temp 98.6 F (37 C) (Oral)   Ht 5\' 7"  (1.702 m)   Wt 73 kg (161 lb)   LMP  (Within Weeks) Comment: 2 weeks ago prior to nexplanon removal  SpO2 99%   BMI 25.22 kg/m   ZOX:WRUEAGen:alert and conversational, sitting comfortably CV: RRR with no murmurs appreciated Pulm: NWOB, CTAB with no crackles, wheezes, or rhonchi GI: Normal bowel sounds present. Soft,tender to palpation off lower abdomen midline , Nondistended. MSK: no edema, cyanosis, or clubbing noted Skin: warm, dry Neuro: grossly normal, moves all extremities Psych: Normal affect and thought content Pap/bimanual: significant frothy white discharge with odor, no gross visual abnormality to cervix.  Cervix was midline with no abnormalities felt to adnexa on exam.  No results found for this or any previous visit (from the past 72 hour(s)).   Assessment/Plan:  Vaginal discharge C/o white discharge with odor for 2wks.   Pap/GC/wet prep/HIV test  Tobacco abuse Discussed plan to quit.   Patient does not want medical assistance at this time   Marthenia RollingScott Lanessa Shill, DO FAMILY MEDICINE RESIDENT - PGY1 06/11/2017 4:42 PM

## 2017-06-11 NOTE — Patient Instructions (Signed)
Alicia Dunlap, thank you for coming to see us today.   We talked about your concern for vaginal discharge and a desire to get some screening done in case you need treatment.   We did a pap, a test for gonnorhea/chlamydia, and HIV blood test and a wet prep.  Your results should be coming back within the next week.  We also talked about your desire to quit smoking.   You didn't want any medications to help you yet but we want you remember that we may have some options for you in the future if you change your mind.

## 2017-06-11 NOTE — Assessment & Plan Note (Addendum)
C/o white discharge with odor for 2wks.   Pap/GC/wet prep/HIV test

## 2017-06-11 NOTE — Assessment & Plan Note (Signed)
Discussed plan to quit.   Patient does not want medical assistance at this time

## 2017-06-12 ENCOUNTER — Other Ambulatory Visit: Payer: Self-pay | Admitting: Family Medicine

## 2017-06-12 DIAGNOSIS — N760 Acute vaginitis: Principal | ICD-10-CM

## 2017-06-12 DIAGNOSIS — B9689 Other specified bacterial agents as the cause of diseases classified elsewhere: Secondary | ICD-10-CM

## 2017-06-12 LAB — CERVICOVAGINAL ANCILLARY ONLY
CHLAMYDIA, DNA PROBE: NEGATIVE
Neisseria Gonorrhea: NEGATIVE

## 2017-06-12 LAB — POCT WET PREP (WET MOUNT)
CLUE CELLS WET PREP WHIFF POC: POSITIVE
Trichomonas Wet Prep HPF POC: ABSENT

## 2017-06-12 LAB — HIV ANTIBODY (ROUTINE TESTING W REFLEX): HIV Screen 4th Generation wRfx: NONREACTIVE

## 2017-06-12 MED ORDER — METRONIDAZOLE 500 MG PO TABS: 500.0000 mg | ORAL_TABLET | Freq: Two times a day (BID) | ORAL | 0 refills | Status: AC

## 2017-06-12 MED ORDER — METRONIDAZOLE 500 MG PO TABS: 500.0000 mg | ORAL_TABLET | Freq: Three times a day (TID) | ORAL | 0 refills | Status: DC

## 2017-06-12 NOTE — Addendum Note (Signed)
Addended by: Telford NabBLAND, Breyah Akhter R on: 06/12/2017 04:27 PM   Modules accepted: Orders

## 2017-06-12 NOTE — Progress Notes (Signed)
Patient called and informed of HIV/gonorrhea/chlamydia results being non-reactive/neg. Pap smear and wet prep still pending

## 2017-06-13 ENCOUNTER — Encounter: Payer: Self-pay | Admitting: Family Medicine

## 2017-06-13 LAB — CYTOLOGY - PAP: DIAGNOSIS: NEGATIVE

## 2017-08-20 ENCOUNTER — Ambulatory Visit: Payer: Self-pay

## 2017-11-01 ENCOUNTER — Ambulatory Visit: Payer: Self-pay | Admitting: Internal Medicine

## 2018-01-01 ENCOUNTER — Ambulatory Visit: Payer: Self-pay | Admitting: Student

## 2018-02-01 ENCOUNTER — Emergency Department (HOSPITAL_COMMUNITY)
Admission: EM | Admit: 2018-02-01 | Discharge: 2018-02-01 | Disposition: A | Discharge status: Home / Self Care | Payer: Self-pay | Attending: Emergency Medicine | Admitting: Emergency Medicine

## 2018-02-01 ENCOUNTER — Other Ambulatory Visit: Payer: Self-pay

## 2018-02-01 ENCOUNTER — Encounter (HOSPITAL_COMMUNITY): Payer: Self-pay

## 2018-02-01 DIAGNOSIS — Z79899 Other long term (current) drug therapy: Secondary | ICD-10-CM | POA: Insufficient documentation

## 2018-02-01 DIAGNOSIS — R112 Nausea with vomiting, unspecified: Secondary | ICD-10-CM | POA: Insufficient documentation

## 2018-02-01 DIAGNOSIS — F17210 Nicotine dependence, cigarettes, uncomplicated: Secondary | ICD-10-CM | POA: Insufficient documentation

## 2018-02-01 DIAGNOSIS — R197 Diarrhea, unspecified: Secondary | ICD-10-CM | POA: Insufficient documentation

## 2018-02-01 LAB — COMPREHENSIVE METABOLIC PANEL
ALBUMIN: 4.1 g/dL (ref 3.5–5.0)
ALT: 17 U/L (ref 14–54)
AST: 18 U/L (ref 15–41)
Alkaline Phosphatase: 46 U/L (ref 38–126)
Anion gap: 5 (ref 5–15)
BUN: 8 mg/dL (ref 6–20)
CHLORIDE: 107 mmol/L (ref 101–111)
CO2: 27 mmol/L (ref 22–32)
CREATININE: 0.61 mg/dL (ref 0.44–1.00)
Calcium: 8.9 mg/dL (ref 8.9–10.3)
GFR calc Af Amer: >60 mL/min (ref >60)
GFR calc non Af Amer: >60 mL/min (ref >60)
GLUCOSE: 93 mg/dL (ref 65–99)
Potassium: 4 mmol/L (ref 3.5–5.1)
SODIUM: 139 mmol/L (ref 135–145)
Total Bilirubin: 0.5 mg/dL (ref 0.3–1.2)
Total Protein: 7.4 g/dL (ref 6.5–8.1)

## 2018-02-01 LAB — URINALYSIS, ROUTINE W REFLEX MICROSCOPIC
BILIRUBIN URINE: NEGATIVE
GLUCOSE, UA: NEGATIVE mg/dL
HGB URINE DIPSTICK: NEGATIVE
Ketones, ur: NEGATIVE mg/dL
Leukocytes, UA: NEGATIVE
Nitrite: NEGATIVE
PROTEIN: NEGATIVE mg/dL
Specific Gravity, Urine: 1.014 (ref 1.005–1.030)
pH: 9 — ABNORMAL HIGH (ref 5.0–8.0)

## 2018-02-01 LAB — I-STAT BETA HCG BLOOD, ED (MC, WL, AP ONLY): I-stat hCG, quantitative: <5 m[IU]/mL (ref <5)

## 2018-02-01 LAB — LIPASE, BLOOD: Lipase: 23 U/L (ref 11–51)

## 2018-02-01 LAB — CBC
HCT: 39.2 % (ref 36.0–46.0)
Hemoglobin: 13.4 g/dL (ref 12.0–15.0)
MCH: 28.7 pg (ref 26.0–34.0)
MCHC: 34.2 g/dL (ref 30.0–36.0)
MCV: 83.9 fL (ref 78.0–100.0)
Platelets: 275 10*3/uL (ref 150–400)
RBC: 4.67 MIL/uL (ref 3.87–5.11)
RDW: 12.7 % (ref 11.5–15.5)
WBC: 7.2 10*3/uL (ref 4.0–10.5)

## 2018-02-01 MED ORDER — ONDANSETRON HCL 4 MG/2ML IJ SOLN
4.0000 mg | Freq: Once | INTRAMUSCULAR | Status: AC
  Administered 2018-02-01: 4 mg via INTRAVENOUS
  Filled 2018-02-01: qty 2

## 2018-02-01 MED ORDER — ONDANSETRON HCL 4 MG PO TABS: 4.0000 mg | ORAL_TABLET | Freq: Four times a day (QID) | ORAL | 0 refills | Status: DC

## 2018-02-01 MED ORDER — KETOROLAC TROMETHAMINE 30 MG/ML IJ SOLN
30.0000 mg | Freq: Once | INTRAMUSCULAR | Status: AC
  Administered 2018-02-01: 30 mg via INTRAVENOUS
  Filled 2018-02-01: qty 1

## 2018-02-01 MED ORDER — SODIUM CHLORIDE 0.9 % IV BOLUS (SEPSIS)
1000.0000 mL | Freq: Once | INTRAVENOUS | Status: AC
  Administered 2018-02-01: 1000 mL via INTRAVENOUS

## 2018-02-01 NOTE — ED Notes (Signed)
ED Provider at bedside. 

## 2018-02-01 NOTE — ED Notes (Signed)
Per Dr. Charm BargesButler, give toradol if I-stat beta hcg is negative. Patient made aware.

## 2018-02-01 NOTE — Discharge Instructions (Addendum)
Your evaluated in the emergency department for acute onset of nausea vomiting and diarrhea.  Your lab work did not show an obvious cause of your symptoms.  We are prescribing you some Zofran to help with nausea.  We recommend a clear liquid diet advance as tolerated.  Return to the emergency department if any worsening symptoms.

## 2018-02-01 NOTE — ED Triage Notes (Addendum)
Patient c/o mid and lower abdominal pain, N/v/D since last night. Patient states pain radiates into the lower back. Patient denies any dysuria or frequency.

## 2018-02-01 NOTE — ED Provider Notes (Signed)
[facility]-EMERGENCY DEPT Provider Note   CSN: [id number] Arrival date & time: 02/01/18  0755     History   Chief Complaint Chief Complaint  Patient presents with  . Emesis  . Abdominal Pain  . Diarrhea    HPI Alicia Dunlap is a 27 y.o. female.  She is complaining of acute onset of nausea vomiting diarrhea multiple episodes since 3 AM this morning that woke her up.  She had a few more hours of sleep and then it recurred again and she felt she was unable to go to work today.  She took her daughter to school there is no sick contacts at home.  There is no blood from either end.  No fevers or chills.  She is tried nothing for it.  Last vomiting was within an hour.  She states her whole family was sick about a month ago with the flu.  Last menstrual period is been inconsistent as she is just got a depo shot about 3 months ago.  The history is provided by the patient.  Emesis   This is a new problem. The current episode started 3 to 5 hours ago. The problem occurs 5 to 10 times per day. The problem has not changed since onset.The emesis has an appearance of stomach contents. There has been no fever. Associated symptoms include abdominal pain and diarrhea. Pertinent negatives include no arthralgias, no chills, no cough, no fever, no myalgias and no URI.  Abdominal Pain   This is a new problem. The problem has not changed since onset.The pain is located in the generalized abdominal region. The quality of the pain is shooting. The pain is moderate. Associated symptoms include diarrhea, nausea and vomiting. Pertinent negatives include fever, dysuria, frequency, hematuria, arthralgias and myalgias. Nothing aggravates the symptoms. Nothing relieves the symptoms.  Diarrhea   Associated symptoms include abdominal pain and vomiting. Pertinent negatives include no chills, no arthralgias, no myalgias, no URI and no cough.    Past Medical History:  Diagnosis Date  . Bacterial  vaginosis     Patient Active Problem List   Diagnosis Date Noted  . Tobacco abuse 06/11/2017  . Nausea and vomiting 06/03/2014  . Insertion of Nexplanon 06/03/2014  . Unspecified contraceptive management 06/03/2014  . Vaginal discharge 04/15/2014  . High risk sexual behavior 02/12/2013  . Drug abuse, marijuana 09/06/2012  . Seasonal allergies 02/22/2012  . 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 Encounter for Implanon removal 07/06/2011  . DENTAL CARIES 08/11/2010    Past Surgical History:  Procedure Laterality Date  . NO PAST SURGERIES      OB History    Gravida  1   Para  1   Term  1   Preterm      AB      Living  1     SAB      TAB      Ectopic      Multiple      Live Births               Home Medications    Prior to Admission medications   Medication Sig Start Date End Date Taking? Authorizing Provider  Biotin w/ Vitamins C & E (HAIR SKIN & NAILS GUMMIES PO) Take 2 each by mouth daily.    [provider]  clindamycin (CLEOCIN) 150 MG capsule Take 1 capsule (150 mg total) by mouth 3 (three) times daily. 01/24/17   Dorena BodoKennard, Lawrence, NP  etonogestrel (  IMPLANON) 68 MG IMPL implant 1 each by Subdermal route once.    [provider]  HYDROcodone-acetaminophen (NORCO/VICODIN) 5-325 MG tablet Take 1-2 tablets by mouth every 6 (six) hours as needed. 01/24/17   Dorena Bodo, NP    Family History No family history on file.  Social History Social History   Tobacco Use  . Smoking status: Current Every Day Smoker    Types: Cigarettes  . Smokeless tobacco: Never Used  Substance Use Topics  . Alcohol use: No  . Drug use: No     Allergies   Amoxicillin   Review of Systems Review of Systems  Constitutional: Negative for chills and fever.  HENT: Negative for ear pain and sore throat.   Eyes: Negative for pain and visual disturbance.  Respiratory: Negative for cough and shortness of breath.   Cardiovascular: Negative for chest  pain and palpitations.  Gastrointestinal: Positive for abdominal pain, diarrhea, nausea and vomiting.  Genitourinary: Negative for dysuria, frequency and hematuria.  Musculoskeletal: Negative for arthralgias, back pain and myalgias.  Skin: Negative for color change and rash.  Neurological: Negative for seizures and syncope.  All other systems reviewed and are negative.    Physical Exam Updated Vital Signs BP 120/75 (BP Location: Left Arm)   Pulse 88   Temp 98.5 F (36.9 C) (Oral)   Resp 16   Ht 5\' 6"  (1.676 m)   Wt 77.1 kg (170 lb)   SpO2 96%   BMI 27.44 kg/m   Physical Exam  Constitutional: She appears well-developed and well-nourished. No distress.  HENT:  Head: Normocephalic and atraumatic.  Eyes: Conjunctivae are normal.  Neck: Neck supple.  Cardiovascular: Normal rate and regular rhythm.  No murmur heard. Pulmonary/Chest: Effort normal and breath sounds normal. No respiratory distress.  Abdominal: Soft. There is no tenderness. There is no rigidity and no guarding.  Musculoskeletal: She exhibits no edema or deformity.  Neurological: She is alert.  Skin: Skin is warm and dry. Capillary refill takes less than 2 seconds.  Psychiatric: She has a normal mood and affect.  Nursing note and vitals reviewed.    ED Treatments / Results  Labs (all labs ordered are listed, but only abnormal results are displayed) Labs Reviewed  URINALYSIS, ROUTINE W REFLEX MICROSCOPIC - Abnormal; Notable for the following components:      Result Value   pH 9.0 (*)    All other components within normal limits  LIPASE, BLOOD  COMPREHENSIVE METABOLIC PANEL  CBC  I-STAT BETA HCG BLOOD, ED ([facility], [facility], [facility] ONLY)    EKG  EKG Interpretation None       Radiology No results found.  Procedures Procedures (including critical care time)  Medications Ordered in ED Medications - No data to display   Initial Impression / Assessment and Plan / ED Course  I have reviewed the triage vital  signs and the nursing notes.  Pertinent labs & imaging results that were available during my care of the patient were reviewed by me and considered in my medical decision making (see chart for details).  Clinical Course as of Feb 02 1912  Fri Feb 01, 2018  5835 27 year old female with acute onset of nausea vomiting diarrhea this morning.  No obvious exposures and a benign exam with normal vitals.  She is getting some screening labs.  Differential includes gastroenteritis, obstruction, acute abdominal process such as cholecystitis.   [MB]  0935 Reevaluated, patient feeling better.  Reviewed her results with her, she is wanting to  try some p.o.  Likely discharge with a note for work and some Zofran.   [MB]    Clinical Course User Index [MB] Terrilee Files, MD      Final Clinical Impressions(s) / ED Diagnoses   Final diagnoses:  Nausea vomiting and diarrhea    ED Discharge Orders        Ordered    ondansetron (ZOFRAN) 4 MG tablet  Every 6 hours     02/01/18 1126       Terrilee Files, MD 02/01/18 [phone]

## 2018-02-01 NOTE — ED Notes (Addendum)
Patient tolerating water and ginger ale well. Denies nausea and upset stomach.

## 2018-06-22 ENCOUNTER — Emergency Department (HOSPITAL_COMMUNITY)
Admission: EM | Admit: 2018-06-22 | Discharge: 2018-06-22 | Disposition: A | Discharge status: Home / Self Care | Payer: Self-pay | Attending: Emergency Medicine | Admitting: Emergency Medicine

## 2018-06-22 ENCOUNTER — Encounter (HOSPITAL_COMMUNITY): Payer: Self-pay | Admitting: Emergency Medicine

## 2018-06-22 ENCOUNTER — Other Ambulatory Visit: Payer: Self-pay

## 2018-06-22 DIAGNOSIS — K2920 Alcoholic gastritis without bleeding: Secondary | ICD-10-CM | POA: Insufficient documentation

## 2018-06-22 DIAGNOSIS — Y908 Blood alcohol level of 240 mg/100 ml or more: Secondary | ICD-10-CM | POA: Insufficient documentation

## 2018-06-22 DIAGNOSIS — F10120 Alcohol abuse with intoxication, uncomplicated: Secondary | ICD-10-CM | POA: Insufficient documentation

## 2018-06-22 DIAGNOSIS — Z87891 Personal history of nicotine dependence: Secondary | ICD-10-CM | POA: Insufficient documentation

## 2018-06-22 LAB — CBC WITH DIFFERENTIAL/PLATELET
Basophils Absolute: 0 10*3/uL (ref 0.0–0.1)
Basophils Relative: 1 %
EOS ABS: 0.1 10*3/uL (ref 0.0–0.7)
EOS PCT: 1 %
HCT: 38.5 % (ref 36.0–46.0)
Hemoglobin: 13.3 g/dL (ref 12.0–15.0)
LYMPHS ABS: 1.6 10*3/uL (ref 0.7–4.0)
Lymphocytes Relative: 20 %
MCH: 28.1 pg (ref 26.0–34.0)
MCHC: 34.5 g/dL (ref 30.0–36.0)
MCV: 81.2 fL (ref 78.0–100.0)
MONO ABS: 0.6 10*3/uL (ref 0.1–1.0)
Monocytes Relative: 8 %
Neutro Abs: 5.8 10*3/uL (ref 1.7–7.7)
Neutrophils Relative %: 70 %
PLATELETS: 351 10*3/uL (ref 150–400)
RBC: 4.74 MIL/uL (ref 3.87–5.11)
RDW: 12.2 % (ref 11.5–15.5)
WBC: 8.1 10*3/uL (ref 4.0–10.5)

## 2018-06-22 LAB — COMPREHENSIVE METABOLIC PANEL
ALT: 34 U/L (ref 0–44)
ANION GAP: 9 (ref 5–15)
AST: 29 U/L (ref 15–41)
Albumin: 4.5 g/dL (ref 3.5–5.0)
Alkaline Phosphatase: 67 U/L (ref 38–126)
BUN: 10 mg/dL (ref 6–20)
CHLORIDE: 108 mmol/L (ref 98–111)
CO2: 25 mmol/L (ref 22–32)
Calcium: 9.4 mg/dL (ref 8.9–10.3)
Creatinine, Ser: 0.6 mg/dL (ref 0.44–1.00)
Glucose, Bld: 90 mg/dL (ref 70–99)
POTASSIUM: 4 mmol/L (ref 3.5–5.1)
SODIUM: 142 mmol/L (ref 135–145)
Total Bilirubin: 0.6 mg/dL (ref 0.3–1.2)
Total Protein: 8 g/dL (ref 6.5–8.1)

## 2018-06-22 LAB — I-STAT BETA HCG BLOOD, ED (MC, WL, AP ONLY)

## 2018-06-22 LAB — LIPASE, BLOOD: LIPASE: 23 U/L (ref 11–51)

## 2018-06-22 LAB — ETHANOL: ALCOHOL ETHYL (B): 14 mg/dL — AB (ref <10)

## 2018-06-22 MED ORDER — SUCRALFATE 1 G PO TABS: 1.0000 g | ORAL_TABLET | Freq: Four times a day (QID) | ORAL | 0 refills | Status: DC

## 2018-06-22 MED ORDER — PANTOPRAZOLE SODIUM 40 MG IV SOLR
40.0000 mg | Freq: Once | INTRAVENOUS | Status: AC
  Administered 2018-06-22: 40 mg via INTRAVENOUS
  Filled 2018-06-22: qty 40

## 2018-06-22 MED ORDER — FAMOTIDINE 20 MG PO TABS: 20.0000 mg | ORAL_TABLET | Freq: Two times a day (BID) | ORAL | 0 refills | Status: DC

## 2018-06-22 MED ORDER — SODIUM CHLORIDE 0.9 % IV SOLN: INTRAVENOUS | Status: DC

## 2018-06-22 MED ORDER — LORAZEPAM 2 MG/ML IJ SOLN
0.5000 mg | Freq: Once | INTRAMUSCULAR | Status: AC
  Administered 2018-06-22: 0.5 mg via INTRAVENOUS
  Filled 2018-06-22: qty 1

## 2018-06-22 MED ORDER — SODIUM CHLORIDE 0.9 % IV BOLUS
2000.0000 mL | Freq: Once | INTRAVENOUS | Status: AC
  Administered 2018-06-22: 2000 mL via INTRAVENOUS

## 2018-06-22 MED ORDER — METOCLOPRAMIDE HCL 5 MG/ML IJ SOLN
5.0000 mg | Freq: Once | INTRAMUSCULAR | Status: AC
  Administered 2018-06-22: 5 mg via INTRAVENOUS
  Filled 2018-06-22: qty 2

## 2018-06-22 NOTE — ED Provider Notes (Signed)
[facility]-EMERGENCY DEPT Provider Note   CSN: [id number] Arrival date & time: 06/22/18  1009     History   Chief Complaint Chief Complaint  Patient presents with  . Abdominal Pain  . Alcohol Intoxication    HPI RIVER MCKERCHER is a 27 y.o. female.  27 year old female presents with abdominal pain after drinking alcohol yesterday evening.  Has had nonbilious emesis but got worse when she was eating.  No fever or chills.  Abdominal pain is diffuse and nonlocalizing.  Denies any vaginal bleeding or discharge.  No urinary symptoms.  No treatment used prior to arrival nothing makes her symptoms better.     Past Medical History:  Diagnosis Date  . Bacterial vaginosis     Patient Active Problem List   Diagnosis Date Noted  . Tobacco abuse 06/11/2017  . Nausea and vomiting 06/03/2014  . Insertion of Nexplanon 06/03/2014  . Unspecified contraceptive management 06/03/2014  . Vaginal discharge 04/15/2014  . High risk sexual behavior 02/12/2013  . Drug abuse, marijuana 09/06/2012  . Seasonal allergies 02/22/2012  . 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 Encounter for Implanon removal 07/06/2011  . DENTAL CARIES 08/11/2010    Past Surgical History:  Procedure Laterality Date  . NO PAST SURGERIES       OB History    Gravida  1   Para  1   Term  1   Preterm      AB      Living  1     SAB      TAB      Ectopic      Multiple      Live Births               Home Medications    Prior to Admission medications   Medication Sig Start Date End Date Taking? Authorizing Provider  ondansetron (ZOFRAN) 4 MG tablet Take 1 tablet (4 mg total) by mouth every 6 (six) hours. 02/01/18   Terrilee Files, MD    Family History History reviewed. No pertinent family history.  Social History Social History   Tobacco Use  . Smoking status: Former Smoker    Types: Cigarettes  . Smokeless tobacco: Never Used  Substance Use Topics  . Alcohol  use: Yes  . Drug use: No     Allergies   Amoxicillin   Review of Systems Review of Systems  All other systems reviewed and are negative.    Physical Exam Updated Vital Signs BP (!) 129/92 (BP Location: Right Arm)   Pulse 90   Temp 98.3 F (36.8 C) (Oral)   Resp 18   LMP 06/09/2018 (Approximate)   SpO2 100%   Physical Exam  Constitutional: She is oriented to person, place, and time. She appears well-developed and well-nourished.  Non-toxic appearance. No distress.  HENT:  Head: Normocephalic and atraumatic.  Eyes: Pupils are equal, round, and reactive to light. Conjunctivae, EOM and lids are normal.  Neck: Normal range of motion. Neck supple. No tracheal deviation present. No thyroid mass present.  Cardiovascular: Normal rate, regular rhythm and normal heart sounds. Exam reveals no gallop.  No murmur heard. Pulmonary/Chest: Effort normal and breath sounds normal. No stridor. No respiratory distress. She has no decreased breath sounds. She has no wheezes. She has no rhonchi. She has no rales.  Abdominal: Soft. Normal appearance and bowel sounds are normal. She exhibits no distension. There is no tenderness. There is no rigidity, no rebound, no guarding  and no CVA tenderness.  Musculoskeletal: Normal range of motion. She exhibits no edema or tenderness.  Neurological: She is alert and oriented to person, place, and time. She has normal strength. No cranial nerve deficit or sensory deficit. GCS eye subscore is 4. GCS verbal subscore is 5. GCS motor subscore is 6.  Skin: Skin is warm and dry. No abrasion and no rash noted.  Psychiatric: She has a normal mood and affect. Her speech is normal and behavior is normal.  Nursing note and vitals reviewed.    ED Treatments / Results  Labs (all labs ordered are listed, but only abnormal results are displayed) Labs Reviewed  CBC WITH DIFFERENTIAL/PLATELET  COMPREHENSIVE METABOLIC PANEL  ETHANOL  LIPASE, BLOOD     EKG None  Radiology No results found.  Procedures Procedures (including critical care time)  Medications Ordered in ED Medications  sodium chloride 0.9 % bolus 2,000 mL (has no administration in time range)  0.9 %  sodium chloride infusion (has no administration in time range)  LORazepam (ATIVAN) injection 0.5 mg (has no administration in time range)  metoCLOPramide (REGLAN) injection 5 mg (has no administration in time range)  pantoprazole (PROTONIX) injection 40 mg (has no administration in time range)     Initial Impression / Assessment and Plan / ED Course  I have reviewed the triage vital signs and the nursing notes.  Pertinent labs & imaging results that were available during my care of the patient were reviewed by me and considered in my medical decision making (see chart for details).     Patient given IV fluids medication for suspected alcoholic gastritis.  Her exam her belly remains benign.  Will discharge home  Final Clinical Impressions(s) / ED Diagnoses   Final diagnoses:  None    ED Discharge Orders    None       Lorre NickAllen, Joely Losier, MD 06/22/18 1153

## 2018-06-22 NOTE — ED Triage Notes (Addendum)
Pt states she is having abdominal pain and nausea / vomiting after drinking 4 shots and several beers from 8pm-12am yesterday.

## 2018-09-18 ENCOUNTER — Inpatient Hospital Stay (HOSPITAL_COMMUNITY)
Admission: AD | Admit: 2018-09-18 | Discharge: 2018-09-18 | Disposition: A | Discharge status: Home / Self Care | Payer: Self-pay | Source: Ambulatory Visit | Attending: Obstetrics and Gynecology | Admitting: Obstetrics and Gynecology

## 2018-09-18 ENCOUNTER — Encounter (HOSPITAL_COMMUNITY): Payer: Self-pay | Admitting: *Deleted

## 2018-09-18 ENCOUNTER — Inpatient Hospital Stay (HOSPITAL_COMMUNITY): Payer: Self-pay

## 2018-09-18 DIAGNOSIS — Z880 Allergy status to penicillin: Secondary | ICD-10-CM | POA: Insufficient documentation

## 2018-09-18 DIAGNOSIS — R102 Pelvic and perineal pain: Secondary | ICD-10-CM | POA: Insufficient documentation

## 2018-09-18 DIAGNOSIS — Z87891 Personal history of nicotine dependence: Secondary | ICD-10-CM | POA: Insufficient documentation

## 2018-09-18 DIAGNOSIS — R1032 Left lower quadrant pain: Secondary | ICD-10-CM | POA: Insufficient documentation

## 2018-09-18 LAB — WET PREP, GENITAL
CLUE CELLS WET PREP: NONE SEEN
SPERM: NONE SEEN
TRICH WET PREP: NONE SEEN
Yeast Wet Prep HPF POC: NONE SEEN

## 2018-09-18 LAB — POCT PREGNANCY, URINE: Preg Test, Ur: NEGATIVE

## 2018-09-18 LAB — URINALYSIS, ROUTINE W REFLEX MICROSCOPIC
Bilirubin Urine: NEGATIVE
Glucose, UA: NEGATIVE mg/dL
HGB URINE DIPSTICK: NEGATIVE
Ketones, ur: NEGATIVE mg/dL
LEUKOCYTES UA: NEGATIVE
Nitrite: NEGATIVE
PH: 5 (ref 5.0–8.0)
Protein, ur: NEGATIVE mg/dL
SPECIFIC GRAVITY, URINE: 1.016 (ref 1.005–1.030)

## 2018-09-18 MED ORDER — NAPROXEN 500 MG PO TABS: 500.0000 mg | ORAL_TABLET | Freq: Two times a day (BID) | ORAL | 0 refills | Status: DC

## 2018-09-18 MED ORDER — KETOROLAC TROMETHAMINE 60 MG/2ML IM SOLN
60.0000 mg | Freq: Once | INTRAMUSCULAR | Status: AC
  Administered 2018-09-18: 60 mg via INTRAMUSCULAR
  Filled 2018-09-18: qty 2

## 2018-09-18 NOTE — Discharge Instructions (Signed)
°  [facility]  Address: [address] [place], [place] [address]  Phone: [phone]   Pelvic Pain, Female Pelvic pain is pain in your lower belly (abdomen), below your belly button and between your hips. The pain may start suddenly (acute), keep coming back (recurring), or last a long time (chronic). Pelvic pain that lasts longer than six months is considered chronic. There are many causes of pelvic pain. Sometimes the cause of your pelvic pain is not known. Follow these instructions at home:  Take over-the-counter and prescription medicines only as told by your doctor.  Rest as told by your doctor.  Do not have sex it if hurts.  Keep a journal of your pelvic pain. Write down: ? When the pain started. ? Where the pain is located. ? What seems to make the pain better or worse, such as food or your menstrual cycle. ? Any symptoms you have along with the pain.  Keep all follow-up visits as told by your doctor. This is important. Contact a doctor if:  Medicine does not help your pain.  Your pain comes back.  You have new symptoms.  You have unusual vaginal discharge or bleeding.  You have a fever or chills.  You are having a hard time pooping (constipation).  You have blood in your pee (urine) or poop (stool).  Your pee smells bad.  You feel weak or lightheaded. Get help right away if:  You have sudden pain that is very bad.  Your pain continues to get worse.  You have very bad pain and also have any of the following symptoms: ? A fever. ? Feeling stick to your stomach (nausea). ? Throwing up (vomiting). ? Being very sweaty.  You pass out (lose consciousness). This information is not intended to replace advice given to you by your health care provider. Make sure you discuss any questions you have with your health care provider. Document Released: 04/17/2008 Document Revised: 11/24/2015 Document Reviewed: 08/20/2015 Elsevier Interactive  Patient Education  [employer].

## 2018-09-18 NOTE — MAU Note (Signed)
Pt reports pain on left side and "left ovary" for the past 4-5 days. Denies vaginal bleeding. States they have been trying to get pregnant and they "conceived last week"

## 2018-09-18 NOTE — MAU Provider Note (Signed)
History     CSN: [id number]  Arrival date and time: 09/18/18 4098   First Provider Initiated Contact with Patient 09/18/18 1138      Chief Complaint  Patient presents with  . Abdominal Pain  . Possible Pregnancy   Alicia Dunlap is a 27 y.o. G1P1001 who presents today with LLQ pain x 4 days. She reports that is trying to conceive and thought she was ovulating last week, and had intercourse at that time. She reports that she had a negative pregancy test at home.   Abdominal Pain  This is a new problem. The current episode started in the past 7 days. The problem occurs intermittently. The problem has been unchanged. The pain is located in the LLQ. The pain is at a severity of 6/10. The quality of the pain is cramping. The abdominal pain radiates to the pelvis. Pertinent negatives include no dysuria, fever, nausea or vomiting. Nothing aggravates the pain. The pain is relieved by nothing. She has tried nothing for the symptoms.    OB History    Gravida  1   Para  1   Term  1   Preterm      AB      Living  1     SAB      TAB      Ectopic      Multiple      Live Births              Past Medical History:  Diagnosis Date  . Bacterial vaginosis     Past Surgical History:  Procedure Laterality Date  . NO PAST SURGERIES      History reviewed. No pertinent family history.  Social History   Tobacco Use  . Smoking status: Former Smoker    Types: Cigarettes  . Smokeless tobacco: Never Used  Substance Use Topics  . Alcohol use: Not Currently  . Drug use: No    Allergies:  Allergies  Allergen Reactions  . Amoxicillin Rash    Has patient had a PCN reaction causing immediate rash, facial/tongue/throat swelling, SOB or lightheadedness with hypotension: no Has patient had a PCN reaction causing severe rash involving mucus membranes or skin necrosis: no Has patient had a PCN reaction that required hospitalization no Has patient had a PCN reaction occurring  within the last 10 years: yes If all of the above answers are "NO", then may proceed with Cephalosporin use.     Facility-Administered Medications Prior to Admission  Medication Dose Route Frequency Provider Last Rate Last Dose  . Etonogestrel IMPL 1 each  1 each Subcutaneous Once Caviness, Miachel Roux, MD       Medications Prior to Admission  Medication Sig Dispense Refill Last Dose  . famotidine (PEPCID) 20 MG tablet Take 1 tablet (20 mg total) by mouth 2 (two) times daily. 30 tablet 0   . ondansetron (ZOFRAN) 4 MG tablet Take 1 tablet (4 mg total) by mouth every 6 (six) hours. (Patient not taking: Reported on 06/22/2018) 12 tablet 0 Not Taking at Unknown time  . sucralfate (CARAFATE) 1 g tablet Take 1 tablet (1 g total) by mouth 4 (four) times daily. 30 tablet 0     Review of Systems  Constitutional: Negative for chills and fever.  Gastrointestinal: Negative for nausea and vomiting.  Genitourinary: Negative for dysuria, vaginal bleeding and vaginal discharge.   Physical Exam   Blood pressure 130/84, pulse 79, temperature 98.3 F (36.8 C), resp. rate 16, height  5\' 6"  (1.676 m), weight 94.3 kg, last menstrual period 08/26/2018, SpO2 100 %.  Physical Exam  Nursing note and vitals reviewed. Constitutional: She is oriented to person, place, and time. She appears well-developed and well-nourished. No distress.  HENT:  Head: Normocephalic.  Cardiovascular: Normal rate.  Respiratory: Effort normal.  GI: Soft. There is no tenderness. There is no rebound.  Neurological: She is alert and oriented to person, place, and time.  Skin: Skin is warm and dry.  Psychiatric: She has a normal mood and affect.   Results for orders placed or performed during the hospital encounter of 09/18/18 (from the past 24 hour(s))  Urinalysis, Routine w reflex microscopic     Status: None   Collection Time: 09/18/18 10:20 AM  Result Value Ref Range   Color, Urine YELLOW YELLOW   APPearance CLEAR CLEAR    Specific Gravity, Urine 1.016 1.005 - 1.030   pH 5.0 5.0 - 8.0   Glucose, UA NEGATIVE NEGATIVE mg/dL   Hgb urine dipstick NEGATIVE NEGATIVE   Bilirubin Urine NEGATIVE NEGATIVE   Ketones, ur NEGATIVE NEGATIVE mg/dL   Protein, ur NEGATIVE NEGATIVE mg/dL   Nitrite NEGATIVE NEGATIVE   Leukocytes, UA NEGATIVE NEGATIVE  Pregnancy, urine POC     Status: None   Collection Time: 09/18/18 10:22 AM  Result Value Ref Range   Preg Test, Ur NEGATIVE NEGATIVE  Wet prep, genital     Status: Abnormal   Collection Time: 09/18/18 12:05 PM  Result Value Ref Range   Yeast Wet Prep HPF POC NONE SEEN NONE SEEN   Trich, Wet Prep NONE SEEN NONE SEEN   Clue Cells Wet Prep HPF POC NONE SEEN NONE SEEN   WBC, Wet Prep HPF POC FEW (A) NONE SEEN   Sperm NONE SEEN    US Pelvic Complete W Transvaginal And Torsion R/o  Result Date: 09/18/2018 CLINICAL DATA:  Left lower quadrant pain. EXAM: TRANSABDOMINAL AND TRANSVAGINAL ULTRASOUND OF PELVIS DOPPLER ULTRASOUND OF OVARIES TECHNIQUE: Both transabdominal and transvaginal ultrasound examinations of the pelvis were performed. Transabdominal technique was performed for global imaging of the pelvis including uterus, ovaries, adnexal regions, and pelvic cul-de-sac. It was necessary to proceed with endovaginal exam following the transabdominal exam to visualize the ovaries. Color and duplex Doppler ultrasound was utilized to evaluate blood flow to the ovaries. COMPARISON:  08/09/2013 FINDINGS: Uterus Measurements: 8.8 x 4.4 x 4.8 cm = volume: 98.5 mL. No fibroids or other mass visualized. Endometrium Thickness: 4 mm.  No focal abnormality visualized. Right ovary Measurements: 2.9 x 1.9 x 2.0 cm = volume: 6.12 mL. Normal appearance/no adnexal mass. Left ovary Measurements: 2.9 x 2.1 x 2.6 cm = volume: 8.78 mL. Normal appearance/no adnexal mass. Pulsed Doppler evaluation of both ovaries demonstrates normal low-resistance arterial and venous waveforms. Other findings Small volume of free  fluid noted within the pelvis. IMPRESSION: 1. Essentially normal exam.  No evidence for ovarian torsion. 2. Small volume of free fluid noted within the pelvis.  Nonspecific. Electronically Signed   By: Signa Kell M.D.   On: 09/18/2018 14:00    MAU Course  Procedures  MDM Patient has has toradol for pain. She reports that her pain has improved. No evidence of torsion seen on [place] today. Reassurance provided to the patient.   Assessment and Plan   1. Pelvic pain   2. Left lower quadrant pain    DC home Comfort measures reviewed  RX: Naproxen 500mg  BID PRN #30  Return to MAU as needed FU  with OB as planned  Follow-up Information    Department, [facility] Follow up.   Contact information: [address] [place] [address] [phone]            Thressa Sheller 09/18/2018, 12:29 PM

## 2018-09-19 LAB — GC/CHLAMYDIA PROBE AMP (~~LOC~~) NOT AT ARMC
Chlamydia: NEGATIVE
Neisseria Gonorrhea: NEGATIVE

## 2018-10-14 ENCOUNTER — Ambulatory Visit: Payer: Self-pay | Admitting: Family Medicine

## 2018-10-14 NOTE — Progress Notes (Deleted)
   Subjective:   Patient ID: Alicia Dunlap    DOB: 12/22/1990, 27 y.o. female   MRN: [id number]  CC: ***  HPI: Alicia Dunlap is a 27 y.o. female who presents to clinic today ***. Problems discussed today are as follows:  ROS: See HPI for pertinent ROS.  PMFSH: Pertinent past medical, surgical, family, and social history were reviewed and updated as appropriate. Smoking status reviewed.  Medications reviewed. Objective:   There were no vitals taken for this visit. Vitals and nursing note reviewed.  General: well nourished, well developed, in no acute distress with non-toxic appearance HEENT: NCAT, EOMI, PERRL, MMM, oropharynx clear Neck: supple, non-tender, normal ROM, no LAD  CV: RRR no MRG  Lungs: CTAB, normal effort  Abdomen: soft, NTND, no masses or organomegaly, +bs  Skin: warm, dry, no rashes or lesions, cap refill < 2 seconds Extremities: warm and well perfused, normal tone Neuro: alert, oriented x3, no focal deficits   Assessment & Plan:   No problem-specific Assessment & Plan notes found for this encounter.  No orders of the defined types were placed in this encounter.  No orders of the defined types were placed in this encounter.   Freddrick MarchYashika Cambryn Charters, MD [facility], PGY-3 10/14/2018 1:16 PM

## 2018-10-29 ENCOUNTER — Ambulatory Visit: Payer: Self-pay

## 2018-11-13 NOTE — L&D Delivery Note (Signed)
Delivery Note At 6:02 AM a viable and healthy female was delivered via Vaginal, Spontaneous (Presentation: LOA  ). APGAR:9,9 ,weight  Per infant chart Placenta status: delivered spontaneously intact .  Cord: 3-vessel    Anesthesia:  Epidural Episiotomy:  none Lacerations:  1 degree perineal Suture Repair: 3.0 vicryl Est. Blood Loss (mL):  150  Mom to postpartum.  Baby to Couplet care / Skin to Skin.  Nikki Dom Dionne Rossa 46/03/353, 6:56 AM

## 2018-11-15 ENCOUNTER — Ambulatory Visit: Payer: Self-pay | Admitting: Student in an Organized Health Care Education/Training Program

## 2018-11-28 ENCOUNTER — Ambulatory Visit (INDEPENDENT_AMBULATORY_CARE_PROVIDER_SITE_OTHER): Payer: Medicaid Other | Admitting: Student in an Organized Health Care Education/Training Program

## 2018-11-28 ENCOUNTER — Other Ambulatory Visit: Payer: Self-pay

## 2018-11-28 ENCOUNTER — Inpatient Hospital Stay (HOSPITAL_COMMUNITY)
Admission: AD | Admit: 2018-11-28 | Discharge: 2018-11-28 | Disposition: A | Discharge status: Home / Self Care | Payer: Self-pay | Attending: Obstetrics & Gynecology | Admitting: Obstetrics & Gynecology

## 2018-11-28 ENCOUNTER — Encounter: Payer: Self-pay | Admitting: Student in an Organized Health Care Education/Training Program

## 2018-11-28 VITALS — BP 120/64 | HR 106 | Temp 99.4°F | Ht 66.0 in | Wt 212.0 lb

## 2018-11-28 DIAGNOSIS — J029 Acute pharyngitis, unspecified: Secondary | ICD-10-CM

## 2018-11-28 DIAGNOSIS — O09211 Supervision of pregnancy with history of pre-term labor, first trimester: Secondary | ICD-10-CM

## 2018-11-28 DIAGNOSIS — Z3200 Encounter for pregnancy test, result unknown: Secondary | ICD-10-CM

## 2018-11-28 DIAGNOSIS — Z3201 Encounter for pregnancy test, result positive: Secondary | ICD-10-CM

## 2018-11-28 LAB — POCT URINALYSIS DIP (MANUAL ENTRY)
Bilirubin, UA: NEGATIVE
Glucose, UA: NEGATIVE mg/dL
Ketones, POC UA: NEGATIVE mg/dL
Leukocytes, UA: NEGATIVE
Nitrite, UA: NEGATIVE
PROTEIN UA: NEGATIVE mg/dL
RBC UA: NEGATIVE
SPEC GRAV UA: 1.02 (ref 1.010–1.025)
UROBILINOGEN UA: 0.2 U/dL
pH, UA: 6 (ref 5.0–8.0)

## 2018-11-28 LAB — POCT URINE PREGNANCY: PREG TEST UR: POSITIVE — AB

## 2018-11-28 NOTE — Progress Notes (Signed)
   CC: Positive pregnancy test  HPI: Alicia Dunlap is a 28 y.o. female with PMH: Patient Active Problem List   Diagnosis Date Noted  . Positive pregnancy test 11/28/2018  . Sore throat 11/28/2018  . Tobacco abuse 06/11/2017  . High risk sexual behavior 02/12/2013  . Drug abuse, marijuana 09/06/2012  . 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 DENTAL CARIES 08/11/2010   Positive pregnancy test Patient presents today for confirmation of a positive home pregnancy test.  She reports that this is a desired pregnancy.  She reports that her last menstrual cycle Started on December 18.  The symptoms include fatigue and nausea.  She has not had any vomiting.  Has been staying hydrated with plenty of water.  She is abstaining from alcohol and tobacco and drug use.  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  Agreeable to a flu shot however she does not have any insurance at this time and so flu shot was deferred until her follow-up visit.   Sore throat -she endorses 3 days of sore throat.  No rhinorrhea or congestion.  No coughing.  She has been using honey and tea.  She denies any fevers.  She denies any sick contacts.  She denies any rashes. Cough and sore throat   Review of Symptoms:  See HPI for ROS.   CC, SH/smoking status, and VS noted.  Objective: BP 120/64   Pulse (!) 106   Temp 99.4 F (37.4 C) (Oral)   Ht 5\' 6"  (1.676 m)   Wt 212 lb (96.2 kg)   LMP 10/30/2018   SpO2 99%   BMI 34.22 kg/m  GEN: NAD, alert, cooperative, and pleasant. EYE: no conjunctival injection, pupils equally round and reactive to light ENMT: normal tympanic light reflex, no nasal polyps,no rhinorrhea, no pharyngeal erythema or exudates NECK: full ROM, no thyromegaly RESPIRATORY: clear to auscultation bilaterally with no wheezes, rhonchi or rales, good effort CV: RRR, no m/r/g GI: soft, non-tender, non-distended, no hepatosplenomegaly SKIN: warm and dry, no rashes or lesions NEURO: II-XII  grossly intact, normal gait, peripheral sensation intact PSYCH: AAOx3, appropriate affect  Assessment and plan:  Positive pregnancy test Initial prenatal counseling completed at this visit.  Tobacco abuse and drug use are on her problem list but she denies these at this time.  -Red flags/reasons to call were discussed -  Initial OB labs were drawn.   - Patient is at 4 weeks and 1 day based on rudimentary dating but she will get formal dating at her initial OB visit.  -  Patient was advised to schedule her initial OB visit in the next 4 to 6 weeks.   Sore throat Likely viral.  Continue conservative management.  Avoid ibuprofen.  Tylenol is fine for pain.  Continue honey and lemon tea.   Orders Placed This Encounter  Procedures  . Culture, OB Urine  . Obstetric Panel, Including HIV([facility])  . HGB FRAC. W/SOLUBILITY  . POCT urine pregnancy  . POCT urinalysis dipstick      Howard Pouch, MD,MS,  PGY3 11/28/2018 4:14 PM

## 2018-11-28 NOTE — Patient Instructions (Addendum)
It was a pleasure seeing you today in our clinic. Congratulations on your pregnancy.  Please schedule follow up to be seen in the next 4-6 weeks.  Our clinic's number is [phone]. Please call with questions or concerns about what we discussed today.  Be well, Dr. Mosetta Putt    First Trimester of Pregnancy  The first trimester of pregnancy is from week 1 until the end of week 13 (months 1 through 3). During this time, your baby will begin to develop inside you. At 6-8 weeks, the eyes and face are formed, and the heartbeat can be seen on ultrasound. At the end of 12 weeks, all the baby's organs are formed. Prenatal care is all the medical care you receive before the birth of your baby. Make sure you get good prenatal care and follow all of your doctor's instructions. Follow these instructions at home: Medicines  Take over-the-counter and prescription medicines only as told by your doctor. Some medicines are safe and some medicines are not safe during pregnancy.  Take a prenatal vitamin that contains at least 600 micrograms (mcg) of folic acid.  If you have trouble pooping (constipation), take medicine that will make your stool soft (stool softener) if your doctor approves. Eating and drinking   Eat regular, healthy meals.  Your doctor will tell you the amount of weight gain that is right for you.  Avoid raw meat and uncooked cheese.  If you feel sick to your stomach (nauseous) or throw up (vomit): ? Eat 4 or 5 small meals a day instead of 3 large meals. ? Try eating a few soda crackers. ? Drink liquids between meals instead of during meals.  To prevent constipation: ? Eat foods that are high in fiber, like fresh fruits and vegetables, whole grains, and beans. ? Drink enough fluids to keep your pee (urine) clear or pale yellow. Activity  Exercise only as told by your doctor. Stop exercising if you have cramps or pain in your lower belly (abdomen) or low back.  Do not exercise if  it is too hot, too humid, or if you are in a place of great height (high altitude).  Try to avoid standing for long periods of time. Move your legs often if you must stand in one place for a long time.  Avoid heavy lifting.  Wear low-heeled shoes. Sit and stand up straight.  You can have sex unless your doctor tells you not to. Relieving pain and discomfort  Wear a good support bra if your breasts are sore.  Take warm water baths (sitz baths) to soothe pain or discomfort caused by hemorrhoids. Use hemorrhoid cream if your doctor says it is okay.  Rest with your legs raised if you have leg cramps or low back pain.  If you have puffy, bulging veins (varicose veins) in your legs: ? Wear support hose or compression stockings as told by your doctor. ? Raise (elevate) your feet for 15 minutes, 3-4 times a day. ? Limit salt in your food. Prenatal care  Schedule your prenatal visits by the twelfth week of pregnancy.  Write down your questions. Take them to your prenatal visits.  Keep all your prenatal visits as told by your doctor. This is important. Safety  Wear your seat belt at all times when driving.  Make a list of emergency phone numbers. The list should include numbers for family, friends, the hospital, and police and fire departments. General instructions  Ask your doctor for a referral to a local  prenatal class. Begin classes no later than at the start of month 6 of your pregnancy.  Ask for help if you need counseling or if you need help with nutrition. Your doctor can give you advice or tell you where to go for help.  Do not use hot tubs, steam rooms, or saunas.  Do not douche or use tampons or scented sanitary pads.  Do not cross your legs for long periods of time.  Avoid all herbs and alcohol. Avoid drugs that are not approved by your doctor.  Do not use any tobacco products, including cigarettes, chewing tobacco, and electronic cigarettes. If you need help  quitting, ask your doctor. You may get counseling or other support to help you quit.  Avoid cat litter boxes and soil used by cats. These carry germs that can cause birth defects in the baby and can cause a loss of your baby (miscarriage) or stillbirth.  Visit your dentist. At home, brush your teeth with a soft toothbrush. Be gentle when you floss. Contact a doctor if:  You are dizzy.  You have mild cramps or pressure in your lower belly.  You have a nagging pain in your belly area.  You continue to feel sick to your stomach, you throw up, or you have watery poop (diarrhea).  You have a bad smelling fluid coming from your vagina.  You have pain when you pee (urinate).  You have increased puffiness (swelling) in your face, hands, legs, or ankles. Get help right away if:  You have a fever.  You are leaking fluid from your vagina.  You have spotting or bleeding from your vagina.  You have very bad belly cramping or pain.  You gain or lose weight rapidly.  You throw up blood. It may look like coffee grounds.  You are around people who have [place] measles, fifth disease, or chickenpox.  You have a very bad headache.  You have shortness of breath.  You have any kind of trauma, such as from a fall or a car accident. Summary  The first trimester of pregnancy is from week 1 until the end of week 13 (months 1 through 3).  To take care of yourself and your unborn baby, you will need to eat healthy meals, take medicines only if your doctor tells you to do so, and do activities that are safe for you and your baby.  Keep all follow-up visits as told by your doctor. This is important as your doctor will have to ensure that your baby is healthy and growing well. This information is not intended to replace advice given to you by your health care provider. Make sure you discuss any questions you have with your health care provider. Document Released: 04/17/2008 Document Revised:  11/07/2016 Document Reviewed: 11/07/2016 Elsevier Interactive Patient Education  2019 [employer]

## 2018-11-28 NOTE — Assessment & Plan Note (Signed)
Likely viral.  Continue conservative management.  Avoid ibuprofen.  Tylenol is fine for pain.  Continue honey and lemon tea.

## 2018-11-28 NOTE — Assessment & Plan Note (Addendum)
Initial prenatal counseling completed at this visit.  Tobacco abuse and drug use are on her problem list but she denies these at this time.  -Red flags/reasons to call were discussed -  Initial OB labs were drawn.   - Patient is at 4 weeks and 1 day based on rudimentary dating but she will get formal dating at her initial OB visit.  -  Patient was advised to schedule her initial OB visit in the next 4 to 6 weeks.

## 2018-11-30 LAB — HGB FRAC. W/SOLUBILITY
HGB A2 QUANT: 3.9 % — AB (ref 1.8–3.2)
HGB C: 0 %
HGB F QUANT: 0 % (ref 0.0–2.0)
HGB S: 38.7 % — AB
HGB SOLUBILITY: POSITIVE — AB
Hgb A: 57.4 % — ABNORMAL LOW (ref 96.4–98.8)
Hgb Variant: 0 %

## 2018-11-30 LAB — OBSTETRIC PANEL, INCLUDING HIV
ANTIBODY SCREEN: NEGATIVE
BASOS: 0 %
Basophils Absolute: 0 10*3/uL (ref 0.0–0.2)
EOS (ABSOLUTE): 0.2 10*3/uL (ref 0.0–0.4)
Eos: 2 %
HEMATOCRIT: 37.4 % (ref 34.0–46.6)
HEMOGLOBIN: 12.5 g/dL (ref 11.1–15.9)
HEP B S AG: NEGATIVE
HIV SCREEN 4TH GENERATION: NONREACTIVE
IMMATURE GRANS (ABS): 0 10*3/uL (ref 0.0–0.1)
Immature Granulocytes: 0 %
LYMPHS: 26 %
Lymphocytes Absolute: 2.9 10*3/uL (ref 0.7–3.1)
MCH: 27.4 pg (ref 26.6–33.0)
MCHC: 33.4 g/dL (ref 31.5–35.7)
MCV: 82 fL (ref 79–97)
MONOCYTES: 8 %
Monocytes Absolute: 0.9 10*3/uL (ref 0.1–0.9)
NEUTROS ABS: 7.2 10*3/uL — AB (ref 1.4–7.0)
Neutrophils: 64 %
Platelets: 378 10*3/uL (ref 150–450)
RBC: 4.56 x10E6/uL (ref 3.77–5.28)
RDW: 11.7 % (ref 11.7–15.4)
RPR: NONREACTIVE
RUBELLA: 1.26 {index} (ref >0.99)
Rh Factor: POSITIVE
WBC: 11.2 10*3/uL — ABNORMAL HIGH (ref 3.4–10.8)

## 2018-12-02 LAB — URINE CULTURE, OB REFLEX

## 2018-12-02 LAB — CULTURE, OB URINE

## 2019-01-03 ENCOUNTER — Other Ambulatory Visit: Payer: Self-pay

## 2019-01-03 ENCOUNTER — Ambulatory Visit (INDEPENDENT_AMBULATORY_CARE_PROVIDER_SITE_OTHER): Payer: Medicaid Other | Admitting: Student in an Organized Health Care Education/Training Program

## 2019-01-03 ENCOUNTER — Ambulatory Visit: Payer: Self-pay | Admitting: Family Medicine

## 2019-01-03 ENCOUNTER — Encounter: Payer: Self-pay | Admitting: Student in an Organized Health Care Education/Training Program

## 2019-01-03 VITALS — BP 102/68 | HR 101 | Temp 98.4°F | Wt 214.6 lb

## 2019-01-03 DIAGNOSIS — Z3A09 9 weeks gestation of pregnancy: Secondary | ICD-10-CM

## 2019-01-03 NOTE — Progress Notes (Signed)
Alicia Dunlap is a 28 y.o. yo G1P1001 at 9 weeks and 2 days who presents for her initial prenatal visit.  Patient was scheduled as a regular OV rather than initial OB. Genetic screening and pelvic exam deferred to next visit.  Patient reports that the pregnancy is planned. She endorses symptoms of nausea with occasional vomiting. She has been trying to eat small frequent meals. She has been staying hydrated. She does not endorse vaginal bleeding or LOF.  PMH, POBH, FH, meds, allergies and Social Hx reviewed.  OB history  Patient reports one previous pregnancy 8 years ago which resulted in SVD at term. She did not have gestational HTN or diabetes during that pregnancy. She endorses no complications during that pregnancy.  Family hx  - patient denies family history of diabetes.  Prenatal Exam: Gen: Well nourished, well developed.  No distress.  Vitals noted. HEENT: Normocephalic, atraumatic.  Neck supple without cervical lymphadenopathy, thyromegaly or thyroid nodules.  CV: RRR no murmur, gallops or rubs Lungs: CTAB.  Normal respiratory effort without wheezes or rales. Abd: soft, NTND. +BS.  Uterus not appreciated above pelvis. GU: deferred until next visit as noted above Ext: No clubbing, cyanosis or edema. Psych: Normal grooming and dress.  Not depressed or anxious appearing.  Normal thought content and process without flight of ideas or looseness of associations.  Assessment & Plan: 1) 28 y.o. yo G2P1001 at [redacted]w[redacted]d, doing well. Dating is reliable. Prenatal labs reviewed, notable for sickle cell trait, which the patient was aware of. Genetic screening offered: Patient declines. Early glucola is not indicated.  PHQ-9 and Pregnancy Medical Home forms completed and reviewed. Patient declines flu shot despite counseling Will need genetic screening and pelvic completed at next OB visit  Bleeding and pain precautions reviewed. Discussed transition of [facility] to [facility] on February  23 Importance of prenatal vitamins reviewed.  Follow up in 4 weeks.

## 2019-01-03 NOTE — Patient Instructions (Signed)
It was a pleasure seeing you today in our clinic. Today we discussed your pregnancy. Here is the treatment plan we have discussed and agreed upon together:  Our clinic's number is [phone]. Please call with questions or concerns about what we discussed today.  Be well, Dr. Mosetta Putt   First Trimester of Pregnancy  The first trimester of pregnancy is from week 1 until the end of week 13 (months 1 through 3). During this time, your baby will begin to develop inside you. At 6-8 weeks, the eyes and face are formed, and the heartbeat can be seen on ultrasound. At the end of 12 weeks, all the baby's organs are formed. Prenatal care is all the medical care you receive before the birth of your baby. Make sure you get good prenatal care and follow all of your doctor's instructions. Follow these instructions at home: Medicines  Take over-the-counter and prescription medicines only as told by your doctor. Some medicines are safe and some medicines are not safe during pregnancy.  Take a prenatal vitamin that contains at least 600 micrograms (mcg) of folic acid.  If you have trouble pooping (constipation), take medicine that will make your stool soft (stool softener) if your doctor approves. Eating and drinking   Eat regular, healthy meals.  Your doctor will tell you the amount of weight gain that is right for you.  Avoid raw meat and uncooked cheese.  If you feel sick to your stomach (nauseous) or throw up (vomit): ? Eat 4 or 5 small meals a day instead of 3 large meals. ? Try eating a few soda crackers. ? Drink liquids between meals instead of during meals.  To prevent constipation: ? Eat foods that are high in fiber, like fresh fruits and vegetables, whole grains, and beans. ? Drink enough fluids to keep your pee (urine) clear or pale yellow. Activity  Exercise only as told by your doctor. Stop exercising if you have cramps or pain in your lower belly (abdomen) or low back.  Do not  exercise if it is too hot, too humid, or if you are in a place of great height (high altitude).  Try to avoid standing for long periods of time. Move your legs often if you must stand in one place for a long time.  Avoid heavy lifting.  Wear low-heeled shoes. Sit and stand up straight.  You can have sex unless your doctor tells you not to. Relieving pain and discomfort  Wear a good support bra if your breasts are sore.  Take warm water baths (sitz baths) to soothe pain or discomfort caused by hemorrhoids. Use hemorrhoid cream if your doctor says it is okay.  Rest with your legs raised if you have leg cramps or low back pain.  If you have puffy, bulging veins (varicose veins) in your legs: ? Wear support hose or compression stockings as told by your doctor. ? Raise (elevate) your feet for 15 minutes, 3-4 times a day. ? Limit salt in your food. Prenatal care  Schedule your prenatal visits by the twelfth week of pregnancy.  Write down your questions. Take them to your prenatal visits.  Keep all your prenatal visits as told by your doctor. This is important. Safety  Wear your seat belt at all times when driving.  Make a list of emergency phone numbers. The list should include numbers for family, friends, the hospital, and police and fire departments. General instructions  Ask your doctor for a referral to a local prenatal  class. Begin classes no later than at the start of month 6 of your pregnancy.  Ask for help if you need counseling or if you need help with nutrition. Your doctor can give you advice or tell you where to go for help.  Do not use hot tubs, steam rooms, or saunas.  Do not douche or use tampons or scented sanitary pads.  Do not cross your legs for long periods of time.  Avoid all herbs and alcohol. Avoid drugs that are not approved by your doctor.  Do not use any tobacco products, including cigarettes, chewing tobacco, and electronic cigarettes. If you need  help quitting, ask your doctor. You may get counseling or other support to help you quit.  Avoid cat litter boxes and soil used by cats. These carry germs that can cause birth defects in the baby and can cause a loss of your baby (miscarriage) or stillbirth.  Visit your dentist. At home, brush your teeth with a soft toothbrush. Be gentle when you floss. Contact a doctor if:  You are dizzy.  You have mild cramps or pressure in your lower belly.  You have a nagging pain in your belly area.  You continue to feel sick to your stomach, you throw up, or you have watery poop (diarrhea).  You have a bad smelling fluid coming from your vagina.  You have pain when you pee (urinate).  You have increased puffiness (swelling) in your face, hands, legs, or ankles. Get help right away if:  You have a fever.  You are leaking fluid from your vagina.  You have spotting or bleeding from your vagina.  You have very bad belly cramping or pain.  You gain or lose weight rapidly.  You throw up blood. It may look like coffee grounds.  You are around people who have [place] measles, fifth disease, or chickenpox.  You have a very bad headache.  You have shortness of breath.  You have any kind of trauma, such as from a fall or a car accident. Summary  The first trimester of pregnancy is from week 1 until the end of week 13 (months 1 through 3).  To take care of yourself and your unborn baby, you will need to eat healthy meals, take medicines only if your doctor tells you to do so, and do activities that are safe for you and your baby.  Keep all follow-up visits as told by your doctor. This is important as your doctor will have to ensure that your baby is healthy and growing well. This information is not intended to replace advice given to you by your health care provider. Make sure you discuss any questions you have with your health care provider. Document Released: 04/17/2008 Document Revised:  11/07/2016 Document Reviewed: 11/07/2016 Elsevier Interactive Patient Education  2019 [employer].

## 2019-01-05 ENCOUNTER — Encounter: Payer: Self-pay | Admitting: Family Medicine

## 2019-01-05 DIAGNOSIS — Z3480 Encounter for supervision of other normal pregnancy, unspecified trimester: Secondary | ICD-10-CM | POA: Insufficient documentation

## 2019-01-05 DIAGNOSIS — D573 Sickle-cell trait: Secondary | ICD-10-CM | POA: Insufficient documentation

## 2019-01-06 ENCOUNTER — Encounter (HOSPITAL_COMMUNITY): Payer: Self-pay | Admitting: *Deleted

## 2019-01-06 ENCOUNTER — Inpatient Hospital Stay (HOSPITAL_COMMUNITY): Payer: Medicaid Other

## 2019-01-06 ENCOUNTER — Other Ambulatory Visit: Payer: Self-pay

## 2019-01-06 ENCOUNTER — Inpatient Hospital Stay (HOSPITAL_COMMUNITY)
Admission: AD | Admit: 2019-01-06 | Discharge: 2019-01-06 | Disposition: A | Discharge status: Home / Self Care | Payer: Medicaid Other | Attending: Obstetrics & Gynecology | Admitting: Obstetrics & Gynecology

## 2019-01-06 DIAGNOSIS — Z3491 Encounter for supervision of normal pregnancy, unspecified, first trimester: Secondary | ICD-10-CM | POA: Diagnosis not present

## 2019-01-06 DIAGNOSIS — N9089 Other specified noninflammatory disorders of vulva and perineum: Secondary | ICD-10-CM | POA: Insufficient documentation

## 2019-01-06 DIAGNOSIS — Z87891 Personal history of nicotine dependence: Secondary | ICD-10-CM | POA: Insufficient documentation

## 2019-01-06 DIAGNOSIS — Z880 Allergy status to penicillin: Secondary | ICD-10-CM | POA: Insufficient documentation

## 2019-01-06 DIAGNOSIS — O26891 Other specified pregnancy related conditions, first trimester: Secondary | ICD-10-CM | POA: Insufficient documentation

## 2019-01-06 DIAGNOSIS — R109 Unspecified abdominal pain: Secondary | ICD-10-CM | POA: Insufficient documentation

## 2019-01-06 DIAGNOSIS — Z3A09 9 weeks gestation of pregnancy: Secondary | ICD-10-CM | POA: Diagnosis not present

## 2019-01-06 LAB — WET PREP, GENITAL
Clue Cells Wet Prep HPF POC: NONE SEEN
SPERM: NONE SEEN
TRICH WET PREP: NONE SEEN
YEAST WET PREP: NONE SEEN

## 2019-01-06 LAB — URINALYSIS, ROUTINE W REFLEX MICROSCOPIC
BILIRUBIN URINE: NEGATIVE
GLUCOSE, UA: NEGATIVE mg/dL
HGB URINE DIPSTICK: NEGATIVE
KETONES UR: NEGATIVE mg/dL
Leukocytes,Ua: NEGATIVE
Nitrite: NEGATIVE
PH: 7 (ref 5.0–8.0)
PROTEIN: NEGATIVE mg/dL
Specific Gravity, Urine: 1.016 (ref 1.005–1.030)

## 2019-01-06 LAB — CBC
HCT: 37.5 % (ref 36.0–46.0)
Hemoglobin: 12.1 g/dL (ref 12.0–15.0)
MCH: 26.7 pg (ref 26.0–34.0)
MCHC: 32.3 g/dL (ref 30.0–36.0)
MCV: 82.6 fL (ref 80.0–100.0)
Platelets: 296 10*3/uL (ref 150–400)
RBC: 4.54 MIL/uL (ref 3.87–5.11)
RDW: 12.2 % (ref 11.5–15.5)
WBC: 9.8 10*3/uL (ref 4.0–10.5)
nRBC: 0 % (ref 0.0–0.2)

## 2019-01-06 LAB — HCG, QUANTITATIVE, PREGNANCY: HCG, BETA CHAIN, QUANT, S: 69612 m[IU]/mL — AB (ref <5)

## 2019-01-06 NOTE — MAU Provider Note (Signed)
Chief Complaint: Abdominal Pain   First Provider Initiated Contact with Patient 01/06/19 [phone]     SUBJECTIVE HPI: Alicia Dunlap is a 28 y.o. G2P1001 at [redacted]w[redacted]d who presents to Maternity Admissions reporting abdominal pain & vaginal irritation.  Pain started this morning. Reports left sided abdominal pain & left flank pain. Pain is constant. Nothing makes better or worse. Denies dysuria, hematuria, n/v, or fever/chills.  Vaginal irritation the last few days. Had some bumps that have now opened up to sores and are painful. Symptoms started after wearing a new pair of lace underwear. Denies vaginal bleeding or vaginal discharge.  Had first OB appt with [facility] but hasn't had any imaging with this pregnancy.   Location: abdomen Quality: aching Severity: 7/10 on pain scale Duration: 1 day Timing: constant Modifying factors: none Associated signs and symptoms: vaginal irritaiton  Past Medical History:  Diagnosis Date  . Bacterial vaginosis    OB History  Gravida Para Term Preterm AB Living  2 1 1     1   SAB TAB Ectopic Multiple Live Births               # Outcome Date GA Lbr Len/2nd Weight Sex Delivery Anes PTL Lv  2 Current           1 Term      Vag-Spont      Past Surgical History:  Procedure Laterality Date  . NO PAST SURGERIES     Social History   Socioeconomic History  . Marital status: Single    Spouse name: Not on file  . Number of children: Not on file  . Years of education: Not on file  . Highest education level: Not on file  Occupational History  . Not on file  Social Needs  . Financial resource strain: Not on file  . Food insecurity:    Worry: Not on file    Inability: Not on file  . Transportation needs:    Medical: Not on file    Non-medical: Not on file  Tobacco Use  . Smoking status: Former Smoker    Types: Cigarettes  . Smokeless tobacco: Never Used  Substance and Sexual Activity  . Alcohol use: Not Currently  . Drug use: No  . Sexual activity: Yes   Lifestyle  . Physical activity:    Days per week: Not on file    Minutes per session: Not on file  . Stress: Not on file  Relationships  . Social connections:    Talks on phone: Not on file    Gets together: Not on file    Attends religious service: Not on file    Active member of club or organization: Not on file    Attends meetings of clubs or organizations: Not on file    Relationship status: Not on file  . Intimate partner violence:    Fear of current or ex partner: Not on file    Emotionally abused: Not on file    Physically abused: Not on file    Forced sexual activity: Not on file  Other Topics Concern  . Not on file  Social History Narrative   ** Merged History Encounter **       No family history on file. No current facility-administered medications on file prior to encounter.    Current Outpatient Medications on File Prior to Encounter  Medication Sig Dispense Refill  . Prenatal Vit-Fe Fumarate-FA (PRENATAL MULTIVITAMIN) TABS tablet Take 1 tablet by mouth daily at  12 noon.    . famotidine (PEPCID) 20 MG tablet Take 1 tablet (20 mg total) by mouth 2 (two) times daily. 30 tablet 0  . sucralfate (CARAFATE) 1 g tablet Take 1 tablet (1 g total) by mouth 4 (four) times daily. 30 tablet 0   Allergies  Allergen Reactions  . Amoxicillin Rash    Has patient had a PCN reaction causing immediate rash, facial/tongue/throat swelling, SOB or lightheadedness with hypotension: no Has patient had a PCN reaction causing severe rash involving mucus membranes or skin necrosis: no Has patient had a PCN reaction that required hospitalization no Has patient had a PCN reaction occurring within the last 10 years: yes If all of the above answers are "NO", then may proceed with Cephalosporin use.     I have reviewed patient's Past Medical Hx, Surgical Hx, Family Hx, Social Hx, medications and allergies.   Review of Systems  Constitutional: Negative.   Gastrointestinal: Positive for  abdominal pain. Negative for constipation, diarrhea, nausea and vomiting.  Genitourinary: Positive for flank pain and vaginal pain. Negative for dysuria, frequency, hematuria, vaginal bleeding and vaginal discharge.    OBJECTIVE Patient Vitals for the past 24 hrs:  BP Temp Pulse Resp Height Weight  01/06/19 1216 (!) 143/84 - 78 16 - -  01/06/19 0901 (!) 144/86 98.4 F (36.9 C) 99 18 5\' 7"  (1.702 m) 96.2 kg   Constitutional: Well-developed, well-nourished female in no acute distress.  Cardiovascular: normal rate & rhythm, no murmur Respiratory: normal rate and effort. Lung sounds clear throughout GI: Abd soft, non-tender, Pos BS x 4. No guarding or rebound tenderness MS: Extremities nontender, no edema, normal ROM Neurologic: Alert and oriented x 4.  GU:   Ulcerative lesion on mid left labia    LAB RESULTS Results for orders placed or performed during the hospital encounter of 01/06/19 (from the past 24 hour(s))  Urinalysis, Routine w reflex microscopic     Status: None   Collection Time: 01/06/19  8:57 AM  Result Value Ref Range   Color, Urine YELLOW YELLOW   APPearance CLEAR CLEAR   Specific Gravity, Urine 1.016 1.005 - 1.030   pH 7.0 5.0 - 8.0   Glucose, UA NEGATIVE NEGATIVE mg/dL   Hgb urine dipstick NEGATIVE NEGATIVE   Bilirubin Urine NEGATIVE NEGATIVE   Ketones, ur NEGATIVE NEGATIVE mg/dL   Protein, ur NEGATIVE NEGATIVE mg/dL   Nitrite NEGATIVE NEGATIVE   Leukocytes,Ua NEGATIVE NEGATIVE  Wet prep, genital     Status: Abnormal   Collection Time: 01/06/19  9:44 AM  Result Value Ref Range   Yeast Wet Prep HPF POC NONE SEEN NONE SEEN   Trich, Wet Prep NONE SEEN NONE SEEN   Clue Cells Wet Prep HPF POC NONE SEEN NONE SEEN   WBC, Wet Prep HPF POC FEW (A) NONE SEEN   Sperm NONE SEEN   CBC     Status: None   Collection Time: 01/06/19  9:47 AM  Result Value Ref Range   WBC 9.8 4.0 - 10.5 K/uL   RBC 4.54 3.87 - 5.11 MIL/uL   Hemoglobin 12.1 12.0 - 15.0 g/dL   HCT 51.7 00.1  - 74.9 %   MCV 82.6 80.0 - 100.0 fL   MCH 26.7 26.0 - 34.0 pg   MCHC 32.3 30.0 - 36.0 g/dL   RDW 44.9 67.5 - 91.6 %   Platelets 296 150 - 400 K/uL   nRBC 0.0 0.0 - 0.2 %  hCG, quantitative, pregnancy  Status: Abnormal   Collection Time: 01/06/19  9:47 AM  Result Value Ref Range   hCG, Beta Chain, Quant, S [phone] (H) <5 mIU/mL    IMAGING US Ob Less Than 14 Weeks With Ob Transvaginal  Result Date: 01/06/2019 CLINICAL DATA:  Pain.  Pregnant patient. EXAM: OBSTETRIC <14 WK [place] AND TRANSVAGINAL OB US TECHNIQUE: Both transabdominal and transvaginal ultrasound examinations were performed for complete evaluation of the gestation as well as the maternal uterus, adnexal regions, and pelvic cul-de-sac. Transvaginal technique was performed to assess early pregnancy. COMPARISON:  None. FINDINGS: Intrauterine gestational sac: Single Yolk sac:  Visualized. Embryo:  Visualized. Cardiac Activity: Visualized. Heart Rate: 154 bpm MSD:   mm    w     d CRL: 30.2 mm 9 w 6 d [place] EDC: August 05, 2019 Subchorionic hemorrhage:  None visualized. Maternal uterus/adnexae: The ovaries are normal in appearance with a corpus luteum cyst on the left. IMPRESSION: Normal study.  No cause for pain identified. Electronically Signed   By: Gerome Sam III M.D   On: 01/06/2019 11:10    MAU COURSE Orders Placed This Encounter  Procedures  . Wet prep, genital  . HSV Culture And Typing  . US OB LESS THAN 14 WEEKS WITH OB TRANSVAGINAL  . Urinalysis, Routine w reflex microscopic  . CBC  . hCG, quantitative, pregnancy  . HIV Antibody (routine testing w rflx)  . Discharge patient   No orders of the defined types were placed in this encounter.   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 Patient declines tx with valtrex at this time & would prefer to  wait for results.   ASSESSMENT 1. Normal IUP (intrauterine pregnancy) on prenatal ultrasound, first trimester   2. Abdominal pain during pregnancy in first trimester   3. Vulvar lesion     PLAN Discharge home in stable condition. SAB precautions  Allergies as of 01/06/2019      Reactions   Amoxicillin Rash   Has patient had a PCN reaction causing immediate rash, facial/tongue/throat swelling, SOB or lightheadedness with hypotension: no Has patient had a PCN reaction causing severe rash involving mucus membranes or skin necrosis: no Has patient had a PCN reaction that required hospitalization no Has patient had a PCN reaction occurring within the last 10 years: yes If all of the above answers are "NO", then may proceed with Cephalosporin use.      Medication List    STOP taking these medications   naproxen 500 MG tablet Commonly known as:  NAPROSYN   ondansetron 4 MG tablet Commonly known as:  ZOFRAN     TAKE these medications   famotidine 20 MG tablet Commonly known as:  PEPCID Take 1 tablet (20 mg total) by mouth 2 (two) times daily.   prenatal multivitamin Tabs tablet Take 1 tablet by mouth daily at 12 noon.   sucralfate 1 g tablet Commonly known as:  CARAFATE Take 1 tablet (1 g total) by mouth 4 (four) times daily.        Judeth Horn, NP 01/06/2019  12:46 PM

## 2019-01-06 NOTE — Discharge Instructions (Signed)
[facility] ° ° °[facility]       Phone: [phone] ° °[facility] Phone: [phone] ° °[facility] [place]  Phone: [phone] ° °[facility] [place]  Phone: [phone] ° °[facility] [place]  Phone: [phone] ° °[facility]       Phone: [phone] ° °[facility]    Phone: [phone]  ° °[facility] ([place]    Phone: [phone] ° °[facility]    Phone: [phone] ° °[facility]    Phone: [phone]  ° °[facility]  Phone: [phone] ° °[facility]    Phone: [phone] ° °[facility]   Phone: [phone] ° °[facility]    Phone: [phone] ° °Warning Signs During Pregnancy °A pregnancy lasts about 40 weeks, starting from the first day of your last period until the baby is born. Pregnancy is divided into three phases called trimesters. °· The first trimester refers to week 1 through week 13 of pregnancy. °· The second trimester is the start of week 14 through the end of week 27. °· The third trimester is the start of week 28 until you deliver your baby. °During each trimester of pregnancy, certain signs and symptoms may indicate a problem. Talk with your health care provider about your current health and any medical conditions you have. Make sure you know the symptoms that you should watch for and report. °How does this affect me? ° °Warning signs in the first trimester °While some changes during the first trimester may be uncomfortable, most do not represent a serious problem. Let your health care provider know if you have any of the following warning signs in the first trimester: °· You cannot eat or drink without vomiting, and  this lasts for longer than a day. °· You have vaginal bleeding or spotting along with menstrual-like cramping. °· You have diarrhea for longer than a day. °· You have a fever or other signs of infection, such as: °? Pain or burning when you urinate. °? Foul smelling or thick or yellowish vaginal discharge. °Warning signs in the second trimester °As your baby grows and changes during the second trimester, there are additional signs and symptoms that may indicate a problem. These include: °· Signs and symptoms of infection, including a fever. °· Signs or symptoms of a miscarriage or preterm labor, such as regular contractions, menstrual-like cramping, or lower abdominal pain. °· Bloody or watery vaginal discharge or obvious vaginal bleeding. °· Feeling like your heart is pounding. °· Having trouble breathing. °· Nausea, vomiting, or diarrhea that lasts for longer than a day. °· Craving non-food items, such as clay, chalk, or dirt. This may be a sign of a very treatable medical condition called pica. °Later in your second trimester, watch for signs and symptoms of a serious medical condition called preeclampsia.These include: °· Changes in your vision. °· A severe headache that does not go away. °· Nausea and vomiting. °It is also important to notice if your baby stops moving or moves less than usual during this time. °Warning signs in the third trimester °As you approach the third trimester, your baby is growing and your body is preparing for the birth of your baby. In your third trimester, be sure to let your health care provider know if: °· You   have signs and symptoms of infection, including a fever. °· You have vaginal bleeding. °· You notice that your baby is moving less than usual or is not moving. °· You have nausea, vomiting, or diarrhea that lasts for longer than a day. °· You have a severe headache that does not go away. °· You have vision changes, including seeing spots or having blurry or double  vision. °· You have increased swelling in your hands or face. °How does this affect my baby? °Throughout your pregnancy, always report any of the warning signs of a problem to your health care provider. This can help prevent complications that may affect your baby, including: °· Increased risk for premature birth. °· Infection that may be transmitted to your baby. °· Increased risk for stillbirth. °Contact a health care provider if: °· You have any of the warning signs of a problem for the current trimester of your pregnancy. °· Any of the following apply to you during any trimester of pregnancy: °? You have strong emotions, such as sadness or anxiety, that interfere with work or personal relationships. °? You feel unsafe in your home and need help finding a safe place to live. °? You are using tobacco products, alcohol, or drugs and you need help to stop. °Get help right away if: °You have signs or symptoms of labor before 37 weeks of pregnancy. These include: °· Contractions that are 5 minutes or less apart, or that increase in frequency, intensity, or length. °· Sudden, sharp abdominal pain or low back pain. °· Uncontrolled gush or trickle of fluid from your vagina. °Summary °· A pregnancy lasts about 40 weeks, starting from the first day of your last period until the baby is born. Pregnancy is divided into three phases called trimesters. Each trimester has warning signs to watch for. °· Always report any warning signs to your health care provider in order to prevent complications that may affect both you and your baby. °· Talk with your health care provider about your current health and any medical conditions you have. Make sure you know the symptoms that you should watch for and report. °This information is not intended to replace advice given to you by your health care provider. Make sure you discuss any questions you have with your health care provider. °Document Released: 08/16/2017 Document Revised:  08/16/2017 Document Reviewed: 08/16/2017 °Elsevier Interactive Patient Education © 2019 [employer] ° °

## 2019-01-06 NOTE — MAU Note (Signed)
Pt presents to MAU with complaints of vaginal irritation after wearing cotton underwear and left lower abdominal pain radiating into her back

## 2019-01-07 LAB — GC/CHLAMYDIA PROBE AMP (~~LOC~~) NOT AT ARMC
Chlamydia: NEGATIVE
Neisseria Gonorrhea: NEGATIVE

## 2019-01-07 LAB — HIV ANTIBODY (ROUTINE TESTING W REFLEX): HIV Screen 4th Generation wRfx: NONREACTIVE

## 2019-01-09 ENCOUNTER — Telehealth: Payer: Self-pay | Admitting: Student

## 2019-01-09 DIAGNOSIS — B009 Herpesviral infection, unspecified: Secondary | ICD-10-CM

## 2019-01-09 DIAGNOSIS — O98511 Other viral diseases complicating pregnancy, first trimester: Principal | ICD-10-CM

## 2019-01-09 MED ORDER — VALACYCLOVIR HCL 1 G PO TABS: 1000.0000 mg | ORAL_TABLET | Freq: Two times a day (BID) | ORAL | 0 refills | Status: DC

## 2019-01-09 NOTE — Telephone Encounter (Signed)
Pt returned phone call. Verified by name & DOB. Notified of positive HSV results. Confirmed pharmacy & allergies.  Discussed will need suppressive therapy at 36 wks.   Judeth Horn, NP

## 2019-01-09 NOTE — Telephone Encounter (Signed)
Left voicemail.  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 Also needs tx sent to pharmacy of choice.   Judeth Horn, NP

## 2019-01-10 ENCOUNTER — Telehealth: Payer: Self-pay | Admitting: Obstetrics and Gynecology

## 2019-01-10 LAB — HSV CULTURE AND TYPING

## 2019-01-10 NOTE — Telephone Encounter (Signed)
Wanted clarification on HSV Cx results; why it says positive but no typing done. Explained to patient the results appear to be positive, but the typing portion of the testing is still in process. Patient reports the bump she had is gone and she no longer has any symptoms. Advised can stop taking Valtrex and keep medication for next outbreak. Advised to take 1000 mg daily for subsequent outbreaks. Patient verbalized an understanding of the plan of care and agrees.   Raelyn Mora, CNM

## 2019-01-23 ENCOUNTER — Encounter: Payer: Self-pay | Admitting: Obstetrics

## 2019-01-23 ENCOUNTER — Other Ambulatory Visit (HOSPITAL_COMMUNITY)
Admission: RE | Admit: 2019-01-23 | Discharge: 2019-01-23 | Disposition: A | Discharge status: Home / Self Care | Payer: Medicaid Other | Source: Ambulatory Visit | Attending: Obstetrics | Admitting: Obstetrics

## 2019-01-23 ENCOUNTER — Other Ambulatory Visit: Payer: Self-pay

## 2019-01-23 ENCOUNTER — Ambulatory Visit (INDEPENDENT_AMBULATORY_CARE_PROVIDER_SITE_OTHER): Payer: Medicaid Other | Admitting: Obstetrics

## 2019-01-23 VITALS — BP 125/78 | HR 84 | Temp 98.3°F | Wt 213.7 lb

## 2019-01-23 DIAGNOSIS — Z3481 Encounter for supervision of other normal pregnancy, first trimester: Secondary | ICD-10-CM | POA: Diagnosis not present

## 2019-01-23 DIAGNOSIS — Z3480 Encounter for supervision of other normal pregnancy, unspecified trimester: Secondary | ICD-10-CM

## 2019-01-23 DIAGNOSIS — Z3A12 12 weeks gestation of pregnancy: Secondary | ICD-10-CM

## 2019-01-23 MED ORDER — PRENATE MINI 29-0.6-0.4-350 MG PO CAPS: 1.0000 | ORAL_CAPSULE | Freq: Every day | ORAL | 4 refills | Status: DC

## 2019-01-23 NOTE — Progress Notes (Signed)
Subjective:    Alicia Dunlap is being seen today for her first obstetrical visit.  This is a planned pregnancy. She is at [redacted]w[redacted]d gestation. Her obstetrical history is significant for none. Relationship with FOB: significant other, living together. Patient does intend to breast feed. Pregnancy history fully reviewed.  The information documented in the HPI was reviewed and verified.  Menstrual History: OB History    Gravida  2   Para  1   Term  1   Preterm      AB      Living  1     SAB      TAB      Ectopic      Multiple      Live Births               Patient's last menstrual period was 10/30/2018 (exact date).    Past Medical History:  Diagnosis Date  . Bacterial vaginosis     Past Surgical History:  Procedure Laterality Date  . NO PAST SURGERIES      (Not in a hospital admission)  Allergies  Allergen Reactions  . Amoxicillin Rash    Has patient had a PCN reaction causing immediate rash, facial/tongue/throat swelling, SOB or lightheadedness with hypotension: no Has patient had a PCN reaction causing severe rash involving mucus membranes or skin necrosis: no Has patient had a PCN reaction that required hospitalization no Has patient had a PCN reaction occurring within the last 10 years: yes If all of the above answers are "NO", then may proceed with Cephalosporin use.     Social History   Tobacco Use  . Smoking status: Former Smoker    Types: Cigarettes  . Smokeless tobacco: Never Used  Substance Use Topics  . Alcohol use: Not Currently    No family history on file.   Review of Systems Constitutional: negative for weight loss Gastrointestinal: negative for vomiting Genitourinary:negative for genital lesions and vaginal discharge and dysuria Musculoskeletal:negative for back pain Behavioral/Psych: negative for abusive relationship, depression, illegal drug usage and tobacco use    Objective:    BP 125/78   Pulse 84   Temp 98.3 F (36.8  C)   Wt 213 lb 11.2 oz (96.9 kg)   LMP 10/30/2018 (Exact Date)   BMI 33.47 kg/m  General Appearance:    Alert, cooperative, no distress, appears stated age  Head:    Normocephalic, without obvious abnormality, atraumatic  Eyes:    PERRL, conjunctiva/corneas clear, EOM's intact, fundi    benign, both eyes  Ears:    Normal TM's and external ear canals, both ears  Nose:   Nares normal, septum midline, mucosa normal, no drainage    or sinus tenderness  Throat:   Lips, mucosa, and tongue normal; teeth and gums normal  Neck:   Supple, symmetrical, trachea midline, no adenopathy;    thyroid:  no enlargement/tenderness/nodules; no carotid   bruit or JVD  Back:     Symmetric, no curvature, ROM normal, no CVA tenderness  Lungs:     Clear to auscultation bilaterally, respirations unlabored  Chest Wall:    No tenderness or deformity   Heart:    Regular rate and rhythm, S1 and S2 normal, no murmur, rub   or gallop  Breast Exam:    No tenderness, masses, or nipple abnormality  Abdomen:     Soft, non-tender, bowel sounds active all four quadrants,    no masses, no organomegaly  Genitalia:  Normal female without lesion, discharge or tenderness  Extremities:   Extremities normal, atraumatic, no cyanosis or edema  Pulses:   2+ and symmetric all extremities  Skin:   Skin color, texture, turgor normal, no rashes or lesions  Lymph nodes:   Cervical, supraclavicular, and axillary nodes normal  Neurologic:   CNII-XII intact, normal strength, sensation and reflexes    throughout      Lab Review Urine pregnancy test Labs reviewed yes Radiologic studies reviewed no  Assessment:    Pregnancy at [redacted]w[redacted]d weeks    Plan:     1. Supervision of other normal pregnancy, antepartum Rx: - Enroll Patient in Babyscripts - Obstetric Panel, Including HIV - Cervicovaginal ancillary only( [place]) - Cytology - PAP( [place]) - Genetic Screening - Prenat w/o A-FeCbn-Meth-FA-DHA (PRENATE MINI)  29-0.6-0.4-350 MG CAPS; Take 1 capsule by mouth daily before breakfast.  Dispense: 90 capsule; Refill: 4  Prenatal vitamins.  Counseling provided regarding continued use of seat belts, cessation of alcohol consumption, smoking or use of illicit drugs; infection precautions i.e., influenza/TDAP immunizations, toxoplasmosis,CMV, parvovirus, listeria and varicella; workplace safety, exercise during pregnancy; routine dental care, safe medications, sexual activity, hot tubs, saunas, pools, travel, caffeine use, fish and methlymercury, potential toxins, hair treatments, varicose veins Weight gain recommendations per IOM guidelines reviewed: underweight/BMI< 18.5--> gain 28 - 40 lbs; normal weight/BMI 18.5 - 24.9--> gain 25 - 35 lbs; overweight/BMI 25 - 29.9--> gain 15 - 25 lbs; obese/BMI >30->gain  11 - 20 lbs Problem list reviewed and updated. FIRST/CF mutation testing/NIPT/QUAD SCREEN/fragile X/Ashkenazi Jewish population testing/Spinal muscular atrophy discussed: requested. Role of ultrasound in pregnancy discussed; fetal survey: requested. Amniocentesis discussed: not indicated.  No orders of the defined types were placed in this encounter.  Orders Placed This Encounter  Procedures  . Obstetric Panel, Including HIV  . Genetic Screening    Follow up in 4 weeks. 50% of 20 min visit spent on counseling and coordination of care.     Brock Bad MD 01-23-2019

## 2019-01-23 NOTE — Progress Notes (Signed)
New OB.  C/o of intermittent left quadrant pain

## 2019-01-24 LAB — OBSTETRIC PANEL, INCLUDING HIV
ANTIBODY SCREEN: NEGATIVE
BASOS: 0 %
Basophils Absolute: 0 10*3/uL (ref 0.0–0.2)
EOS (ABSOLUTE): 0.2 10*3/uL (ref 0.0–0.4)
Eos: 1 %
HIV Screen 4th Generation wRfx: NONREACTIVE
Hematocrit: 34.2 % (ref 34.0–46.6)
Hemoglobin: 11.5 g/dL (ref 11.1–15.9)
Hepatitis B Surface Ag: NEGATIVE
Immature Grans (Abs): 0.1 10*3/uL (ref 0.0–0.1)
Immature Granulocytes: 1 %
Lymphocytes Absolute: 2.9 10*3/uL (ref 0.7–3.1)
Lymphs: 24 %
MCH: 28 pg (ref 26.6–33.0)
MCHC: 33.6 g/dL (ref 31.5–35.7)
MCV: 83 fL (ref 79–97)
Monocytes Absolute: 0.7 10*3/uL (ref 0.1–0.9)
Monocytes: 6 %
Neutrophils Absolute: 8 10*3/uL — ABNORMAL HIGH (ref 1.4–7.0)
Neutrophils: 68 %
Platelets: 331 10*3/uL (ref 150–450)
RBC: 4.11 x10E6/uL (ref 3.77–5.28)
RDW: 12.1 % (ref 11.7–15.4)
RPR Ser Ql: NONREACTIVE
Rh Factor: POSITIVE
Rubella Antibodies, IGG: 1.11 index (ref >0.99)
WBC: 11.8 10*3/uL — ABNORMAL HIGH (ref 3.4–10.8)

## 2019-01-24 LAB — CERVICOVAGINAL ANCILLARY ONLY
Bacterial vaginitis: NEGATIVE
Candida vaginitis: NEGATIVE
Trichomonas: NEGATIVE

## 2019-01-27 ENCOUNTER — Encounter: Payer: Self-pay | Admitting: Obstetrics & Gynecology

## 2019-01-27 LAB — CYTOLOGY - PAP
Diagnosis: NEGATIVE
HPV (WINDOPATH): NOT DETECTED

## 2019-01-31 ENCOUNTER — Encounter: Payer: Self-pay | Admitting: Obstetrics

## 2019-02-03 ENCOUNTER — Encounter: Payer: Medicaid Other | Admitting: Student in an Organized Health Care Education/Training Program

## 2019-02-10 ENCOUNTER — Encounter: Payer: Self-pay | Admitting: Obstetrics

## 2019-02-16 ENCOUNTER — Encounter (HOSPITAL_COMMUNITY): Payer: Self-pay

## 2019-02-16 ENCOUNTER — Other Ambulatory Visit: Payer: Self-pay

## 2019-02-16 ENCOUNTER — Inpatient Hospital Stay (HOSPITAL_COMMUNITY)
Admission: AD | Admit: 2019-02-16 | Discharge: 2019-02-16 | Disposition: A | Discharge status: Home / Self Care | Payer: Medicaid Other | Source: Ambulatory Visit | Attending: Obstetrics and Gynecology | Admitting: Obstetrics and Gynecology

## 2019-02-16 DIAGNOSIS — J302 Other seasonal allergic rhinitis: Secondary | ICD-10-CM | POA: Insufficient documentation

## 2019-02-16 DIAGNOSIS — O26893 Other specified pregnancy related conditions, third trimester: Secondary | ICD-10-CM | POA: Diagnosis not present

## 2019-02-16 DIAGNOSIS — Z880 Allergy status to penicillin: Secondary | ICD-10-CM | POA: Diagnosis not present

## 2019-02-16 DIAGNOSIS — K219 Gastro-esophageal reflux disease without esophagitis: Secondary | ICD-10-CM | POA: Diagnosis not present

## 2019-02-16 DIAGNOSIS — O99512 Diseases of the respiratory system complicating pregnancy, second trimester: Secondary | ICD-10-CM | POA: Insufficient documentation

## 2019-02-16 DIAGNOSIS — Z3A15 15 weeks gestation of pregnancy: Secondary | ICD-10-CM | POA: Diagnosis not present

## 2019-02-16 DIAGNOSIS — O99612 Diseases of the digestive system complicating pregnancy, second trimester: Secondary | ICD-10-CM | POA: Insufficient documentation

## 2019-02-16 DIAGNOSIS — Z79899 Other long term (current) drug therapy: Secondary | ICD-10-CM | POA: Insufficient documentation

## 2019-02-16 DIAGNOSIS — Z87891 Personal history of nicotine dependence: Secondary | ICD-10-CM | POA: Insufficient documentation

## 2019-02-16 DIAGNOSIS — Z3492 Encounter for supervision of normal pregnancy, unspecified, second trimester: Secondary | ICD-10-CM | POA: Diagnosis present

## 2019-02-16 MED ORDER — ALUM & MAG HYDROXIDE-SIMETH 200-200-20 MG/5ML PO SUSP
30.0000 mL | Freq: Once | ORAL | Status: AC
  Administered 2019-02-16: 08:00:00 30 mL via ORAL
  Filled 2019-02-16: qty 30

## 2019-02-16 MED ORDER — CALCIUM CARBONATE ANTACID 500 MG PO CHEW: 1.0000 | CHEWABLE_TABLET | Freq: Every day | ORAL | 0 refills | Status: DC

## 2019-02-16 MED ORDER — CIMETIDINE 200 MG PO TABS: 200.0000 mg | ORAL_TABLET | Freq: Two times a day (BID) | ORAL | 0 refills | Status: DC

## 2019-02-16 MED ORDER — LIDOCAINE VISCOUS HCL 2 % MT SOLN: 15.0000 mL | Freq: Once | OROMUCOSAL | Status: DC

## 2019-02-16 NOTE — MAU Provider Note (Addendum)
History     CSN: [id number]  Arrival date and time: 02/16/19 4174   First Provider Initiated Contact with Patient 02/16/19 0715      Chief Complaint  Patient presents with  . Chest Pain  . Cough   HPI Alicia Dunlap is a 28 y.o. G2P1001 at [redacted]w[redacted]d who presents to MAU with chief complaint of burning in her chest in the setting of GERD. She states her discomfort was so severe last night that she was unable to sleep. Her discomfort is aggravated by lying down. She denies alleviating factors. She has not taken medication or tried other treatments for this complaint. She denies nausea/vomiting, abdominal pain, vaginal bleeding, abnormal vaginal discharge, fever or recent illness. She did not receive a flu shot this season.  Patient reports secondary complaint of chest pain. She states she has pain 5/10 in her "chest muscles" which worsens when she raises or lowers her arms. She denies cardiac history, palpitations, SOB, syncope or weakness.   Patient reports sinus congestion and dry cough which she attributes to seasonal allergies.   Patient has not taken medication or tried other treatments for any of these complaints.  OB History    Gravida  2   Para  1   Term  1   Preterm      AB      Living  1     SAB      TAB      Ectopic      Multiple      Live Births              Past Medical History:  Diagnosis Date  . Bacterial vaginosis   . DENTAL CARIES 08/11/2010   Qualifier: Diagnosis of  By: [place] MD, Sarah    . Drug abuse, marijuana 09/06/2012  . High risk sexual behavior 02/12/2013   Multiple STD's checks: 8 since 2011.     Past Surgical History:  Procedure Laterality Date  . NO PAST SURGERIES      History reviewed. No pertinent family history.  Social History   Tobacco Use  . Smoking status: Former Smoker    Types: Cigarettes  . Smokeless tobacco: Never Used  Substance Use Topics  . Alcohol use: Not Currently  . Drug use: No    Allergies:   Allergies  Allergen Reactions  . Amoxicillin Rash    Has patient had a PCN reaction causing immediate rash, facial/tongue/throat swelling, SOB or lightheadedness with hypotension: no Has patient had a PCN reaction causing severe rash involving mucus membranes or skin necrosis: no Has patient had a PCN reaction that required hospitalization no Has patient had a PCN reaction occurring within the last 10 years: yes If all of the above answers are "NO", then may proceed with Cephalosporin use.     Medications Prior to Admission  Medication Sig Dispense Refill Last Dose  . famotidine (PEPCID) 20 MG tablet Take 1 tablet (20 mg total) by mouth 2 (two) times daily. 30 tablet 0   . Prenat w/o A-FeCbn-Meth-FA-DHA (PRENATE MINI) 29-0.6-0.4-350 MG CAPS Take 1 capsule by mouth daily before breakfast. 90 capsule 4   . Prenatal Vit-Fe Fumarate-FA (PRENATAL MULTIVITAMIN) TABS tablet Take 1 tablet by mouth daily at 12 noon.   01/05/2019 at Unknown time  . sucralfate (CARAFATE) 1 g tablet Take 1 tablet (1 g total) by mouth 4 (four) times daily. 30 tablet 0   . valACYclovir (VALTREX) 1000 MG tablet Take 1 tablet (1,000 mg  total) by mouth 2 (two) times daily. (Patient not taking: Reported on 01/23/2019) 20 tablet 0 Not Taking    Review of Systems  HENT: Positive for congestion, sinus pressure and sore throat.   Respiratory: Negative for shortness of breath.   Gastrointestinal: Negative for abdominal pain, nausea and vomiting.  Genitourinary: Negative for difficulty urinating, dysuria, flank pain, vaginal bleeding, vaginal discharge and vaginal pain.  Musculoskeletal: Negative for back pain.  Allergic/Immunologic: Positive for environmental allergies.  All other systems reviewed and are negative.  Physical Exam   Blood pressure 122/74, pulse 96, temperature 98.4 F (36.9 C), resp. rate 18, height  (1.702 m), weight 96.2 kg, last menstrual period 10/30/2018, SpO2 100 %.  Physical Exam  Nursing note  and vitals reviewed. Constitutional: She is oriented to person, place, and time. She appears well-developed and well-nourished.  Cardiovascular: Normal rate, normal heart sounds and normal pulses.  No signs of air hunger or dyspnea on exertion. Patient is speaking and ambulating simultaneously without difficulty.   Respiratory: Effort normal and breath sounds normal. No accessory muscle usage. No respiratory distress.  GI: Soft. She exhibits no distension. There is no abdominal tenderness. There is no rebound, no guarding and no CVA tenderness.  Genitourinary:    Genitourinary Comments: Not assessed based on chief complaint   Musculoskeletal: Normal range of motion.  Neurological: She is alert and oriented to person, place, and time. She has normal strength.  Skin: Skin is warm and dry.  Psychiatric: She has a normal mood and affect. Her behavior is normal. Judgment and thought content normal.    MAU Course/MDM  Procedures  --Triage unable to obtain doppler tones. FHT 150 bpm, obtained with bedside ultrasound. Patient informed that the ultrasound is considered a limited obstetric ultrasound and is not intended to be a complete ultrasound exam.  Patient also informed that the ultrasound is not being completed with the intent of assessing for fetal or placental anomalies or any pelvic abnormalities.  Explained that the purpose of today's ultrasound is to assess for fetal heart rate.  Patient acknowledges the purpose of the exam and the limitations of the study.    --Normal vital signs, afebrile, chest pain suspicious for musculoskeletal pain.   --Normal ECG with normal sinus rhythm in MAU  Patient Vitals for the past 24 hrs:  BP Temp Pulse Resp SpO2 Height Weight  02/16/19 0827 120/76 - 87 18 - - -  02/16/19 0700 122/74 98.4 F (36.9 C) 96 18 100 %  (1.702 m) 212 lb (96.2 kg)    Meds ordered this encounter  Medications  . alum & mag hydroxide-simeth (MAALOX/MYLANTA) 200-200-20  MG/5ML suspension 30 mL  . calcium carbonate (TUMS - DOSED IN MG ELEMENTAL CALCIUM) 500 MG chewable tablet    Sig: Chew 1 tablet (200 mg of elemental calcium total) by mouth daily.    Dispense:  30 tablet    Refill:  0    Order Specific Question:   Supervising Provider    Answer:   Reva Bores [[id number]]  . cimetidine (TAGAMET) 200 MG tablet    Sig: Take 1 tablet (200 mg total) by mouth 2 (two) times daily.    Dispense:  30 tablet    Refill:  0    Order Specific Question:   Supervising Provider    Answer:   Reva Bores [[id number]]    Assessment and Plan  --28 y.o. G2P1001 at [redacted]w[redacted]d  --FHT 150 bpm --GERD, seasonal allergies --Rx to  pharmacy for management of acid reflux --S/p normal ECG in MAU --Discharge home in stable condition  F/U:Next OB appt 04/09 at [facility]  Calvert Cantor, CNM 02/16/2019, 9:25 AM

## 2019-02-16 NOTE — MAU Note (Addendum)
Unable to sleep due to burning in chest. Allergies causing nasal congestion making it hard to breathe. Denies n/v. Does not take meds for heartburn. Chest pain worse when lays down. Does have some reflux.

## 2019-02-16 NOTE — Discharge Instructions (Signed)
Heartburn During Pregnancy ° °Heartburn is pain or discomfort in the throat or chest. It may cause a burning feeling. It happens when stomach acid moves up into the tube that carries food from your mouth to your stomach (esophagus). Heartburn is common during pregnancy. It usually goes away or gets better after giving birth. °Follow these instructions at home: °Eating and drinking °· Do not drink alcohol while you are pregnant. °· Figure out which foods and beverages make you feel worse, and avoid them. °· Beverages that you may want to avoid include: °? Coffee and tea (with or without caffeine). °? Energy drinks and sports drinks. °? Bubbly (carbonated) drinks or sodas. °? Citrus fruit juices. °· Foods that you may want to avoid include: °? Chocolate and cocoa. °? Peppermint and mint flavorings. °? Garlic, onions, and horseradish. °? Spicy and acidic foods. These include peppers, chili powder, curry powder, vinegar, hot sauces, and barbecue sauce. °? Citrus fruits, such as oranges, lemons, and limes. °? Tomato-based foods, such as red sauce, chili, and salsa. °? Fried and fatty foods, such as donuts, french fries, potato chips, and high-fat dressings. °? High-fat meats, such as hot dogs, cold cuts, sausage, ham, and bacon. °? High-fat dairy items, such as whole milk, butter, and cheese. °· Eat small meals often, instead of large meals. °· Avoid drinking a lot of liquid with your meals. °· Avoid eating meals during the 2-3 hours before you go to bed. °· Avoid lying down right after you eat. °· Do not exercise right after you eat. °Medicines °· Take over-the-counter and prescription medicines only as told by your doctor. °· Do not take aspirin, ibuprofen, or other NSAIDs unless your doctor tells you to do that. °· Your doctor may tell you to avoid medicines that have sodium bicarbonate in them. °General instructions ° °· If told, raise the head of your bed about 6 inches (15 cm). You can do this by putting blocks  under the legs. Sleeping with more pillows does not help with heartburn. °· Do not use any products that contain nicotine or tobacco, such as cigarettes and e-cigarettes. If you need help quitting, ask your doctor. °· Wear loose-fitting clothing. °· Try to lower your stress, such as with yoga or meditation. If you need help, ask your doctor. °· Stay at a healthy weight. If you are overweight, work with your doctor to safely lose weight. °· Keep all follow-up visits as told by your doctor. This is important. °Contact a doctor if: °· You get new symptoms. °· Your symptoms do not get better with treatment. °· You have weight loss and you do not know why. °· You have trouble swallowing. °· You make loud sounds when you breathe (wheeze). °· You have a cough that does not go away. °· You have heartburn often for more than 2 weeks. °· You feel sick to your stomach (nauseous), and this does not get better with treatment. °· You are throwing up (vomiting), and this does not get better with treatment. °· You have pain in your belly (abdomen). °Get help right away if: °· You have very bad chest pain that spreads to your arm, neck, or jaw. °· You feel sweaty, dizzy, or light-headed. °· You have trouble breathing. °· You have pain when swallowing. °· You throw up and your throw-up looks like blood or coffee grounds. °· Your poop (stool) is bloody or black. °This information is not intended to replace advice given to you by your health   care provider. Make sure you discuss any questions you have with your health care provider. °Document Released: 12/02/2010 Document Revised: 07/17/2016 Document Reviewed: 07/17/2016 °Elsevier Interactive Patient Education © 2019 Elsevier Inc. ° °

## 2019-02-20 ENCOUNTER — Other Ambulatory Visit: Payer: Self-pay

## 2019-02-20 ENCOUNTER — Ambulatory Visit (INDEPENDENT_AMBULATORY_CARE_PROVIDER_SITE_OTHER): Payer: Medicaid Other | Admitting: Obstetrics & Gynecology

## 2019-02-20 DIAGNOSIS — Z3480 Encounter for supervision of other normal pregnancy, unspecified trimester: Secondary | ICD-10-CM

## 2019-02-20 DIAGNOSIS — Z3A16 16 weeks gestation of pregnancy: Secondary | ICD-10-CM | POA: Diagnosis not present

## 2019-02-20 DIAGNOSIS — Z3481 Encounter for supervision of other normal pregnancy, first trimester: Secondary | ICD-10-CM | POA: Diagnosis not present

## 2019-02-20 MED ORDER — BLOOD PRESSURE MONITORING KIT: 1.0000 | PACK | Freq: Once | 0 refills | Status: AC

## 2019-02-20 NOTE — Progress Notes (Signed)
S/w pt via tele visit, pt reports feeling fetal flutter movements, denies pain. Pt does not have BP cuff, sent rx to pharmacy.

## 2019-02-20 NOTE — Patient Instructions (Signed)

## 2019-02-20 NOTE — Progress Notes (Signed)
   TELEHEALTH VIRTUAL OBSTETRICS VISIT ENCOUNTER NOTE  I connected with Alicia Dunlap on 02/20/19 at  3:45 PM EDT by telephone at home and verified that I am speaking with the correct person using two identifiers.   I discussed the limitations, risks, security and privacy concerns of performing an evaluation and management service by telephone and the availability of in person appointments. I also discussed with the patient that there may be a patient responsible charge related to this service. The patient expressed understanding and agreed to proceed.  Subjective:  Alicia Dunlap is a 28 y.o. G2P1001 at 71w1dbeing followed for ongoing prenatal care.  She is currently monitored for the following issues for this low-risk pregnancy and has Tobacco abuse; Supervision of other normal pregnancy, antepartum; and Sickle cell trait ([place] on their problem list.  Patient reports heartburn. Reports fetal movement. Denies any contractions, bleeding or leaking of fluid.   The following portions of the patient's history were reviewed and updated as appropriate: allergies, current medications, past family history, past medical history, past social history, past surgical history and problem list.   Objective:   General:  Alert, oriented and cooperative.   Mental Status: Normal mood and affect perceived. Normal judgment and thought content.  Rest of physical exam deferred due to type of encounter  Assessment and Plan:  Pregnancy: G2P1001 at 178w1d. Supervision of other normal pregnancy, antepartum Start babyscripts - Blood Pressure Monitoring KIT; 1 Device by Does not apply route once for 1 dose. Take BP weekly  Dispense: 1 kit; Refill: 0 - Babyscripts Schedule Optimization  Preterm labor symptoms and general obstetric precautions including but not limited to vaginal bleeding, contractions, leaking of fluid and fetal movement were reviewed in detail with the patient.  I discussed the assessment and  treatment plan with the patient. The patient was provided an opportunity to ask questions and all were answered. The patient agreed with the plan and demonstrated an understanding of the instructions. The patient was advised to call back or seek an in-person office evaluation/[facility] concerning [facility]. Please refer to After Visit Summary for other counseling recommendations.   I provided 10 minutes of non-face-to-face time during this encounter.  Return in about 4 weeks (around 03/20/2019).  No future appointments.  JaEmeterio ReeveMD [facility]

## 2019-03-12 ENCOUNTER — Other Ambulatory Visit: Payer: Self-pay

## 2019-03-12 ENCOUNTER — Ambulatory Visit (HOSPITAL_COMMUNITY)
Admission: RE | Admit: 2019-03-12 | Discharge: 2019-03-12 | Disposition: A | Discharge status: Home / Self Care | Payer: Medicaid Other | Source: Ambulatory Visit | Attending: Obstetrics and Gynecology | Admitting: Obstetrics and Gynecology

## 2019-03-12 DIAGNOSIS — Z3480 Encounter for supervision of other normal pregnancy, unspecified trimester: Secondary | ICD-10-CM | POA: Insufficient documentation

## 2019-03-12 DIAGNOSIS — Z363 Encounter for antenatal screening for malformations: Secondary | ICD-10-CM

## 2019-03-13 ENCOUNTER — Other Ambulatory Visit (HOSPITAL_COMMUNITY): Payer: Self-pay | Admitting: *Deleted

## 2019-03-13 DIAGNOSIS — Z362 Encounter for other antenatal screening follow-up: Secondary | ICD-10-CM

## 2019-03-20 ENCOUNTER — Encounter: Payer: Self-pay | Admitting: Obstetrics and Gynecology

## 2019-03-20 ENCOUNTER — Other Ambulatory Visit: Payer: Self-pay

## 2019-03-20 ENCOUNTER — Ambulatory Visit (INDEPENDENT_AMBULATORY_CARE_PROVIDER_SITE_OTHER): Payer: Medicaid Other | Admitting: Obstetrics and Gynecology

## 2019-03-20 DIAGNOSIS — D573 Sickle-cell trait: Secondary | ICD-10-CM

## 2019-03-20 DIAGNOSIS — Z3A20 20 weeks gestation of pregnancy: Secondary | ICD-10-CM

## 2019-03-20 DIAGNOSIS — Z3482 Encounter for supervision of other normal pregnancy, second trimester: Secondary | ICD-10-CM

## 2019-03-20 DIAGNOSIS — Z3480 Encounter for supervision of other normal pregnancy, unspecified trimester: Secondary | ICD-10-CM

## 2019-03-20 DIAGNOSIS — B009 Herpesviral infection, unspecified: Secondary | ICD-10-CM | POA: Insufficient documentation

## 2019-03-20 NOTE — Progress Notes (Signed)
Pt presents for webex visit. Pt identified with two pt identifiers. Pt has not been able to check bp. She states that she is in the process of moving, and has not picked cuff up from the pharmacy yet. Pt set up on babyrx today. Pt has no concerns today.

## 2019-03-20 NOTE — Progress Notes (Signed)
   TELEHEALTH VIRTUAL OBSTETRICS PRENATAL VISIT ENCOUNTER NOTE  I connected with Alicia Dunlap on 03/20/19 at  3:30 PM EDT by WebEx at home and verified that I am speaking with the correct person using two identifiers.   I discussed the limitations, risks, security and privacy concerns of performing an evaluation and management service by telephone and the availability of in person appointments. I also discussed with the patient that there may be a patient responsible charge related to this service. The patient expressed understanding and agreed to proceed. Subjective:  Alicia Dunlap is a 28 y.o. G2P1001 at [redacted]w[redacted]d being seen today for ongoing prenatal care.  She is currently monitored for the following issues for this low-risk pregnancy and has Tobacco abuse; Supervision of other normal pregnancy, antepartum; Sickle cell trait (HCC); and Herpes infection on their problem list.  Patient reports no complaints. She is worried about being an Programmer, applications, works at [employer]. Reports fetal movement. Contractions: Not present. Vag. Bleeding: None.  Movement: Present. Denies any contractions, bleeding or leaking of fluid.   The following portions of the patient's history were reviewed and updated as appropriate: allergies, current medications, past family history, past medical history, past social history, past surgical history and problem list.   Objective:  There were no vitals filed for this visit.  Fetal Status:     Movement: Present     General:  Alert, oriented and cooperative. Patient is in no acute distress.  Respiratory: Normal respiratory effort, no problems with respiration noted  Mental Status: Normal mood and affect. Normal behavior. Normal judgment and thought content.  Rest of physical exam deferred due to type of encounter  Assessment and Plan:  Pregnancy: G2P1001 at [redacted]w[redacted]d  1. Supervision of other normal pregnancy, antepartum - Babyscripts Schedule Optimization Briefly  reviewed options for contraception  2. Sickle cell trait (HCC) Urine culture Q trim  3. Herpes infection ppx 36 weeks   Preterm labor symptoms and general obstetric precautions including but not limited to vaginal bleeding, contractions, leaking of fluid and fetal movement were reviewed in detail with the patient. I discussed the assessment and treatment plan with the patient. The patient was provided an opportunity to ask questions and all were answered. The patient agreed with the plan and demonstrated an understanding of the instructions. The patient was advised to call back or seek an in-person office evaluation/go to MAU at [facility] for any urgent or concerning symptoms. Please refer to After Visit Summary for other counseling recommendations.   I provided 18 minutes of face-to-face via WebEx time during this encounter.  Return in about 4 weeks (around 04/17/2019) for OB visit, virtual.  Future Appointments  Date Time Provider Department Center  04/09/2019  3:15 PM WH-MFC [place] 4 WH-MFCUS MFC-US    Conan Bowens, MD Center for [facility], [facility]

## 2019-04-09 ENCOUNTER — Ambulatory Visit (HOSPITAL_COMMUNITY)
Admission: RE | Admit: 2019-04-09 | Discharge: 2019-04-09 | Disposition: A | Discharge status: Home / Self Care | Payer: Medicaid Other | Source: Ambulatory Visit | Attending: Maternal & Fetal Medicine | Admitting: Maternal & Fetal Medicine

## 2019-04-09 ENCOUNTER — Other Ambulatory Visit: Payer: Self-pay

## 2019-04-09 DIAGNOSIS — Z362 Encounter for other antenatal screening follow-up: Secondary | ICD-10-CM

## 2019-04-09 DIAGNOSIS — Z3A23 23 weeks gestation of pregnancy: Secondary | ICD-10-CM

## 2019-05-14 ENCOUNTER — Ambulatory Visit (INDEPENDENT_AMBULATORY_CARE_PROVIDER_SITE_OTHER): Payer: Medicaid Other | Admitting: Obstetrics & Gynecology

## 2019-05-14 ENCOUNTER — Other Ambulatory Visit: Payer: Medicaid Other

## 2019-05-14 ENCOUNTER — Other Ambulatory Visit (HOSPITAL_COMMUNITY)
Admission: RE | Admit: 2019-05-14 | Discharge: 2019-05-14 | Disposition: A | Discharge status: Home / Self Care | Payer: Medicaid Other | Source: Ambulatory Visit | Attending: Obstetrics & Gynecology | Admitting: Obstetrics & Gynecology

## 2019-05-14 ENCOUNTER — Other Ambulatory Visit: Payer: Self-pay

## 2019-05-14 ENCOUNTER — Encounter: Payer: Medicaid Other | Admitting: Obstetrics & Gynecology

## 2019-05-14 VITALS — BP 147/82 | HR 101 | Wt 235.0 lb

## 2019-05-14 DIAGNOSIS — N898 Other specified noninflammatory disorders of vagina: Secondary | ICD-10-CM

## 2019-05-14 DIAGNOSIS — O26893 Other specified pregnancy related conditions, third trimester: Secondary | ICD-10-CM | POA: Insufficient documentation

## 2019-05-14 DIAGNOSIS — Z3480 Encounter for supervision of other normal pregnancy, unspecified trimester: Secondary | ICD-10-CM | POA: Insufficient documentation

## 2019-05-14 DIAGNOSIS — Z3A28 28 weeks gestation of pregnancy: Secondary | ICD-10-CM

## 2019-05-14 NOTE — Patient Instructions (Signed)
Hypertension During Pregnancy °Hypertension is also called high blood pressure. High blood pressure means that the force of your blood moving in your body is too strong. It can cause problems for you and your baby. Different types of high blood pressure can happen during pregnancy. The types are: °· High blood pressure before you got pregnant. This is called chronic hypertension.  This can continue during your pregnancy. Your doctor will want to keep checking your blood pressure. You may need medicine to keep your blood pressure under control while you are pregnant. You will need follow-up visits after you have your baby. °· High blood pressure that goes up during pregnancy when it was normal before. This is called gestational hypertension. It will usually get better after you have your baby, but your doctor will need to watch your blood pressure to make sure that it is getting better. °· Very high blood pressure during pregnancy. This is called preeclampsia. Very high blood pressure is an emergency that needs to be checked and treated right away. °· You may develop very high blood pressure after giving birth. This is called postpartum preeclampsia. This usually occurs within 48 hours after childbirth but may occur up to 6 weeks after giving birth. This is rare. °How does this affect me? °If you have high blood pressure during pregnancy, you have a higher chance of developing high blood pressure: °· As you get older. °· If you get pregnant again. °In some cases, high blood pressure during pregnancy can cause: °· Stroke. °· Heart attack. °· Damage to the kidneys, lungs, or liver. °· Preeclampsia. °· Jerky movements you cannot control (convulsions or seizures). °· Problems with the placenta. °How does this affect my baby? °Your baby may: °· Be born early. °· Not weigh as much as he or she should. °· Not handle labor well, leading to a c-section birth. °What are the risks? °· Having high blood pressure during a past  pregnancy. °· Being overweight. °· Being 35 years old or older. °· Being pregnant for the first time. °· Being pregnant with more than one baby. °· Becoming pregnant using fertility methods, such as IVF. °· Having other problems, such as diabetes, or kidney disease. °· Having family members who have high blood pressure. °What can I do to lower my risk? ° °· Keep a healthy weight. °· Eat a healthy diet. °· Follow what your doctor tells you about treating any medical problems that you had before becoming pregnant. °It is very important to go to all of your doctor visits. Your doctor will check your blood pressure and make sure that your pregnancy is progressing as it should. Treatment should start early if a problem is found. °How is this treated? °Treatment for high blood pressure during pregnancy can differ depending on the type of high blood pressure you have and how serious it is. °· You may need to take blood pressure medicine. °· If you have been taking medicine for your blood pressure, you may need to change the medicine during pregnancy if it is not safe for your baby. °· If your doctor thinks that you could get very high blood pressure, he or she may tell you to take a low-dose aspirin during your pregnancy. °· If you have very high blood pressure, you may need to stay in the hospital so you and your baby can be watched closely. You may also need to take medicine to lower your blood pressure. This medicine may be given by mouth   or through an IV tube. °· In some cases, if your condition gets worse, you may need to have your baby early. °Follow these instructions at home: °Eating and drinking ° °· Drink enough fluid to keep your pee (urine) pale yellow. °· Avoid caffeine. °Lifestyle °· Do not use any products that contain nicotine or tobacco, such as cigarettes, e-cigarettes, and chewing tobacco. If you need help quitting, ask your doctor. °· Do not use alcohol or drugs. °· Avoid stress. °· Rest and get plenty  of sleep. °· Regular exercise can help. Ask your doctor what kinds of exercise are best for you. °General instructions °· Take over-the-counter and prescription medicines only as told by your doctor. °· Keep all prenatal and follow-up visits as told by your doctor. This is important. °Contact a doctor if: °· You have symptoms that your doctor told you to watch for, such as: °? Headaches. °? Nausea. °? Vomiting. °? Belly (abdominal) pain. °? Dizziness. °? Light-headedness. °Get help right away if: °· You have: °? Very bad belly pain that does not get better with treatment. °? A very bad headache that does not get better. °? Vomiting that does not get better. °? Sudden, fast weight gain. °? Sudden swelling in your hands, ankles, or face. °? Bleeding from your vagina. °? Blood in your pee. °? Blurry vision. °? Double vision. °? Shortness of breath. °? Chest pain. °? Weakness on one side of your body. °? Trouble talking. °· Your baby is not moving as much as usual. °Summary °· High blood pressure is also called hypertension. °· High blood pressure means that the force of your blood moving in your body is too strong. °· High blood pressure can cause problems for you and your baby. °· Keep all follow-up visits as told by your doctor. This is important. °This information is not intended to replace advice given to you by your health care provider. Make sure you discuss any questions you have with your health care provider. °Document Released: 12/02/2010 Document Revised: 02/20/2019 Document Reviewed: 11/26/2018 °Elsevier Patient Education © 2020 Elsevier Inc. ° °

## 2019-05-14 NOTE — Progress Notes (Signed)
   PRENATAL VISIT NOTE  Subjective:  Alicia Dunlap is a 28 y.o. G2P1001 at [redacted]w[redacted]d being seen today for ongoing prenatal care.  She is currently monitored for the following issues for this high-risk pregnancy and has Tobacco abuse; Supervision of other normal pregnancy, antepartum; Sickle cell trait ([place]); and Herpes infection on their problem list.  Patient reports no complaints.  Contractions: Not present. Vag. Bleeding: None.  Movement: Present. Denies leaking of fluid.   The following portions of the patient's history were reviewed and updated as appropriate: allergies, current medications, past family history, past medical history, past social history, past surgical history and problem list.   Objective:   Vitals:   05/14/19 0814  BP: (!) 147/82  Pulse: (!) 101  Weight: 235 lb (106.6 kg)    Fetal Status: Fetal Heart Rate (bpm): 135   Movement: Present     General:  Alert, oriented and cooperative. Patient is in no acute distress.  Skin: Skin is warm and dry. No rash noted.   Cardiovascular: Normal heart rate noted  Respiratory: Normal respiratory effort, no problems with respiration noted  Abdomen: Soft, gravid, appropriate for gestational age.  Pain/Pressure: Absent     Pelvic: Cervical exam deferred        Extremities: Normal range of motion.     Mental Status: Normal mood and affect. Normal behavior. Normal judgment and thought content.   Assessment and Plan:  Pregnancy: G2P1001 at [redacted]w[redacted]d 1. Supervision of other normal pregnancy, antepartum Evaluate for GHTN - Glucose Tolerance, 2 Hours w/1 Hour - CBC - HIV antibody (with reflex)- RPR - Cervicovaginal ancillary only( [place]) - Protein / creatinine ratio, urine - Comprehensive metabolic panel  2. Vaginal discharge during pregnancy in third trimester Needs to record BP at home in [facility] - Cervicovaginal ancillary only( [place])  Preterm labor symptoms and general obstetric precautions including but not limited  to vaginal bleeding, contractions, leaking of fluid and fetal movement were reviewed in detail with the patient. Please refer to After Visit Summary for other counseling recommendations.   Return in about 1 week (around 05/21/2019) for virtual.  No future appointments.  Emeterio Reeve, MD

## 2019-05-15 LAB — COMPREHENSIVE METABOLIC PANEL
ALT: 19 IU/L (ref 0–32)
AST: 19 IU/L (ref 0–40)
Albumin/Globulin Ratio: 1.4 (ref 1.2–2.2)
Albumin: 3.6 g/dL — ABNORMAL LOW (ref 3.9–5.0)
Alkaline Phosphatase: 80 IU/L (ref 39–117)
BUN/Creatinine Ratio: 9 (ref 9–23)
BUN: 5 mg/dL — ABNORMAL LOW (ref 6–20)
Bilirubin Total: <0.2 mg/dL (ref 0.0–1.2)
CO2: 20 mmol/L (ref 20–29)
Calcium: 8.4 mg/dL — ABNORMAL LOW (ref 8.7–10.2)
Chloride: 104 mmol/L (ref 96–106)
Creatinine, Ser: 0.55 mg/dL — ABNORMAL LOW (ref 0.57–1.00)
GFR calc Af Amer: 150 mL/min/{1.73_m2} (ref >59)
GFR calc non Af Amer: 130 mL/min/{1.73_m2} (ref >59)
Globulin, Total: 2.5 g/dL (ref 1.5–4.5)
Glucose: 113 mg/dL — ABNORMAL HIGH (ref 65–99)
Potassium: 3.6 mmol/L (ref 3.5–5.2)
Sodium: 137 mmol/L (ref 134–144)
Total Protein: 6.1 g/dL (ref 6.0–8.5)

## 2019-05-15 LAB — GLUCOSE TOLERANCE, 2 HOURS W/ 1HR
Glucose, 1 hour: 105 mg/dL (ref 65–179)
Glucose, 2 hour: 95 mg/dL (ref 65–152)
Glucose, Fasting: 78 mg/dL (ref 65–91)

## 2019-05-15 LAB — RPR: RPR Ser Ql: NONREACTIVE

## 2019-05-15 LAB — CERVICOVAGINAL ANCILLARY ONLY
Bacterial vaginitis: NEGATIVE
Candida vaginitis: NEGATIVE

## 2019-05-15 LAB — CBC
Hematocrit: 34.9 % (ref 34.0–46.6)
Hemoglobin: 11.6 g/dL (ref 11.1–15.9)
MCH: 27.8 pg (ref 26.6–33.0)
MCHC: 33.2 g/dL (ref 31.5–35.7)
MCV: 84 fL (ref 79–97)
Platelets: 263 10*3/uL (ref 150–450)
RBC: 4.17 x10E6/uL (ref 3.77–5.28)
RDW: 12.5 % (ref 11.7–15.4)
WBC: 12.3 10*3/uL — ABNORMAL HIGH (ref 3.4–10.8)

## 2019-05-15 LAB — HIV ANTIBODY (ROUTINE TESTING W REFLEX): HIV Screen 4th Generation wRfx: NONREACTIVE

## 2019-05-15 LAB — PROTEIN / CREATININE RATIO, URINE
Creatinine, Urine: 69.6 mg/dL
Protein, Ur: 6.9 mg/dL
Protein/Creat Ratio: 99 mg/g creat (ref 0–200)

## 2019-05-21 ENCOUNTER — Telehealth (INDEPENDENT_AMBULATORY_CARE_PROVIDER_SITE_OTHER): Payer: Medicaid Other | Admitting: Obstetrics & Gynecology

## 2019-05-21 ENCOUNTER — Encounter: Payer: Self-pay | Admitting: Obstetrics & Gynecology

## 2019-05-21 ENCOUNTER — Other Ambulatory Visit: Payer: Self-pay

## 2019-05-21 DIAGNOSIS — Z3483 Encounter for supervision of other normal pregnancy, third trimester: Secondary | ICD-10-CM | POA: Diagnosis not present

## 2019-05-21 DIAGNOSIS — Z3A29 29 weeks gestation of pregnancy: Secondary | ICD-10-CM | POA: Diagnosis not present

## 2019-05-21 DIAGNOSIS — Z3480 Encounter for supervision of other normal pregnancy, unspecified trimester: Secondary | ICD-10-CM

## 2019-05-21 MED ORDER — BLOOD PRESSURE KIT: PACK | 0 refills | Status: DC

## 2019-05-21 NOTE — Progress Notes (Signed)
I connected with Alicia Dunlap on 05/21/19 at  4:15 PM EDT by telephone and verified that I am speaking with the correct person using two identifiers.  Pt states she does not have a BP cuff. Order placed today.

## 2019-05-21 NOTE — Patient Instructions (Signed)

## 2019-05-21 NOTE — Progress Notes (Signed)
   TELEHEALTH VIRTUAL OBSTETRICS VISIT ENCOUNTER NOTE  I connected with Alicia Dunlap on 05/21/19 at  4:15 PM EDT by telephone at home and verified that I am speaking with the correct person using two identifiers.   I discussed the limitations, risks, security and privacy concerns of performing an evaluation and management service by telephone and the availability of in person appointments. I also discussed with the patient that there may be a patient responsible charge related to this service. The patient expressed understanding and agreed to proceed.  Subjective:  Alicia Dunlap is a 28 y.o. G2P1001 at [redacted]w[redacted]d being followed for ongoing prenatal care.  She is currently monitored for the following issues for this high-risk pregnancy and has Tobacco abuse; Supervision of other normal pregnancy, antepartum; Sickle cell trait ([place]); and Herpes infection on their problem list.  Patient reports no complaints. Reports fetal movement. Denies any contractions, bleeding or leaking of fluid.   The following portions of the patient's history were reviewed and updated as appropriate: allergies, current medications, past family history, past medical history, past social history, past surgical history and problem list.   Objective:   General:  Alert, oriented and cooperative.   Mental Status: Normal mood and affect perceived. Normal judgment and thought content.  Rest of physical exam deferred due to type of encounter  Assessment and Plan:  Pregnancy: G2P1001 at [redacted]w[redacted]d 1. Supervision of other normal pregnancy, antepartum Borderline BP last visit, we need to have BP recorded at home and in [facility] were reviewed and were wnl Preterm labor symptoms and general obstetric precautions including but not limited to vaginal bleeding, contractions, leaking of fluid and fetal movement were reviewed in detail with the patient.  I discussed the assessment and treatment plan with the patient. The patient was  provided an opportunity to ask questions and all were answered. The patient agreed with the plan and demonstrated an understanding of the instructions. The patient was advised to call back or seek an in-person office evaluation/go to MAU at [facility] for any urgent or concerning symptoms. Please refer to After Visit Summary for other counseling recommendations.   I provided 12 minutes of non-face-to-face time during this encounter.  Return in about 2 weeks (around 06/04/2019) for virtual, she needs to start BP and weight recording in [place]. She had borderline BP 1 week ago.  No future appointments.  Emeterio Reeve, MD [facility] [place], [place]

## 2019-05-22 ENCOUNTER — Encounter (HOSPITAL_COMMUNITY): Payer: Self-pay | Admitting: *Deleted

## 2019-05-22 ENCOUNTER — Inpatient Hospital Stay (HOSPITAL_COMMUNITY)
Admission: AD | Admit: 2019-05-22 | Discharge: 2019-05-22 | Disposition: A | Discharge status: Home / Self Care | Payer: Medicaid Other | Attending: Obstetrics and Gynecology | Admitting: Obstetrics and Gynecology

## 2019-05-22 ENCOUNTER — Telehealth: Payer: Self-pay

## 2019-05-22 DIAGNOSIS — M549 Dorsalgia, unspecified: Secondary | ICD-10-CM | POA: Diagnosis not present

## 2019-05-22 DIAGNOSIS — Z87891 Personal history of nicotine dependence: Secondary | ICD-10-CM | POA: Insufficient documentation

## 2019-05-22 DIAGNOSIS — O9989 Other specified diseases and conditions complicating pregnancy, childbirth and the puerperium: Secondary | ICD-10-CM | POA: Diagnosis not present

## 2019-05-22 DIAGNOSIS — R102 Pelvic and perineal pain: Secondary | ICD-10-CM | POA: Diagnosis not present

## 2019-05-22 DIAGNOSIS — R1030 Lower abdominal pain, unspecified: Secondary | ICD-10-CM | POA: Insufficient documentation

## 2019-05-22 DIAGNOSIS — Z3A29 29 weeks gestation of pregnancy: Secondary | ICD-10-CM | POA: Insufficient documentation

## 2019-05-22 DIAGNOSIS — Z880 Allergy status to penicillin: Secondary | ICD-10-CM | POA: Insufficient documentation

## 2019-05-22 DIAGNOSIS — O26893 Other specified pregnancy related conditions, third trimester: Secondary | ICD-10-CM | POA: Insufficient documentation

## 2019-05-22 LAB — COMPREHENSIVE METABOLIC PANEL
ALT: 22 U/L (ref 0–44)
AST: 16 U/L (ref 15–41)
Albumin: 3.1 g/dL — ABNORMAL LOW (ref 3.5–5.0)
Alkaline Phosphatase: 74 U/L (ref 38–126)
Anion gap: 11 (ref 5–15)
BUN: <5 mg/dL — ABNORMAL LOW (ref 6–20)
CO2: 21 mmol/L — ABNORMAL LOW (ref 22–32)
Calcium: 8.8 mg/dL — ABNORMAL LOW (ref 8.9–10.3)
Chloride: 105 mmol/L (ref 98–111)
Creatinine, Ser: 0.51 mg/dL (ref 0.44–1.00)
GFR calc Af Amer: >60 mL/min (ref >60)
GFR calc non Af Amer: >60 mL/min (ref >60)
Glucose, Bld: 78 mg/dL (ref 70–99)
Potassium: 3.7 mmol/L (ref 3.5–5.1)
Sodium: 137 mmol/L (ref 135–145)
Total Bilirubin: 0.4 mg/dL (ref 0.3–1.2)
Total Protein: 6.4 g/dL — ABNORMAL LOW (ref 6.5–8.1)

## 2019-05-22 LAB — URINALYSIS, ROUTINE W REFLEX MICROSCOPIC
Bilirubin Urine: NEGATIVE
Glucose, UA: NEGATIVE mg/dL
Hgb urine dipstick: NEGATIVE
Ketones, ur: NEGATIVE mg/dL
Leukocytes,Ua: NEGATIVE
Nitrite: NEGATIVE
Protein, ur: NEGATIVE mg/dL
Specific Gravity, Urine: 1.013 (ref 1.005–1.030)
pH: 5 (ref 5.0–8.0)

## 2019-05-22 LAB — CBC
HCT: 33.9 % — ABNORMAL LOW (ref 36.0–46.0)
Hemoglobin: 11.5 g/dL — ABNORMAL LOW (ref 12.0–15.0)
MCH: 28.4 pg (ref 26.0–34.0)
MCHC: 33.9 g/dL (ref 30.0–36.0)
MCV: 83.7 fL (ref 80.0–100.0)
Platelets: 262 10*3/uL (ref 150–400)
RBC: 4.05 MIL/uL (ref 3.87–5.11)
RDW: 12.3 % (ref 11.5–15.5)
WBC: 14.9 10*3/uL — ABNORMAL HIGH (ref 4.0–10.5)
nRBC: 0 % (ref 0.0–0.2)

## 2019-05-22 LAB — WET PREP, GENITAL
Clue Cells Wet Prep HPF POC: NONE SEEN
Sperm: NONE SEEN
Trich, Wet Prep: NONE SEEN
Yeast Wet Prep HPF POC: NONE SEEN

## 2019-05-22 LAB — PROTEIN / CREATININE RATIO, URINE
Creatinine, Urine: 104.35 mg/dL
Protein Creatinine Ratio: 0.09 mg/mg{Cre} (ref 0.00–0.15)
Total Protein, Urine: 9 mg/dL

## 2019-05-22 MED ORDER — COMFORT FIT MATERNITY SUPP LG MISC: 0 refills | Status: DC

## 2019-05-22 MED ORDER — CYCLOBENZAPRINE HCL 10 MG PO TABS: 10.0000 mg | ORAL_TABLET | Freq: Three times a day (TID) | ORAL | 0 refills | Status: DC | PRN

## 2019-05-22 MED ORDER — LACTATED RINGERS IV SOLN
Freq: Once | INTRAVENOUS | Status: AC
  Administered 2019-05-22: 16:00:00 via INTRAVENOUS

## 2019-05-22 MED ORDER — CYCLOBENZAPRINE HCL 10 MG PO TABS
10.0000 mg | ORAL_TABLET | Freq: Once | ORAL | Status: AC
  Administered 2019-05-22: 10 mg via ORAL
  Filled 2019-05-22: qty 1

## 2019-05-22 NOTE — MAU Provider Note (Signed)
History     CSN: [id number]  Arrival date and time: 05/22/19 1348   First Provider Initiated Contact with Patient 05/22/19 1457      Chief Complaint  Patient presents with  . Abdominal Pain  . Back Pain  . Pelvic Pain   HPI Alicia Dunlap 28 y.o. 77w1dComes to MAU with lower abdominal pain and pain that goes midline down into the vagina.  Has been working at home and sitting a lot and is having difficulty walking.  Has low back pain.  Is worried about preterm labor.  Reports having a high blood pressure at home.  Is wondering if she needs to be out of work.  OB History    Gravida  2   Para  1   Term  1   Preterm      AB      Living  1     SAB      TAB      Ectopic      Multiple      Live Births  1           Past Medical History:  Diagnosis Date  . Bacterial vaginosis   . DENTAL CARIES 08/11/2010   Qualifier: Diagnosis of  By: JMartiniqueMD, Sarah    . Drug abuse, marijuana 09/06/2012  . High risk sexual behavior 02/12/2013   Multiple STD's checks: 8 since 2011.     Past Surgical History:  Procedure Laterality Date  . NO PAST SURGERIES      History reviewed. No pertinent family history.  Social History   Tobacco Use  . Smoking status: Former Smoker    Types: Cigarettes  . Smokeless tobacco: Never Used  Substance Use Topics  . Alcohol use: Not Currently  . Drug use: Not Currently    Allergies:  Allergies  Allergen Reactions  . Amoxicillin Rash    Has patient had a PCN reaction causing immediate rash, facial/tongue/throat swelling, SOB or lightheadedness with hypotension: no Has patient had a PCN reaction causing severe rash involving mucus membranes or skin necrosis: no Has patient had a PCN reaction that required hospitalization no Has patient had a PCN reaction occurring within the last 10 years: yes If all of the above answers are "NO", then may proceed with Cephalosporin use.     Medications Prior to Admission  Medication Sig  Dispense Refill Last Dose  . calcium carbonate (TUMS - DOSED IN MG ELEMENTAL CALCIUM) 500 MG chewable tablet Chew 1 tablet (200 mg of elemental calcium total) by mouth daily. 30 tablet 0 05/22/2019 at Unknown time  . Prenat w/o A-FeCbn-Meth-FA-DHA (PRENATE MINI) 29-0.6-0.4-350 MG CAPS Take 1 capsule by mouth daily before breakfast. 90 capsule 4 05/22/2019 at Unknown time  . Prenatal Vit-Fe Fumarate-FA (PRENATAL MULTIVITAMIN) TABS tablet Take 1 tablet by mouth daily at 12 noon.   05/22/2019 at Unknown time  . Blood Pressure KIT Check blood pressure at home regularly size large z34.80 1 kit 0   . cimetidine (TAGAMET) 200 MG tablet Take 1 tablet (200 mg total) by mouth 2 (two) times daily. (Patient not taking: Reported on 03/20/2019) 30 tablet 0 More than a month at Unknown time  . famotidine (PEPCID) 20 MG tablet Take 1 tablet (20 mg total) by mouth 2 (two) times daily. (Patient not taking: Reported on 05/21/2019) 30 tablet 0 More than a month at Unknown time  . sucralfate (CARAFATE) 1 g tablet Take 1 tablet (1 g total) by mouth  4 (four) times daily. (Patient not taking: Reported on 05/21/2019) 30 tablet 0 More than a month at Unknown time  . valACYclovir (VALTREX) 1000 MG tablet Take 1 tablet (1,000 mg total) by mouth 2 (two) times daily. (Patient not taking: Reported on 05/21/2019) 20 tablet 0     Review of Systems  Constitutional: Negative for fever.  Respiratory: Negative for cough and shortness of breath.   Gastrointestinal: Positive for abdominal pain.  Genitourinary: Positive for pelvic pain and vaginal discharge. Negative for vaginal bleeding.  Musculoskeletal: Positive for back pain.   Physical Exam   Blood pressure 126/82, pulse 90, temperature 98.4 F (36.9 C), resp. rate 16, last menstrual period 10/30/2018, SpO2 98 %.  Physical Exam  Nursing note and vitals reviewed. Constitutional: She is oriented to person, place, and time. She appears well-developed and well-nourished.  HENT:  Head:  Normocephalic.  Eyes: EOM are normal.  Neck: Neck supple.  GI: Soft. There is no abdominal tenderness. There is no rebound and no guarding.  FHT baseline 130 with moderate variability.  No decelerations.  15x15 accels noted.  Irregular contractions that were not painful and lessened with IVF.  Reactive NST. No point tenderness over pubic symphysis  Genitourinary:    Genitourinary Comments: Speculum exam: Vagina - Small amount of white discharge, no odor Cervix - No contact bleeding, appears closed Bimanual exam: Cervix closed, thick, presenting part is high GC/Chlam, wet prep done Chaperone present for exam.    Musculoskeletal: Normal range of motion.  Neurological: She is alert and oriented to person, place, and time.  Skin: Skin is warm and dry.  Psychiatric: She has a normal mood and affect.   Vitals:   05/22/19 1545 05/22/19 1600 05/22/19 1649 05/22/19 1650  BP: 130/80 (!) 112/54  122/72  Pulse: 93 86  87  Resp:   16   Temp:   98.6 F (37 C)   TempSrc:   Oral   SpO2:        MAU Course  Procedures Results for orders placed or performed during the hospital encounter of 05/22/19 (from the past 24 hour(s))  Urinalysis, Routine w reflex microscopic     Status: None   Collection Time: 05/22/19  3:21 PM  Result Value Ref Range   Color, Urine YELLOW YELLOW   APPearance CLEAR CLEAR   Specific Gravity, Urine 1.013 1.005 - 1.030   pH 5.0 5.0 - 8.0   Glucose, UA NEGATIVE NEGATIVE mg/dL   Hgb urine dipstick NEGATIVE NEGATIVE   Bilirubin Urine NEGATIVE NEGATIVE   Ketones, ur NEGATIVE NEGATIVE mg/dL   Protein, ur NEGATIVE NEGATIVE mg/dL   Nitrite NEGATIVE NEGATIVE   Leukocytes,Ua NEGATIVE NEGATIVE  Protein / creatinine ratio, urine     Status: None   Collection Time: 05/22/19  3:21 PM  Result Value Ref Range   Creatinine, Urine 104.35 mg/dL   Total Protein, Urine 9 mg/dL   Protein Creatinine Ratio 0.09 0.00 - 0.15 mg/mg[Cre]  Wet prep, genital     Status: Abnormal    Collection Time: 05/22/19  3:31 PM   Specimen: Cervical/Vaginal swab  Result Value Ref Range   Yeast Wet Prep HPF POC NONE SEEN NONE SEEN   Trich, Wet Prep NONE SEEN NONE SEEN   Clue Cells Wet Prep HPF POC NONE SEEN NONE SEEN   WBC, Wet Prep HPF POC MANY (A) NONE SEEN   Sperm NONE SEEN   CBC     Status: Abnormal   Collection Time: 05/22/19  3:42  PM  Result Value Ref Range   WBC 14.9 (H) 4.0 - 10.5 K/uL   RBC 4.05 3.87 - 5.11 MIL/uL   Hemoglobin 11.5 (L) 12.0 - 15.0 g/dL   HCT 33.9 (L) 36.0 - 46.0 %   MCV 83.7 80.0 - 100.0 fL   MCH 28.4 26.0 - 34.0 pg   MCHC 33.9 30.0 - 36.0 g/dL   RDW 12.3 11.5 - 15.5 %   Platelets 262 150 - 400 K/uL   nRBC 0.0 0.0 - 0.2 %  Comprehensive metabolic panel     Status: Abnormal   Collection Time: 05/22/19  3:42 PM  Result Value Ref Range   Sodium 137 135 - 145 mmol/L   Potassium 3.7 3.5 - 5.1 mmol/L   Chloride 105 98 - 111 mmol/L   CO2 21 (L) 22 - 32 mmol/L   Glucose, Bld 78 70 - 99 mg/dL   BUN <5 (L) 6 - 20 mg/dL   Creatinine, Ser 0.51 0.44 - 1.00 mg/dL   Calcium 8.8 (L) 8.9 - 10.3 mg/dL   Total Protein 6.4 (L) 6.5 - 8.1 g/dL   Albumin 3.1 (L) 3.5 - 5.0 g/dL   AST 16 15 - 41 U/L   ALT 22 0 - 44 U/L   Alkaline Phosphatase 74 38 - 126 U/L   Total Bilirubin 0.4 0.3 - 1.2 mg/dL   GFR calc non Af Amer >60 >60 mL/min   GFR calc Af Amer >60 >60 mL/min   Anion gap 11 5 - 15    MDM Symptoms also consistent with pubic symphysis discomfort but no pain with palpation to pubic symphysis. Preeclampsia labs normal.  One mildly elevated BP but the rest were in normal range. 133/96 on arrival, but other BP were normal.   Client did find that flexeril PO in MAU did help her pain Requested note to be out of work.  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  Assessment and Plan  Pelvic pain - unknown cause Back pain in pregnancy Reactive NST  Plan Get your medicine from the pharmacy and take as needed. There is  no ovarian cyst on your last ultrasound.  It has resolved. Use an ice pack to your back. Get a pregnancy support belt and use it.  Call the office to find the address of the supplier. If you are no better in the next few days, call the office and be seen.  Alicia Dunlap 05/22/2019, 3:02 PM

## 2019-05-22 NOTE — Telephone Encounter (Signed)
Patient called stating that she is having "intense pain" in her vaginal and lower abdomen area. Pt reports she "feels like she cannot breathe". Pt also reports that she has experienced some light vaginal spotting, pt reports good fetal movement. I advised pt to call 911 if she is not able to breathe, or to have someone take her to the hospital ASAP. Pt verbalizes understanding.

## 2019-05-22 NOTE — MAU Note (Signed)
Patient started having abdominal and back pain, as well as vaginal pressure last night.  It has gotten worse overnight and has become a constant pain.  Denies vaginal bleeding or LOF.  Reports increased vaginal discharge that is occasionally brown.  No intercourse in past week.  Reports starting new job last week that involves a lot of sitting and has noticed more pain since starting the job.

## 2019-05-22 NOTE — MAU Note (Signed)
.   Alicia Dunlap is a 28 y.o. at [redacted]w[redacted]d here in MAU reporting:lower abdominal and pelvic pressure. Lower back pain. Reports scant amount of brown vaginal bleeding.   Onset of complaint: last night Pain score: 9 Vitals:   05/22/19 1417  BP: 136/86  Pulse: 94  Resp: 16  Temp: 98.4 F (36.9 C)  SpO2: 99%     FHT144 Lab orders placed from triage: UA

## 2019-05-22 NOTE — Discharge Instructions (Signed)
Get your medicine from the pharmacy and take as needed. There is no ovarian cyst on your last ultrasound.  It has resolved. Use an ice pack to your back. Get a pregnancy support belt and use it.  Call the office to find the address of the supplier. If you are no better in the next few days, call the office and be seen.

## 2019-05-23 LAB — GC/CHLAMYDIA PROBE AMP (~~LOC~~) NOT AT ARMC
Chlamydia: NEGATIVE
Neisseria Gonorrhea: NEGATIVE

## 2019-06-04 ENCOUNTER — Other Ambulatory Visit: Payer: Self-pay

## 2019-06-04 ENCOUNTER — Encounter (HOSPITAL_COMMUNITY): Payer: Self-pay | Admitting: *Deleted

## 2019-06-04 ENCOUNTER — Telehealth: Payer: Self-pay

## 2019-06-04 ENCOUNTER — Inpatient Hospital Stay (HOSPITAL_COMMUNITY)
Admission: AD | Admit: 2019-06-04 | Discharge: 2019-06-04 | Disposition: A | Discharge status: Home / Self Care | Payer: Medicaid Other | Attending: Obstetrics & Gynecology | Admitting: Obstetrics & Gynecology

## 2019-06-04 DIAGNOSIS — Z880 Allergy status to penicillin: Secondary | ICD-10-CM | POA: Diagnosis not present

## 2019-06-04 DIAGNOSIS — O99613 Diseases of the digestive system complicating pregnancy, third trimester: Secondary | ICD-10-CM | POA: Insufficient documentation

## 2019-06-04 DIAGNOSIS — Z87891 Personal history of nicotine dependence: Secondary | ICD-10-CM | POA: Insufficient documentation

## 2019-06-04 DIAGNOSIS — Z3A33 33 weeks gestation of pregnancy: Secondary | ICD-10-CM | POA: Diagnosis not present

## 2019-06-04 DIAGNOSIS — Z3A31 31 weeks gestation of pregnancy: Secondary | ICD-10-CM | POA: Insufficient documentation

## 2019-06-04 DIAGNOSIS — K219 Gastro-esophageal reflux disease without esophagitis: Secondary | ICD-10-CM | POA: Diagnosis present

## 2019-06-04 DIAGNOSIS — Z3689 Encounter for other specified antenatal screening: Secondary | ICD-10-CM

## 2019-06-04 LAB — COMPREHENSIVE METABOLIC PANEL
ALT: 36 U/L (ref 0–44)
AST: 32 U/L (ref 15–41)
Albumin: 3.1 g/dL — ABNORMAL LOW (ref 3.5–5.0)
Alkaline Phosphatase: 90 U/L (ref 38–126)
Anion gap: 8 (ref 5–15)
BUN: <5 mg/dL — ABNORMAL LOW (ref 6–20)
CO2: 24 mmol/L (ref 22–32)
Calcium: 8.6 mg/dL — ABNORMAL LOW (ref 8.9–10.3)
Chloride: 105 mmol/L (ref 98–111)
Creatinine, Ser: 0.57 mg/dL (ref 0.44–1.00)
GFR calc Af Amer: >60 mL/min (ref >60)
GFR calc non Af Amer: >60 mL/min (ref >60)
Glucose, Bld: 90 mg/dL (ref 70–99)
Potassium: 3.7 mmol/L (ref 3.5–5.1)
Sodium: 137 mmol/L (ref 135–145)
Total Bilirubin: 0.2 mg/dL — ABNORMAL LOW (ref 0.3–1.2)
Total Protein: 6.6 g/dL (ref 6.5–8.1)

## 2019-06-04 LAB — URINALYSIS, ROUTINE W REFLEX MICROSCOPIC
Bilirubin Urine: NEGATIVE
Glucose, UA: NEGATIVE mg/dL
Hgb urine dipstick: NEGATIVE
Ketones, ur: NEGATIVE mg/dL
Nitrite: NEGATIVE
Protein, ur: NEGATIVE mg/dL
Specific Gravity, Urine: 1.005 (ref 1.005–1.030)
pH: 8 (ref 5.0–8.0)

## 2019-06-04 LAB — CBC
HCT: 34.4 % — ABNORMAL LOW (ref 36.0–46.0)
Hemoglobin: 11.7 g/dL — ABNORMAL LOW (ref 12.0–15.0)
MCH: 28.3 pg (ref 26.0–34.0)
MCHC: 34 g/dL (ref 30.0–36.0)
MCV: 83.3 fL (ref 80.0–100.0)
Platelets: 263 10*3/uL (ref 150–400)
RBC: 4.13 MIL/uL (ref 3.87–5.11)
RDW: 12.5 % (ref 11.5–15.5)
WBC: 12.2 10*3/uL — ABNORMAL HIGH (ref 4.0–10.5)
nRBC: 0 % (ref 0.0–0.2)

## 2019-06-04 MED ORDER — ALUM & MAG HYDROXIDE-SIMETH 200-200-20 MG/5ML PO SUSP
30.0000 mL | Freq: Once | ORAL | Status: AC
  Administered 2019-06-04: 30 mL via ORAL
  Filled 2019-06-04: qty 30

## 2019-06-04 MED ORDER — HYDROXYZINE HCL 10 MG PO TABS
10.0000 mg | ORAL_TABLET | Freq: Once | ORAL | Status: AC
  Administered 2019-06-04: 10 mg via ORAL
  Filled 2019-06-04: qty 1

## 2019-06-04 MED ORDER — ACETAMINOPHEN 500 MG PO TABS
1000.0000 mg | ORAL_TABLET | Freq: Once | ORAL | Status: AC
  Administered 2019-06-04: 1000 mg via ORAL
  Filled 2019-06-04: qty 2

## 2019-06-04 NOTE — MAU Note (Signed)
.   Alicia Dunlap is a 28 y.o. at [redacted]w[redacted]d here in MAU reporting: pain her chest that comes and goes since yesterday. States it feels like she can't get a good breath at times. Denies any VB or LOF  Onset of complaint:06/03/19 Pain score: 8 Vitals:   06/04/19 1039  BP: 139/77  Pulse: 84  Resp: 18  Temp: 98.1 F (36.7 C)  SpO2: 100%     FHT:150 Lab orders placed from triage: UA

## 2019-06-04 NOTE — MAU Provider Note (Signed)
History     CSN: [id number]  Arrival date and time: 06/04/19 1024   First Provider Initiated Contact with Patient 06/04/19 1045      Chief Complaint  Patient presents with  . Chest Pain   HPI Alicia Dunlap is a 28 y.o. G2P1001 at 51w0dwho presents to MAU with chief complaints of chest pain and SOB. These are recurring problems which were first noted in second trimester. Her current episode started yesterday. [place] [place] advised to present to [facility]U for evaluation. Patient endorses 10/10 pain at the top of her fundus. She denies aggravating or alleviating factors.   Patient's problem list includes previous diagnosis of GERD. She states this pain has been more prolonged than other GERD-related episodes. She has multiple prescriptions for management of GERD but does not find them to be successful so she has not taken them.  Patient denies vaginal bleeding, leaking of fluid, decreased fetal movement, fever, falls, or recent illness.   OB History    Gravida  2   Para  1   Term  1   Preterm      AB      Living  1     SAB      TAB      Ectopic      Multiple      Live Births  1           Past Medical History:  Diagnosis Date  . Bacterial vaginosis   . DENTAL CARIES 08/11/2010   Qualifier: Diagnosis of  By: JMartiniqueMD, Sarah    . Drug abuse, marijuana 09/06/2012  . High risk sexual behavior 02/12/2013   Multiple STD's checks: 8 since 2011.     Past Surgical History:  Procedure Laterality Date  . NO PAST SURGERIES      History reviewed. No pertinent family history.  Social History   Tobacco Use  . Smoking status: Former Smoker    Types: Cigarettes  . Smokeless tobacco: Never Used  Substance Use Topics  . Alcohol use: Not Currently  . Drug use: Not Currently    Allergies:  Allergies  Allergen Reactions  . Amoxicillin Rash    Has patient had a PCN reaction causing immediate rash, facial/tongue/throat swelling, SOB or  lightheadedness with hypotension: no Has patient had a PCN reaction causing severe rash involving mucus membranes or skin necrosis: no Has patient had a PCN reaction that required hospitalization no Has patient had a PCN reaction occurring within the last 10 years: yes If all of the above answers are "NO", then may proceed with Cephalosporin use.     Medications Prior to Admission  Medication Sig Dispense Refill Last Dose  . cyclobenzaprine (FLEXERIL) 10 MG tablet Take 1 tablet (10 mg total) by mouth 3 (three) times daily as needed for up to 30 doses for muscle spasms. 30 tablet 0 Past Month at Unknown time  . Elastic Bandages & Supports (COMFORT FIT MATERNITY SUPP LG) MISC One maternity support belt to fit client 1 each 0 Past Month at Unknown time  . Prenatal Vit-Fe Fumarate-FA (PRENATAL MULTIVITAMIN) TABS tablet Take 1 tablet by mouth daily at 12 noon.   06/04/2019 at Unknown time  . Blood Pressure KIT Check blood pressure at home regularly size large z34.80 1 kit 0   . calcium carbonate (TUMS - DOSED IN MG ELEMENTAL CALCIUM) 500 MG chewable tablet Chew 1 tablet (200 mg of elemental calcium total) by mouth  daily. 30 tablet 0   . cimetidine (TAGAMET) 200 MG tablet Take 1 tablet (200 mg total) by mouth 2 (two) times daily. (Patient not taking: Reported on 03/20/2019) 30 tablet 0   . famotidine (PEPCID) 20 MG tablet Take 1 tablet (20 mg total) by mouth 2 (two) times daily. (Patient not taking: Reported on 05/21/2019) 30 tablet 0   . Prenat w/o A-FeCbn-Meth-FA-DHA (PRENATE MINI) 29-0.6-0.4-350 MG CAPS Take 1 capsule by mouth daily before breakfast. 90 capsule 4   . sucralfate (CARAFATE) 1 g tablet Take 1 tablet (1 g total) by mouth 4 (four) times daily. (Patient not taking: Reported on 05/21/2019) 30 tablet 0   . valACYclovir (VALTREX) 1000 MG tablet Take 1 tablet (1,000 mg total) by mouth 2 (two) times daily. (Patient not taking: Reported on 05/21/2019) 20 tablet 0 Unknown at Unknown time    Review of  Systems  Constitutional: Negative for chills, fatigue and fever.  Respiratory: Positive for chest tightness and shortness of breath. Negative for cough, choking and stridor.   Cardiovascular: Positive for chest pain. Negative for palpitations and leg swelling.  Gastrointestinal: Negative for abdominal pain, nausea and vomiting.  Genitourinary: Negative for difficulty urinating, dysuria, vaginal bleeding, vaginal discharge and vaginal pain.  Musculoskeletal: Negative for back pain.  Neurological: Negative for dizziness, syncope, weakness, numbness and headaches.  All other systems reviewed and are negative.  Physical Exam   Blood pressure 139/77, pulse 84, temperature 98.1 F (36.7 C), resp. rate 18, last menstrual period 10/30/2018, SpO2 100 %.  Physical Exam  Nursing note and vitals reviewed. Constitutional: She is oriented to person, place, and time. She appears well-developed and well-nourished.  Cardiovascular: Normal rate, intact distal pulses and normal pulses.  Respiratory: Breath sounds normal. No respiratory distress.  GI: Soft. She exhibits no distension. There is no abdominal tenderness. There is no rebound, no guarding and no CVA tenderness.  Neurological: She is alert and oriented to person, place, and time.  Skin: Skin is warm and dry.  Psychiatric: She has a normal mood and affect. Her behavior is normal. Judgment and thought content normal.    MAU Course/MDM  Procedures  --Normal EKG --Reactive tracing: baseline 125, moderate variability, positive accels, no decels --[place]: UI  --All symptoms resolved with GI cocktail. Discussed with patient that this is an indicator her pain is likely related to GERD. Advised diet and lifestyle modifications to reduce frequency and severity of episodes --Discussed hemodilution as possible agent in SOB in early third trimester. Counseled to monitor activity tolerance, avoid demanding tasks during hottest part of the day, continue to  prioritize PO hydration with water. Patient speaking rapidly and continuously throughout exam. No evidence of DOE, air hunger.   Patient Vitals for the past 24 hrs:  BP Temp Pulse Resp SpO2  06/04/19 1335 130/79 - - - -  06/04/19 1039 139/77 98.1 F (36.7 C) 84 18 100 %   Results for orders placed or performed during the hospital encounter of 06/04/19 (from the past 24 hour(s))  Urinalysis, Routine w reflex microscopic     Status: Abnormal   Collection Time: 06/04/19 10:45 AM  Result Value Ref Range   Color, Urine YELLOW YELLOW   APPearance HAZY (A) CLEAR   Specific Gravity, Urine 1.005 1.005 - 1.030   pH 8.0 5.0 - 8.0   Glucose, UA NEGATIVE NEGATIVE mg/dL   Hgb urine dipstick NEGATIVE NEGATIVE   Bilirubin Urine NEGATIVE NEGATIVE   Ketones, ur NEGATIVE NEGATIVE mg/dL   Protein,  ur NEGATIVE NEGATIVE mg/dL   Nitrite NEGATIVE NEGATIVE   Leukocytes,Ua TRACE (A) NEGATIVE   RBC / HPF 0-5 0 - 5 RBC/hpf   WBC, UA 0-5 0 - 5 WBC/hpf   Bacteria, UA RARE (A) NONE SEEN   Squamous Epithelial / LPF 11-20 0 - 5  CBC     Status: Abnormal   Collection Time: 06/04/19 11:52 AM  Result Value Ref Range   WBC 12.2 (H) 4.0 - 10.5 K/uL   RBC 4.13 3.87 - 5.11 MIL/uL   Hemoglobin 11.7 (L) 12.0 - 15.0 g/dL   HCT 34.4 (L) 36.0 - 46.0 %   MCV 83.3 80.0 - 100.0 fL   MCH 28.3 26.0 - 34.0 pg   MCHC 34.0 30.0 - 36.0 g/dL   RDW 12.5 11.5 - 15.5 %   Platelets 263 150 - 400 K/uL   nRBC 0.0 0.0 - 0.2 %  Comprehensive metabolic panel     Status: Abnormal   Collection Time: 06/04/19 11:52 AM  Result Value Ref Range   Sodium 137 135 - 145 mmol/L   Potassium 3.7 3.5 - 5.1 mmol/L   Chloride 105 98 - 111 mmol/L   CO2 24 22 - 32 mmol/L   Glucose, Bld 90 70 - 99 mg/dL   BUN <5 (L) 6 - 20 mg/dL   Creatinine, Ser 0.57 0.44 - 1.00 mg/dL   Calcium 8.6 (L) 8.9 - 10.3 mg/dL   Total Protein 6.6 6.5 - 8.1 g/dL   Albumin 3.1 (L) 3.5 - 5.0 g/dL   AST 32 15 - 41 U/L   ALT 36 0 - 44 U/L   Alkaline Phosphatase 90 38 -  126 U/L   Total Bilirubin 0.2 (L) 0.3 - 1.2 mg/dL   GFR calc non Af Amer >60 >60 mL/min   GFR calc Af Amer >60 >60 mL/min   Anion gap 8 5 - 15   Meds ordered this encounter  Medications  . acetaminophen (TYLENOL) tablet 1,000 mg  . alum & mag hydroxide-simeth (MAALOX/MYLANTA) 200-200-20 MG/5ML suspension 30 mL  . hydrOXYzine (ATARAX/VISTARIL) tablet 10 mg   Assessment and Plan  --28 y.o. G2P1001 at [redacted]w[redacted]d --Reactive tracing --GERD, continue previously prescribed medications --Discharge home in stable condition  F/U: [facility]   SDarlina Rumpf CElizabeth7/22/2020, 2:09 PM

## 2019-06-04 NOTE — Telephone Encounter (Signed)
Patient called in stating that she has been having chest pain and shortness of breath since last night. Patient describes the feeling as something sitting on her chest. Advised patient to be evaluated at the hospital as soon as possible, pt stated that she is on the way.

## 2019-06-04 NOTE — Discharge Instructions (Signed)

## 2019-06-09 ENCOUNTER — Encounter: Payer: Self-pay | Admitting: Family Medicine

## 2019-06-09 ENCOUNTER — Ambulatory Visit (INDEPENDENT_AMBULATORY_CARE_PROVIDER_SITE_OTHER): Payer: Medicaid Other | Admitting: Family Medicine

## 2019-06-09 DIAGNOSIS — O98513 Other viral diseases complicating pregnancy, third trimester: Secondary | ICD-10-CM

## 2019-06-09 DIAGNOSIS — Z3480 Encounter for supervision of other normal pregnancy, unspecified trimester: Secondary | ICD-10-CM

## 2019-06-09 DIAGNOSIS — O99013 Anemia complicating pregnancy, third trimester: Secondary | ICD-10-CM

## 2019-06-09 DIAGNOSIS — Z3A31 31 weeks gestation of pregnancy: Secondary | ICD-10-CM

## 2019-06-09 DIAGNOSIS — B009 Herpesviral infection, unspecified: Secondary | ICD-10-CM

## 2019-06-09 DIAGNOSIS — D573 Sickle-cell trait: Secondary | ICD-10-CM

## 2019-06-09 MED ORDER — FAMOTIDINE 20 MG PO TABS: 20.0000 mg | ORAL_TABLET | Freq: Two times a day (BID) | ORAL | 2 refills | Status: DC

## 2019-06-09 NOTE — Progress Notes (Signed)
   TELEHEALTH OBSTETRICS PRENATAL VIRTUAL VIDEO VISIT ENCOUNTER NOTE  Provider location: [facility] [place] [facility] [place]   I connected with Alicia Dunlap on 06/09/19 at  3:30 PM EDT by WebEx Video Encounter at home and verified that I am speaking with the correct person using two identifiers.   I discussed the limitations, risks, security and privacy concerns of performing an evaluation and management service virtually and the availability of in person appointments. I also discussed with the patient that there may be a patient responsible charge related to this service. The patient expressed understanding and agreed to proceed. Subjective:  Alicia Dunlap is a 28 y.o. G2P1001 at [redacted]w[redacted]d being seen today for ongoing prenatal care.  She is currently monitored for the following issues for this low-risk pregnancy and has Tobacco abuse; Supervision of other normal pregnancy, antepartum; Sickle cell trait ([place]); and Herpes infection on their problem list.  Patient reports no complaints.  Contractions: Irritability. Vag. Bleeding: None.  Movement: Present. Denies any leaking of fluid.   The following portions of the patient's history were reviewed and updated as appropriate: allergies, current medications, past family history, past medical history, past social history, past surgical history and problem list.   Objective:  There were no vitals filed for this visit.  Fetal Status:     Movement: Present     General:  Alert, oriented and cooperative. Patient is in no acute distress.  Respiratory: Normal respiratory effort, no problems with respiration noted  Mental Status: Normal mood and affect. Normal behavior. Normal judgment and thought content.  Rest of physical exam deferred due to type of encounter  Imaging: No results found.  Assessment and Plan:  Pregnancy: G2P1001 at [redacted]w[redacted]d 1. Supervision of other normal pregnancy, antepartum Good fetal movement  2. Sickle cell trait ([place])   3. Herpes infection Start valtrex in 4 weeks.  Preterm labor symptoms and general obstetric precautions including but not limited to vaginal bleeding, contractions, leaking of fluid and fetal movement were reviewed in detail with the patient. I discussed the assessment and treatment plan with the patient. The patient was provided an opportunity to ask questions and all were answered. The patient agreed with the plan and demonstrated an understanding of the instructions. The patient was advised to call back or seek an in-person office evaluation/go to MAU at [facility] for any urgent or concerning symptoms. Please refer to After Visit Summary for other counseling recommendations.   I provided 9 minutes of face-to-face time during this encounter.  Return for OB f/u, In Office.  No future appointments.  [place] for [employer], [place]

## 2019-06-09 NOTE — Progress Notes (Signed)
I connected with  Alicia Dunlap on 06/09/19 by a video enabled telemedicine application and verified that I am speaking with the correct person using two identifiers.   WebEX OB.

## 2019-07-07 ENCOUNTER — Ambulatory Visit (INDEPENDENT_AMBULATORY_CARE_PROVIDER_SITE_OTHER): Payer: Medicaid Other | Admitting: Obstetrics and Gynecology

## 2019-07-07 ENCOUNTER — Encounter: Payer: Self-pay | Admitting: Obstetrics and Gynecology

## 2019-07-07 ENCOUNTER — Other Ambulatory Visit: Payer: Self-pay

## 2019-07-07 ENCOUNTER — Other Ambulatory Visit (HOSPITAL_COMMUNITY)
Admission: RE | Admit: 2019-07-07 | Discharge: 2019-07-07 | Disposition: A | Discharge status: Home / Self Care | Payer: Medicaid Other | Source: Ambulatory Visit | Attending: Obstetrics and Gynecology | Admitting: Obstetrics and Gynecology

## 2019-07-07 VITALS — BP 124/78 | HR 86 | Wt 236.9 lb

## 2019-07-07 DIAGNOSIS — O23593 Infection of other part of genital tract in pregnancy, third trimester: Secondary | ICD-10-CM

## 2019-07-07 DIAGNOSIS — Z3480 Encounter for supervision of other normal pregnancy, unspecified trimester: Secondary | ICD-10-CM | POA: Diagnosis present

## 2019-07-07 DIAGNOSIS — Z3A35 35 weeks gestation of pregnancy: Secondary | ICD-10-CM

## 2019-07-07 DIAGNOSIS — D573 Sickle-cell trait: Secondary | ICD-10-CM

## 2019-07-07 DIAGNOSIS — O99013 Anemia complicating pregnancy, third trimester: Secondary | ICD-10-CM

## 2019-07-07 DIAGNOSIS — B009 Herpesviral infection, unspecified: Secondary | ICD-10-CM

## 2019-07-07 MED ORDER — VALACYCLOVIR HCL 500 MG PO TABS: 500.0000 mg | ORAL_TABLET | Freq: Two times a day (BID) | ORAL | 6 refills | Status: DC

## 2019-07-07 NOTE — Progress Notes (Signed)
   PRENATAL VISIT NOTE  Subjective:  Alicia Dunlap is a 28 y.o. G2P1001 at [redacted]w[redacted]d being seen today for ongoing prenatal care.  She is currently monitored for the following issues for this low-risk pregnancy and has Tobacco abuse; Supervision of other normal pregnancy, antepartum; Sickle cell trait ([place]); and Herpes infection on their problem list.  Patient reports no complaints.  Contractions: Not present. Vag. Bleeding: None.  Movement: Present. Denies leaking of fluid.   The following portions of the patient's history were reviewed and updated as appropriate: allergies, current medications, past family history, past medical history, past social history, past surgical history and problem list.   Objective:   Vitals:   07/07/19 1545  BP: 124/78  Pulse: 86  Weight: 236 lb 14.4 oz (107.5 kg)    Fetal Status: Fetal Heart Rate (bpm): 135   Movement: Present     General:  Alert, oriented and cooperative. Patient is in no acute distress.  Skin: Skin is warm and dry. No rash noted.   Cardiovascular: Normal heart rate noted  Respiratory: Normal respiratory effort, no problems with respiration noted  Abdomen: Soft, gravid, appropriate for gestational age.  Pain/Pressure: Absent     Pelvic: Cervical exam deferred        Extremities: Normal range of motion.  Edema: Trace  Mental Status: Normal mood and affect. Normal behavior. Normal judgment and thought content.   Assessment and Plan:  Pregnancy: G2P1001 at [redacted]w[redacted]d  1. Supervision of other normal pregnancy, antepartum - Strep Gp B NAA - Cervicovaginal ancillary only( [place])  2. Sickle cell trait ([place])  3. Herpes infection - Has not had an outbreak since she found out, not started suppression yet - denies lesions or symptoms currently - reviewed importance of why she should take suppression, goal is to prevent transmission to neonate and prevent need for primary c-section - script sent to pharmacy, she will start   Preterm  labor symptoms and general obstetric precautions including but not limited to vaginal bleeding, contractions, leaking of fluid and fetal movement were reviewed in detail with the patient. Please refer to After Visit Summary for other counseling recommendations.   Return in about 1 week (around 07/14/2019) for OB visit, virtual.  No future appointments.  Sloan Leiter, MD

## 2019-07-07 NOTE — Progress Notes (Signed)
Patient reports fetal movement, denies pain. 

## 2019-07-09 ENCOUNTER — Encounter: Payer: Medicaid Other | Admitting: Obstetrics & Gynecology

## 2019-07-09 LAB — CERVICOVAGINAL ANCILLARY ONLY
Chlamydia: NEGATIVE
Neisseria Gonorrhea: NEGATIVE

## 2019-07-09 LAB — STREP GP B NAA: Strep Gp B NAA: NEGATIVE

## 2019-07-14 ENCOUNTER — Telehealth (INDEPENDENT_AMBULATORY_CARE_PROVIDER_SITE_OTHER): Payer: Medicaid Other | Admitting: Obstetrics

## 2019-07-14 ENCOUNTER — Other Ambulatory Visit: Payer: Self-pay

## 2019-07-14 ENCOUNTER — Encounter: Payer: Self-pay | Admitting: Obstetrics

## 2019-07-14 VITALS — BP 126/83 | HR 83

## 2019-07-14 DIAGNOSIS — D573 Sickle-cell trait: Secondary | ICD-10-CM

## 2019-07-14 DIAGNOSIS — B009 Herpesviral infection, unspecified: Secondary | ICD-10-CM

## 2019-07-14 DIAGNOSIS — Z3A36 36 weeks gestation of pregnancy: Secondary | ICD-10-CM

## 2019-07-14 DIAGNOSIS — O98313 Other infections with a predominantly sexual mode of transmission complicating pregnancy, third trimester: Secondary | ICD-10-CM

## 2019-07-14 DIAGNOSIS — Z3480 Encounter for supervision of other normal pregnancy, unspecified trimester: Secondary | ICD-10-CM

## 2019-07-14 DIAGNOSIS — O99013 Anemia complicating pregnancy, third trimester: Secondary | ICD-10-CM

## 2019-07-14 NOTE — Progress Notes (Signed)
   TELEHEALTH OBSTETRICS PRENATAL VIRTUAL VIDEO VISIT ENCOUNTER NOTE  Provider location: [facility] [place] [facility] [place]   I connected with [name] on 07/14/19 at  2:30 PM EDT by WebEx OB MyChart Video Encounter at home and verified that I am speaking with the correct person using two identifiers.   I discussed the limitations, risks, security and privacy concerns of performing an evaluation and management service virtually and the availability of in person appointments. I also discussed with the patient that there may be a patient responsible charge related to this service. The patient expressed understanding and agreed to proceed. Subjective:  Alicia Dunlap is a 28 y.o. G2P1001 at [redacted]w[redacted]d being seen today for ongoing prenatal care.  She is currently monitored for the following issues for this low-risk pregnancy and has Tobacco abuse; Supervision of other normal pregnancy, antepartum; Sickle cell trait ([place]); and Herpes infection on their problem list.  Patient reports no complaints.  Contractions: Irritability. Vag. Bleeding: None.  Movement: Present. Denies any leaking of fluid.   The following portions of the patient's history were reviewed and updated as appropriate: allergies, current medications, past family history, past medical history, past social history, past surgical history and problem list.   Objective:   Vitals:   07/14/19 1413  BP: 126/83  Pulse: 83    Fetal Status:     Movement: Present     General:  Alert, oriented and cooperative. Patient is in no acute distress.  Respiratory: Normal respiratory effort, no problems with respiration noted  Mental Status: Normal mood and affect. Normal behavior. Normal judgment and thought content.  Rest of physical exam deferred due to type of encounter  Imaging: No results found.  Assessment and Plan:  Pregnancy: G2P1001 at [redacted]w[redacted]d 1. Supervision of other normal pregnancy, antepartu  2. Sickle cell trait ([facility])   3. Herpes infection   Preterm labor symptoms and general obstetric precautions including but not limited to vaginal bleeding, contractions, leaking of fluid and fetal movement were reviewed in detail with the patient. I discussed the assessment and treatment plan with the patient. The patient was provided an opportunity to ask questions and all were answered. The patient agreed with the plan and demonstrated an understanding of the instructions. The patient was advised to call back or seek an in-person office evaluation/go to MAU at [facility] for any urgent or concerning symptoms. Please refer to After Visit Summary for other counseling recommendations.   I provided 10 minutes of face-to-face time during this encounter.  Return in about 1 week (around 07/21/2019) for MyChart.  Future Appointments  Date Time Provider [place]  07/14/2019  2:30 PM Shelly Bombard, MD [place] None    Baltazar Najjar, [place] for [facility], [facility] 07/14/2019

## 2019-07-14 NOTE — Progress Notes (Signed)
I connected with  Alicia Dunlap on 07/14/19 by a video enabled telemedicine application and verified that I am speaking with the correct person using two identifiers.   MyChart ROB.  Reports no problems today.  Did her GBS/GC last week.

## 2019-07-22 ENCOUNTER — Telehealth (INDEPENDENT_AMBULATORY_CARE_PROVIDER_SITE_OTHER): Payer: Medicaid Other | Admitting: Obstetrics

## 2019-07-22 ENCOUNTER — Encounter: Payer: Self-pay | Admitting: Obstetrics

## 2019-07-22 DIAGNOSIS — D573 Sickle-cell trait: Secondary | ICD-10-CM

## 2019-07-22 DIAGNOSIS — B009 Herpesviral infection, unspecified: Secondary | ICD-10-CM

## 2019-07-22 DIAGNOSIS — O99891 Other specified diseases and conditions complicating pregnancy: Secondary | ICD-10-CM

## 2019-07-22 DIAGNOSIS — Z3A37 37 weeks gestation of pregnancy: Secondary | ICD-10-CM

## 2019-07-22 DIAGNOSIS — M549 Dorsalgia, unspecified: Secondary | ICD-10-CM

## 2019-07-22 DIAGNOSIS — Z3480 Encounter for supervision of other normal pregnancy, unspecified trimester: Secondary | ICD-10-CM

## 2019-07-22 DIAGNOSIS — O9989 Other specified diseases and conditions complicating pregnancy, childbirth and the puerperium: Secondary | ICD-10-CM

## 2019-07-22 NOTE — Progress Notes (Signed)
   TELEHEALTH OBSTETRICS PRENATAL VIRTUAL VIDEO VISIT ENCOUNTER NOTE  Provider location: [facility] [place] [facility] [place]   I connected with [name] on 07/22/19 at 11:15 AM EDT by OB MyChart Video Encounter at home and verified that I am speaking with the correct person using two identifiers.   I discussed the limitations, risks, security and privacy concerns of performing an evaluation and management service virtually and the availability of in person appointments. I also discussed with the patient that there may be a patient responsible charge related to this service. The patient expressed understanding and agreed to proceed. Subjective:  Alicia Dunlap is a 28 y.o. G2P1001 at [redacted]w[redacted]d being seen today for ongoing prenatal care.  She is currently monitored for the following issues for this low-risk pregnancy and has Tobacco abuse; Supervision of other normal pregnancy, antepartum; Sickle cell trait ([place]); and Herpes infection on their problem list.  Patient reports backache.  Contractions: Irritability. Vag. Bleeding: None.  Movement: Present. Denies any leaking of fluid.   The following portions of the patient's history were reviewed and updated as appropriate: allergies, current medications, past family history, past medical history, past social history, past surgical history and problem list.   Objective:  There were no vitals filed for this visit.  Fetal Status:     Movement: Present     General:  Alert, oriented and cooperative. Patient is in no acute distress.  Respiratory: Normal respiratory effort, no problems with respiration noted  Mental Status: Normal mood and affect. Normal behavior. Normal judgment and thought content.  Rest of physical exam deferred due to type of encounter  Imaging: No results found.  Assessment and Plan:  Pregnancy: G2P1001 at [redacted]w[redacted]d  1. Supervision of other normal pregnancy, antepartum  2. Sickle cell trait ([facility])  3. Herpes infection  - Valtrex prn, and suppression starting in 3rd trimester  4. Back pain affecting pregnancy in third trimester - [facility]   Preterm labor symptoms and general obstetric precautions including but not limited to vaginal bleeding, contractions, leaking of fluid and fetal movement were reviewed in detail with the patient. I discussed the assessment and treatment plan with the patient. The patient was provided an opportunity to ask questions and all were answered. The patient agreed with the plan and demonstrated an understanding of the instructions. The patient was advised to call back or seek an in-person office evaluation/go to MAU at [facility] for any urgent or concerning symptoms. Please refer to After Visit Summary for other counseling recommendations.   I provided 10 minutes of face-to-face time during this encounter.  Return in about 1 week (around 07/29/2019) for ROB in person.  Future Appointments  Date Time Provider [place]  07/29/2019  2:15 PM Elvera Maria, CNM [facility] None  08/05/2019  2:35 PM Nugent, Gerrie Nordmann, NP [place] None    Baltazar Najjar, [place] for [facility], [place] [facility] 07/22/2019

## 2019-07-22 NOTE — Progress Notes (Signed)
Virtual ROB     

## 2019-07-29 ENCOUNTER — Ambulatory Visit (INDEPENDENT_AMBULATORY_CARE_PROVIDER_SITE_OTHER): Payer: Medicaid Other | Admitting: Advanced Practice Midwife

## 2019-07-29 ENCOUNTER — Other Ambulatory Visit: Payer: Self-pay

## 2019-07-29 DIAGNOSIS — Z3480 Encounter for supervision of other normal pregnancy, unspecified trimester: Secondary | ICD-10-CM

## 2019-07-29 DIAGNOSIS — Z3A38 38 weeks gestation of pregnancy: Secondary | ICD-10-CM

## 2019-07-29 DIAGNOSIS — Z3483 Encounter for supervision of other normal pregnancy, third trimester: Secondary | ICD-10-CM

## 2019-07-29 NOTE — Patient Instructions (Signed)
Labor Precautions Reasons to come to MAU at [facility]:  1.  Contractions are  5 minutes apart or less, each last 1 minute, these have been going on for 1-2 hours, and you cannot walk or talk during them 2.  You have a large gush of fluid, or a trickle of fluid that will not stop and you have to wear a pad 3.  You have bleeding that is bright red, heavier than spotting--like menstrual bleeding (spotting can be normal in early labor or after a check of your cervix) 4.  You do not feel the baby moving like he/she normally does  

## 2019-07-29 NOTE — Progress Notes (Signed)
   PRENATAL VISIT NOTE  Subjective:  Alicia Dunlap is a 28 y.o. G2P1001 at [redacted]w[redacted]d being seen today for ongoing prenatal care.  She is currently monitored for the following issues for this low-risk pregnancy and has Tobacco abuse; Supervision of other normal pregnancy, antepartum; Sickle cell trait ([place]); and Herpes infection on their problem list.  Patient reports occasional contractions.  Contractions: Not present. Vag. Bleeding: None.  Movement: Present. Denies leaking of fluid.   The following portions of the patient's history were reviewed and updated as appropriate: allergies, current medications, past family history, past medical history, past social history, past surgical history and problem list.   Objective:   Vitals:   07/29/19 1453  BP: 127/83  Pulse: 88  Weight: 245 lb 11.2 oz (111.4 kg)    Fetal Status: Fetal Heart Rate (bpm): 159   Movement: Present  Presentation: Vertex  General:  Alert, oriented and cooperative. Patient is in no acute distress.  Skin: Skin is warm and dry. No rash noted.   Cardiovascular: Normal heart rate noted  Respiratory: Normal respiratory effort, no problems with respiration noted  Abdomen: Soft, gravid, appropriate for gestational age.  Pain/Pressure: Present     Pelvic: Cervical exam performed Dilation: 1 Effacement (%): 50 Station: -3  Extremities: Normal range of motion.  Edema: None  Mental Status: Normal mood and affect. Normal behavior. Normal judgment and thought content.   Assessment and Plan:  Pregnancy: G2P1001 at [redacted]w[redacted]d 1. Supervision of other normal pregnancy, antepartum --Anticipatory guidance about next visits/weeks of pregnancy given. --Next visit at 40 weeks in office for NST  2. Genital HSV --No s/sx of outbreak per pt --Taking suppressive dose of Valtrex  Term labor symptoms and general obstetric precautions including but not limited to vaginal bleeding, contractions, leaking of fluid and fetal movement were reviewed in  detail with the patient. Please refer to After Visit Summary for other counseling recommendations.   Return in about 1 week (around 08/05/2019).  Future Appointments  Date Time Provider [place]  08/05/2019  2:35 PM Nugent, Gerrie Nordmann, NP [facility] None    Fatima Blank, CNM

## 2019-07-30 NOTE — Addendum Note (Signed)
Addended by: Fatima Blank A on: 07/30/2019 12:33 AM   Modules accepted: Orders, SmartSet

## 2019-08-05 ENCOUNTER — Encounter: Payer: Self-pay | Admitting: Women's Health

## 2019-08-05 ENCOUNTER — Telehealth (INDEPENDENT_AMBULATORY_CARE_PROVIDER_SITE_OTHER): Payer: Medicaid Other | Admitting: Women's Health

## 2019-08-05 ENCOUNTER — Encounter (HOSPITAL_COMMUNITY): Payer: Self-pay | Admitting: *Deleted

## 2019-08-05 ENCOUNTER — Telehealth (HOSPITAL_COMMUNITY): Payer: Self-pay | Admitting: *Deleted

## 2019-08-05 DIAGNOSIS — Z3A39 39 weeks gestation of pregnancy: Secondary | ICD-10-CM

## 2019-08-05 DIAGNOSIS — Z3480 Encounter for supervision of other normal pregnancy, unspecified trimester: Secondary | ICD-10-CM

## 2019-08-05 DIAGNOSIS — Z3483 Encounter for supervision of other normal pregnancy, third trimester: Secondary | ICD-10-CM

## 2019-08-05 NOTE — Progress Notes (Signed)
I connected with@ on 08/05/19 at  2:35 PM EDT by: MyChart Video and verified that I am speaking with the correct person using two identifiers.  Patient is located at home and provider is located at [facility].     The purpose of this virtual visit is to provide medical care while limiting exposure to the novel coronavirus. I discussed the limitations, risks, security and privacy concerns of performing an evaluation and management service by MyChart Video and the availability of in person appointments. I also discussed with the patient that there may be a patient responsible charge related to this service. By engaging in this virtual visit, you consent to the provision of healthcare.  Additionally, you authorize for your insurance to be billed for the services provided during this visit.  The patient expressed understanding and agreed to proceed.  The following staff members participated in the virtual visit:  Anibal Henderson, PA-S.    PRENATAL VISIT NOTE  Subjective:  Alicia Dunlap is a 28 y.o. G2P1001 at [redacted]w[redacted]d  for phone visit for ongoing prenatal care.  She is currently monitored for the following issues for this low-risk pregnancy and has Supervision of other normal pregnancy, antepartum; Sickle cell trait ([place]); and Herpes infection on their problem list.  Patient reports pelvic pressure.  Contractions: Irritability. Vag. Bleeding: None.  Movement: Present. Denies leaking of fluid. Pt reports is taking Valtrex for suppressive therapy for HSV.  The following portions of the patient's history were reviewed and updated as appropriate: allergies, current medications, past family history, past medical history, past social history, past surgical history and problem list.   Objective:  There were no vitals filed for this visit. Self-Obtained B/P:138/86 P:88 -pt denies BP >140/90 at home, but has two recorded elevated BPs in chart >140/90 on 05/14/2019 and 05/22/2019, pt has not had any elevated  pressures since - pt reports she woke up with a HA this morning, pt did not take any medication for HA and reports that it resolves -called and spoke with Dr. Rip Harbour and asked about direct admission for gHTN, he reports she does not need to be direct admitted for gHTN and can go to MAU to be evaluated if there are any concerns  Fetal Status:     Movement: Present     Assessment and Plan:  Pregnancy: G2P1001 at [redacted]w[redacted]d  1. Supervision of other normal pregnancy, antepartum - [place] MFM FETAL BPP WO NON STRESS; Future - pt reminded of IOL scheduled for 08/13/2019, and educated on current procedure and COVID testing - pt reminded of NST scheduled for 08/08/2019, informed will schedule BPP at same time - pt declines contraception at this time - pt to take BP twice day, present to MAU if >/= 140/90 - s/sx of preeclampsia discussed  Term labor symptoms and general obstetric precautions including but not limited to vaginal bleeding, contractions, leaking of fluid and fetal movement were reviewed in detail with the patient.  Return in about 1 week (around 08/12/2019) for in-person ROB, NST @ [place] office, BPP @MFM  same day; pt also needs BPP on Friday 08/08/2019.  Future Appointments  Date Time Provider [place]  08/07/2019  1:30 PM [facility] [place] 1 [facility]US [facility]  08/08/2019  9:15 AM [facility] NURSE [facility] None  08/13/2019  6:30 AM [facility] SCHED ROOM [facility] None   Time spent on virtual visit: 25 minutes  Clarisa Fling, NP

## 2019-08-05 NOTE — Progress Notes (Signed)
ROB pt has NST only appt scheduled for 08/08/19 in office.   Induction 08/13/19  Pt checked B/p while on phone   B/P:138/86 P:88  CC: HA this morning. No visual changes

## 2019-08-05 NOTE — Patient Instructions (Addendum)
AREA PEDIATRIC/FAMILY PRACTICE PHYSICIANS  [facility] [address] [place], [place] [address] Phone - [phone]   Fax - [phone]  JACK AMOS [address] [place], [place]  [address] Phone - [phone]   Fax - [phone]  [facility] [address] [place], [place]  [address] Phone - [phone]   Fax - [phone]  [facility] [address] [place], [place]  [address] Phone - [phone]   Fax - [phone]  [facility] [address] [place], [place]  [address] Phone - [phone]   Fax - [phone]  [facility] [address] [place], [place]  [address] Phone - [phone]   Fax - [phone]  [facility] [address] [place], [place]  [address] Phone - [phone]   Fax - [phone]  [facility] [address] [place], [address] [place], [place]  [address] Phone - [phone]   Fax - [phone]  [facility] [address] [place], [place]  [address] Phone - [phone]   Fax - [phone] [facility] [place] [address] [place], [place]  [address] Phone - [phone]   Fax - [phone]  [facility] [address] [place], [place]  [address] Phone - [phone]   Fax - [phone]  [facility] [address] [place], [place]  [address] Phone - [phone]   Fax - [phone]  [facility] [place] [address] [place], [place]  [address] Phone - [phone]   Fax - [phone]  [facility] [address], Suite [place], [place]  [address] Phone - [phone]  [facility] [address] [place], [place]  [address] Phone - [phone]   Fax - [phone]  [facility] [address] [place], [place]  [address] Phone - [phone]   Fax - [phone]  [facility] [address] [place], [place]  [address] Phone - [phone]   Fax - [phone]  [facility] [address] [place], [place]  [address] Phone - [phone]   Fax - [phone]   [facility] [address] [place], [place]  [address] Phone - [phone]   Fax - [phone]  [facility] [address] [place], [place]  [address] Phone - [phone]   Fax - [phone]  DAVID RUBIN [address] [place], [place]  [address] Phone - [phone]   Fax - [phone]  [facility] [address] [place], [place]  [address] Phone - [phone]   Fax - [phone]  [place] [facility] Alita Chyle [address] [place], [place]  [address] Phone - [phone]   Fax - [phone]  Gerarda Fraction [address] [place], [place]  [address] Phone - [phone]   Fax - [phone]  [facility] [address] [place], [place]  [address] Phone - [phone]   Fax - [phone]  [facility] [place] [address] [place], [place]  [address] Phone - [phone]   Fax - [phone]    Preeclampsia and Eclampsia Preeclampsia is a serious condition that may develop during pregnancy. This condition causes high blood pressure and increased protein in your urine along with other symptoms,  such as headaches and vision changes. These symptoms may develop as the condition gets worse. Preeclampsia may occur at 20 weeks of pregnancy or later. Diagnosing and treating preeclampsia early is very important. If not treated early, it can cause serious problems for you and your baby. One problem it can lead to is eclampsia. Eclampsia is a condition that causes muscle jerking or shaking (convulsions or seizures) and  other serious problems for the mother. During pregnancy, delivering your baby may be the best treatment for preeclampsia or eclampsia. For most women, preeclampsia and eclampsia symptoms go away after giving birth. In rare cases, a woman may develop preeclampsia after giving birth (postpartum preeclampsia). This usually occurs within 48 hours after childbirth but may occur up to 6 weeks after giving birth. What are the causes? The cause of preeclampsia is not known. What increases the risk? The following risk factors make you more likely to develop preeclampsia:  Being pregnant for the first time.  Having had preeclampsia during a past pregnancy.  Having a family history of preeclampsia.  Having high blood pressure.  Being pregnant with more than one baby.  Being 65 or older.  Being African-American.  Having kidney disease or diabetes.  Having medical conditions such as lupus or blood diseases.  Being very overweight (obese). What are the signs or symptoms? The most common symptoms are:  Severe headaches.  Vision problems, such as blurred or double vision.  Abdominal pain, especially upper abdominal pain. Other symptoms that may develop as the condition gets worse include:  Sudden weight gain.  Sudden swelling of the hands, face, legs, and feet.  Severe nausea and vomiting.  Numbness in the face, arms, legs, and feet.  Dizziness.  Urinating less than usual.  Slurred speech.  Convulsions or seizures. How is this diagnosed? There are no screening tests for preeclampsia. Your health care provider will ask you about symptoms and check for signs of preeclampsia during your prenatal visits. You may also have tests that include:  Checking your blood pressure.  Urine tests to check for protein. Your health care provider will check for this at every prenatal visit.  Blood tests.  Monitoring your baby's heart rate.  Ultrasound. How is this treated? You and your  health care provider will determine the treatment approach that is best for you. Treatment may include:  Having more frequent prenatal exams to check for signs of preeclampsia, if you have an increased risk for preeclampsia.  Medicine to lower your blood pressure.  Staying in the hospital, if your condition is severe. There, treatment will focus on controlling your blood pressure and the amount of fluids in your body (fluid retention).  Taking medicine (magnesium sulfate) to prevent seizures. This may be given as an injection or through an IV.  Taking a low-dose aspirin during your pregnancy.  Delivering your baby early. You may have your labor started with medicine (induced), or you may have a cesarean delivery. Follow these instructions at home: Eating and drinking   Drink enough fluid to keep your urine pale yellow.  Avoid caffeine. Lifestyle  Do not use any products that contain nicotine or tobacco, such as cigarettes and e-cigarettes. If you need help quitting, ask your health care provider.  Do not use alcohol or drugs.  Avoid stress as much as possible. Rest and get plenty of sleep. General instructions  Take over-the-counter and prescription medicines only as told by your health care provider.  When lying down, lie on your left side.  This keeps pressure off your major blood vessels.  When sitting or lying down, raise (elevate) your feet. Try putting some pillows underneath your lower legs.  Exercise regularly. Ask your health care provider what kinds of exercise are best for you.  Keep all follow-up and prenatal visits as told by your health care provider. This is important. How is this prevented? There is no known way of preventing preeclampsia or eclampsia from developing. However, to lower your risk of complications and detect problems early:  Get regular prenatal care. Your health care provider may be able to diagnose and treat the condition early.  Maintain a  healthy weight. Ask your health care provider for help managing weight gain during pregnancy.  Work with your health care provider to manage any long-term (chronic) health conditions you have, such as diabetes or kidney problems.  You may have tests of your blood pressure and kidney function after giving birth.  Your health care provider may have you take low-dose aspirin during your next pregnancy. Contact a health care provider if:  You have symptoms that your health care provider told you may require more treatment or monitoring, such as: ? Headaches. ? Nausea or vomiting. ? Abdominal pain. ? Dizziness. ? Light-headedness. Get help right away if:  You have severe: ? Abdominal pain. ? Headaches that do not get better. ? Dizziness. ? Vision problems. ? Confusion. ? Nausea or vomiting.  You have any of the following: ? A seizure. ? Sudden, rapid weight gain. ? Sudden swelling in your hands, ankles, or face. ? Trouble moving any part of your body. ? Numbness in any part of your body. ? Trouble speaking. ? Abnormal bleeding.  You faint. Summary  Preeclampsia is a serious condition that may develop during pregnancy.  This condition causes high blood pressure and increased protein in your urine along with other symptoms, such as headaches and vision changes.  Diagnosing and treating preeclampsia early is very important. If not treated early, it can cause serious problems for you and your baby.  Get help right away if you have symptoms that your health care provider told you to watch for. This information is not intended to replace advice given to you by your health care provider. Make sure you discuss any questions you have with your health care provider. Document Released: 10/27/2000 Document Revised: 07/02/2018 Document Reviewed: 06/05/2016 Elsevier Patient Education  2020 [employer].

## 2019-08-05 NOTE — Telephone Encounter (Signed)
Preadmission screen  

## 2019-08-06 ENCOUNTER — Other Ambulatory Visit: Payer: Self-pay | Admitting: Advanced Practice Midwife

## 2019-08-07 ENCOUNTER — Other Ambulatory Visit: Payer: Self-pay

## 2019-08-07 ENCOUNTER — Ambulatory Visit (HOSPITAL_COMMUNITY): Payer: Medicaid Other

## 2019-08-07 ENCOUNTER — Ambulatory Visit (HOSPITAL_COMMUNITY)
Admission: RE | Admit: 2019-08-07 | Discharge: 2019-08-07 | Disposition: A | Discharge status: Home / Self Care | Payer: Medicaid Other | Source: Ambulatory Visit | Attending: Obstetrics and Gynecology | Admitting: Obstetrics and Gynecology

## 2019-08-07 DIAGNOSIS — O480 Post-term pregnancy: Secondary | ICD-10-CM | POA: Diagnosis not present

## 2019-08-07 DIAGNOSIS — Z3480 Encounter for supervision of other normal pregnancy, unspecified trimester: Secondary | ICD-10-CM | POA: Diagnosis present

## 2019-08-07 DIAGNOSIS — Z3A40 40 weeks gestation of pregnancy: Secondary | ICD-10-CM | POA: Diagnosis not present

## 2019-08-08 ENCOUNTER — Other Ambulatory Visit: Payer: Self-pay

## 2019-08-08 ENCOUNTER — Ambulatory Visit (INDEPENDENT_AMBULATORY_CARE_PROVIDER_SITE_OTHER): Payer: Medicaid Other

## 2019-08-08 DIAGNOSIS — O480 Post-term pregnancy: Secondary | ICD-10-CM | POA: Diagnosis not present

## 2019-08-08 NOTE — Progress Notes (Signed)
Pt presents for NST for postdate pregnancy. Baseline 135, 15x15 NST reviewed and signed by Dr. Jodi Mourning

## 2019-08-11 ENCOUNTER — Other Ambulatory Visit (HOSPITAL_COMMUNITY)
Admission: RE | Admit: 2019-08-11 | Discharge: 2019-08-11 | Disposition: A | Discharge status: Home / Self Care | Payer: Medicaid Other | Source: Ambulatory Visit | Attending: Obstetrics & Gynecology | Admitting: Obstetrics & Gynecology

## 2019-08-11 ENCOUNTER — Other Ambulatory Visit: Payer: Self-pay

## 2019-08-11 DIAGNOSIS — Z01812 Encounter for preprocedural laboratory examination: Secondary | ICD-10-CM | POA: Diagnosis present

## 2019-08-11 DIAGNOSIS — Z20828 Contact with and (suspected) exposure to other viral communicable diseases: Secondary | ICD-10-CM | POA: Diagnosis not present

## 2019-08-11 LAB — SARS CORONAVIRUS 2 (TAT 6-24 HRS): SARS Coronavirus 2: NEGATIVE

## 2019-08-11 NOTE — MAU Note (Signed)
Asymptomatic, swab collected. 

## 2019-08-13 ENCOUNTER — Inpatient Hospital Stay (HOSPITAL_COMMUNITY): Payer: Medicaid Other

## 2019-08-13 ENCOUNTER — Encounter (HOSPITAL_COMMUNITY): Payer: Self-pay

## 2019-08-13 ENCOUNTER — Other Ambulatory Visit: Payer: Self-pay

## 2019-08-13 ENCOUNTER — Inpatient Hospital Stay (HOSPITAL_COMMUNITY)
Admission: AD | Admit: 2019-08-13 | Discharge: 2019-08-15 | DRG: 806 | Disposition: A | Discharge status: Home / Self Care | Payer: Medicaid Other | Attending: Obstetrics and Gynecology | Admitting: Obstetrics and Gynecology

## 2019-08-13 DIAGNOSIS — Z3A41 41 weeks gestation of pregnancy: Secondary | ICD-10-CM

## 2019-08-13 DIAGNOSIS — O9902 Anemia complicating childbirth: Secondary | ICD-10-CM | POA: Diagnosis present

## 2019-08-13 DIAGNOSIS — A6000 Herpesviral infection of urogenital system, unspecified: Secondary | ICD-10-CM | POA: Diagnosis present

## 2019-08-13 DIAGNOSIS — D573 Sickle-cell trait: Secondary | ICD-10-CM | POA: Diagnosis present

## 2019-08-13 DIAGNOSIS — O480 Post-term pregnancy: Secondary | ICD-10-CM | POA: Diagnosis present

## 2019-08-13 DIAGNOSIS — B009 Herpesviral infection, unspecified: Secondary | ICD-10-CM | POA: Diagnosis present

## 2019-08-13 DIAGNOSIS — O9832 Other infections with a predominantly sexual mode of transmission complicating childbirth: Secondary | ICD-10-CM | POA: Diagnosis present

## 2019-08-13 DIAGNOSIS — Z87891 Personal history of nicotine dependence: Secondary | ICD-10-CM | POA: Diagnosis not present

## 2019-08-13 LAB — TYPE AND SCREEN
ABO/RH(D): A POS
Antibody Screen: NEGATIVE

## 2019-08-13 LAB — ABO/RH: ABO/RH(D): A POS

## 2019-08-13 LAB — CBC
HCT: 37.4 % (ref 36.0–46.0)
Hemoglobin: 12.9 g/dL (ref 12.0–15.0)
MCH: 29 pg (ref 26.0–34.0)
MCHC: 34.5 g/dL (ref 30.0–36.0)
MCV: 84 fL (ref 80.0–100.0)
Platelets: 281 10*3/uL (ref 150–400)
RBC: 4.45 MIL/uL (ref 3.87–5.11)
RDW: 12.8 % (ref 11.5–15.5)
WBC: 13.3 10*3/uL — ABNORMAL HIGH (ref 4.0–10.5)
nRBC: 0 % (ref 0.0–0.2)

## 2019-08-13 MED ORDER — FENTANYL-BUPIVACAINE-NACL 0.5-0.125-0.9 MG/250ML-% EP SOLN
12.0000 mL/h | EPIDURAL | Status: DC | PRN
  Filled 2019-08-13: qty 250

## 2019-08-13 MED ORDER — EPHEDRINE 5 MG/ML INJ: 10.0000 mg | INTRAVENOUS | Status: DC | PRN

## 2019-08-13 MED ORDER — MISOPROSTOL 25 MCG QUARTER TABLET
25.0000 ug | ORAL_TABLET | ORAL | Status: DC | PRN
  Administered 2019-08-13: 25 ug via VAGINAL
  Filled 2019-08-13: qty 1

## 2019-08-13 MED ORDER — PHENYLEPHRINE 40 MCG/ML (10ML) SYRINGE FOR IV PUSH (FOR BLOOD PRESSURE SUPPORT): 80.0000 ug | PREFILLED_SYRINGE | INTRAVENOUS | Status: DC | PRN

## 2019-08-13 MED ORDER — LACTATED RINGERS IV SOLN: 500.0000 mL | INTRAVENOUS | Status: DC | PRN

## 2019-08-13 MED ORDER — OXYCODONE-ACETAMINOPHEN 5-325 MG PO TABS: 2.0000 | ORAL_TABLET | ORAL | Status: DC | PRN

## 2019-08-13 MED ORDER — ONDANSETRON HCL 4 MG/2ML IJ SOLN: 4.0000 mg | Freq: Four times a day (QID) | INTRAMUSCULAR | Status: DC | PRN

## 2019-08-13 MED ORDER — OXYCODONE-ACETAMINOPHEN 5-325 MG PO TABS: 1.0000 | ORAL_TABLET | ORAL | Status: DC | PRN

## 2019-08-13 MED ORDER — SOD CITRATE-CITRIC ACID 500-334 MG/5ML PO SOLN: 30.0000 mL | ORAL | Status: DC | PRN

## 2019-08-13 MED ORDER — OXYTOCIN BOLUS FROM INFUSION
500.0000 mL | Freq: Once | INTRAVENOUS | Status: AC
  Administered 2019-08-14: 500 mL via INTRAVENOUS

## 2019-08-13 MED ORDER — ACETAMINOPHEN 325 MG PO TABS: 650.0000 mg | ORAL_TABLET | ORAL | Status: DC | PRN

## 2019-08-13 MED ORDER — OXYTOCIN 40 UNITS IN NORMAL SALINE INFUSION - SIMPLE MED
1.0000 m[IU]/min | INTRAVENOUS | Status: DC
  Administered 2019-08-13 – 2019-08-14 (×2): 2 m[IU]/min via INTRAVENOUS

## 2019-08-13 MED ORDER — OXYTOCIN 40 UNITS IN NORMAL SALINE INFUSION - SIMPLE MED
2.5000 [IU]/h | INTRAVENOUS | Status: DC
  Administered 2019-08-14: 2.5 [IU]/h via INTRAVENOUS
  Filled 2019-08-13: qty 1000

## 2019-08-13 MED ORDER — PHENYLEPHRINE 40 MCG/ML (10ML) SYRINGE FOR IV PUSH (FOR BLOOD PRESSURE SUPPORT)
80.0000 ug | PREFILLED_SYRINGE | INTRAVENOUS | Status: DC | PRN
  Filled 2019-08-13: qty 10

## 2019-08-13 MED ORDER — LIDOCAINE HCL (PF) 1 % IJ SOLN: 30.0000 mL | INTRAMUSCULAR | Status: DC | PRN

## 2019-08-13 MED ORDER — LACTATED RINGERS IV SOLN
INTRAVENOUS | Status: DC
  Administered 2019-08-13 – 2019-08-14 (×3): via INTRAVENOUS

## 2019-08-13 MED ORDER — LACTATED RINGERS IV SOLN
500.0000 mL | Freq: Once | INTRAVENOUS | Status: AC
  Administered 2019-08-13: 23:00:00 500 mL via INTRAVENOUS

## 2019-08-13 MED ORDER — TERBUTALINE SULFATE 1 MG/ML IJ SOLN
0.2500 mg | Freq: Once | INTRAMUSCULAR | Status: AC | PRN
  Administered 2019-08-14: 0.25 mg via SUBCUTANEOUS
  Filled 2019-08-13: qty 1

## 2019-08-13 MED ORDER — DIPHENHYDRAMINE HCL 50 MG/ML IJ SOLN: 12.5000 mg | INTRAMUSCULAR | Status: DC | PRN

## 2019-08-13 NOTE — Progress Notes (Signed)
08/13/2019 - 11:59 PM  28 y.o. G2P1001 [redacted]w[redacted]d   Pregnancy uncomplicated   Patient Active Problem List   Diagnosis Date Noted  . Post term pregnancy at [redacted] weeks gestation 08/13/2019  . Herpes infection 03/20/2019  . Supervision of other normal pregnancy, antepartum 01/05/2019  . Sickle cell trait ([place]) 01/05/2019    [name] is admitted for IOL for post-dates   Subjective:  Doing well, feeling contractions. Called by RN over some repetitive lates and variables to bedside.  Objective:   Vitals:   08/13/19 2201 08/13/19 2230 08/13/19 2300 08/13/19 2330  BP: (!) 106/59 (!) 110/55 106/60 115/61  Pulse: 90 87 96 93  Resp: 18 18 18 18   Temp:      TempSrc:      Weight:      Height:        Current Vital Signs 24h Vital Sign Ranges  T 98.9 F (37.2 C) Temp  Avg: 98.4 F (36.9 C)  Min: 97.9 F (36.6 C)  Max: 98.9 F (37.2 C)  BP 115/61 BP  Min: 106/60  Max: 140/73  HR 93 Pulse  Avg: 93.7  Min: 87  Max: 107  RR 18 Resp  Avg: 18  Min: 18  Max: 18  SaO2     No data recorded       24 Hour I/O Current Shift I/O  Time Ins Outs No intake/output data recorded. No intake/output data recorded.   FHR: 140 baseline, good variability, yes accels, variables, lates  Toco: q 2-5 minutes SVE: 5/70/-2   Patient repositioned, decreased pitocin to 2u/hour, AROM with clear fluid and placement of IUPC with improvement in FHT, will consider amnioinfusion if continued   Assessment & Plan:  FHT CAT 2, improved with interventions GBS: neg Pitocin dec to 2 Analgesia: planning for epidural

## 2019-08-13 NOTE — H&P (Signed)
LABOR AND DELIVERY ADMISSION HISTORY AND PHYSICAL NOTE  Alicia Dunlap is a 28 y.o. female G2P1001 with IUP at 36w0dby LMP presenting for IOL for PD. She reports positive fetal movement. She denies leakage of fluid or vaginal bleeding.  Prenatal History/Complications: PNC at [facility] Pregnancy complications:  - HSV  - sickle cell trait   Past Medical History: Past Medical History:  Diagnosis Date  . Bacterial vaginosis   . DENTAL CARIES 08/11/2010   Qualifier: Diagnosis of  By: JMartiniqueMD, Sarah    . Drug abuse, marijuana 09/06/2012  . High risk sexual behavior 02/12/2013   Multiple STD's checks: 8 since 2011.   . Tobacco abuse 06/11/2017    Past Surgical History: Past Surgical History:  Procedure Laterality Date  . NO PAST SURGERIES      Obstetrical History: OB History    Gravida  2   Para  1   Term  1   Preterm      AB      Living  1     SAB      TAB      Ectopic      Multiple      Live Births  1           Social History: Social History   Socioeconomic History  . Marital status: Single    Spouse name: Not on file  . Number of children: Not on file  . Years of education: Not on file  . Highest education level: Not on file  Occupational History  . Not on file  Social Needs  . Financial resource strain: Not hard at all  . Food insecurity    Worry: Never true    Inability: Never true  . Transportation needs    Medical: No    Non-medical: No  Tobacco Use  . Smoking status: Former Smoker    Types: Cigarettes  . Smokeless tobacco: Never Used  Substance and Sexual Activity  . Alcohol use: Not Currently  . Drug use: Not Currently  . Sexual activity: Yes    Birth control/protection: None  Lifestyle  . Physical activity    Days per week: Not on file    Minutes per session: Not on file  . Stress: Not on file  Relationships  . Social [place] phone: Not on file    Gets together: Not on file    Attends religious service: Not  on file    Active member of club or organization: Not on file    Attends meetings of clubs or organizations: Not on file    Relationship status: Not on file  Other Topics Concern  . Not on file  Social History Narrative   ** Merged History Encounter **        Family History: History reviewed. No pertinent family history.  Allergies: Allergies  Allergen Reactions  . Amoxicillin Rash    Has patient had a PCN reaction causing immediate rash, facial/tongue/throat swelling, SOB or lightheadedness with hypotension: no Has patient had a PCN reaction causing severe rash involving mucus membranes or skin necrosis: no Has patient had a PCN reaction that required hospitalization no Has patient had a PCN reaction occurring within the last 10 years: yes If all of the above answers are "NO", then may proceed with Cephalosporin use.     Medications Prior to Admission  Medication Sig Dispense Refill Last Dose  . Prenatal Vit-Fe Fumarate-FA (PRENATAL MULTIVITAMIN) TABS tablet  Take 1 tablet by mouth daily at 12 noon.   08/12/2019 at Unknown time  . valACYclovir (VALTREX) 500 MG tablet Take 1 tablet (500 mg total) by mouth 2 (two) times daily. 60 tablet 6 08/13/2019 at Unknown time  . Blood Pressure KIT Check blood pressure at home regularly size large z34.80 1 kit 0   . calcium carbonate (TUMS - DOSED IN MG ELEMENTAL CALCIUM) 500 MG chewable tablet Chew 1 tablet (200 mg of elemental calcium total) by mouth daily. 30 tablet 0   . cyclobenzaprine (FLEXERIL) 10 MG tablet Take 1 tablet (10 mg total) by mouth 3 (three) times daily as needed for up to 30 doses for muscle spasms. (Patient not taking: Reported on 07/07/2019) 30 tablet 0   . Elastic Bandages & Supports (COMFORT FIT MATERNITY SUPP LG) MISC One maternity support belt to fit client (Patient not taking: Reported on 08/05/2019) 1 each 0   . famotidine (PEPCID) 20 MG tablet Take 1 tablet (20 mg total) by mouth 2 (two) times daily. (Patient not taking:  Reported on 07/07/2019) 60 tablet 2   . Prenat w/o A-FeCbn-Meth-FA-DHA (PRENATE MINI) 29-0.6-0.4-350 MG CAPS Take 1 capsule by mouth daily before breakfast. 90 capsule 4      Review of Systems  All systems reviewed and negative except as stated in HPI  Physical Exam Blood pressure 131/87, pulse (!) 107, temperature 97.9 F (36.6 C), temperature source Oral, resp. rate 18, height 5' 7"  (1.702 m), weight 113.3 kg, last menstrual period 10/30/2018. General appearance: alert, cooperative and no distress Lungs: clear to auscultation bilaterally Heart: regular rate and rhythm Abdomen: soft, non-tender; bowel sounds normal Extremities: No calf swelling or tenderness Presentation: cephalic Fetal monitoring: 135/moderate/+accels/ no decels  Uterine activity: occasional UC/mild by palpation  Dilation: 2 Effacement (%): 60 Station: -2 Exam by:: [place] CNM  Prenatal labs: ABO, Rh: --/--/A POS (09/30 1622) Antibody: NEG (09/30 1622) Rubella: 1.11 (03/12 1610) RPR: Non Reactive (07/01 0835)  HBsAg: Negative (03/12 1610)  HIV: Non Reactive (07/01 0835)  GC/Chlamydia: negative GBS: --Henderson Cloud (08/24 0407)  2 hr Glucola: 78-105-95 Genetic screening:  Low risk  Anatomy US: normal   Nursing Staff Provider  Office Location  [facility]  Dating  LMP and 9 week [place]  Language  English  Anatomy US  19 week nl, anatomy, complete  Flu Vaccine  Declined Genetic Screen  NIPS: low risk  AFP:   First Screen:  Quad:  Declined   TDaP vaccine   Declined 07-07-19 Hgb A1C or  GTT Early  Third trimester normal 05/14/2019  Rhogam   n/a   LAB RESULTS   Feeding Plan  Breastfeed Blood Type A/Positive/-- (03/12 1610) A positive   Contraception  None Antibody Negative (03/12 1610)negative   Circumcision  yes Rubella 1.11 (03/12 1610)immune   Pediatrician   undecided, list given 08-05-19 RPR Non Reactive (07/01 0835) neg  Support Person  FOB-Billy HBsAg Negative (03/12 1610) neg   Prenatal Classes  no HIV Non Reactive  (07/01 0835)neg  BTL Consent  GBS --Henderson Cloud (08/24 0407)(negative  VBAC Consent  Pap 01/2019 normal    Hgb Electro  Sickle cell trait     CF negative    SMA negative    Waterbirth  [ ]  Class [ ]  Consent [ ]  CNM visit   Prenatal Transfer Tool  Maternal Diabetes: No Genetic Screening: Normal Maternal Ultrasounds/Referrals: Normal Fetal Ultrasounds or other Referrals:  None Maternal Substance Abuse:  No Significant Maternal Medications:  Meds include:  Other: Valtrex Significant Maternal Lab Results: Group B Strep negative and Other: HSV pos  Results for orders placed or performed during the hospital encounter of 08/13/19 (from the past 24 hour(s))  Type and screen   Collection Time: 08/13/19  4:22 PM  Result Value Ref Range   ABO/RH(D) A POS    Antibody Screen NEG    Sample Expiration      08/16/2019,2359 Performed at [facility], [place] [address], [place], [place] [address]   CBC   Collection Time: 08/13/19  4:30 PM  Result Value Ref Range   WBC 13.3 (H) 4.0 - 10.5 K/uL   RBC 4.45 3.87 - 5.11 MIL/uL   Hemoglobin 12.9 12.0 - 15.0 g/dL   HCT 37.4 36.0 - 46.0 %   MCV 84.0 80.0 - 100.0 fL   MCH 29.0 26.0 - 34.0 pg   MCHC 34.5 30.0 - 36.0 g/dL   RDW 12.8 11.5 - 15.5 %   Platelets 281 150 - 400 K/uL   nRBC 0.0 0.0 - 0.2 %    Patient Active Problem List   Diagnosis Date Noted  . Post term pregnancy at [redacted] weeks gestation 08/13/2019  . Herpes infection 03/20/2019  . Supervision of other normal pregnancy, antepartum 01/05/2019  . Sickle cell trait ([place]) 01/05/2019    Assessment: KARESHA TRZCINSKI is a 28 y.o. 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

## 2019-08-13 NOTE — Progress Notes (Signed)
08/13/2019 - 8:58 PM  28 y.o. G2P1001 [redacted]w[redacted]d   Pregnancy complicated by post dates, hx of HSV  Patient Active Problem List   Diagnosis Date Noted  . Post term pregnancy at [redacted] weeks gestation 08/13/2019  . Herpes infection 03/20/2019  . Supervision of other normal pregnancy, antepartum 01/05/2019  . Sickle cell trait ([place]) 01/05/2019    [name] is admitted for  IOL for post-dates   Subjective:  Doing well, feeling regular cramps, 6/10. Objective:   Vitals:   08/13/19 1715 08/13/19 1835 08/13/19 1922 08/13/19 2045  BP: 140/89 140/73 138/77 137/88  Pulse: 93 89 93 95  Resp: 18 18 18 18   Temp:   98.9 F (37.2 C)   TempSrc:   Oral   Weight:      Height:        Current Vital Signs 24h Vital Sign Ranges  T 98.9 F (37.2 C) Temp  Avg: 98.4 F (36.9 C)  Min: 97.9 F (36.6 C)  Max: 98.9 F (37.2 C)  BP 137/88 BP  Min: 131/87  Max: 140/73  HR 95 Pulse  Avg: 95.4  Min: 89  Max: 107  RR 18 Resp  Avg: 18  Min: 18  Max: 18  SaO2     No data recorded       24 Hour I/O Current Shift I/O  Time Ins Outs No intake/output data recorded. No intake/output data recorded.   FHR:  130 baseline, good variability, + accels, some inter late appearing decels, resolved with repositioning  Toco: q5-7 SVE: will check prior to pit   Assessment & Plan:  FHT CAT 2, but improved to cat 1 with repositioning GBS: neg Pitocin will get 20 mins of cat 1 strip with repositioning and plan to start pitocin at low dose protocol Analgesia: none currently

## 2019-08-14 ENCOUNTER — Inpatient Hospital Stay (HOSPITAL_COMMUNITY): Payer: Medicaid Other | Admitting: Anesthesiology

## 2019-08-14 ENCOUNTER — Encounter (HOSPITAL_COMMUNITY): Payer: Self-pay

## 2019-08-14 DIAGNOSIS — O480 Post-term pregnancy: Secondary | ICD-10-CM

## 2019-08-14 DIAGNOSIS — Z3A41 41 weeks gestation of pregnancy: Secondary | ICD-10-CM

## 2019-08-14 LAB — RPR: RPR Ser Ql: NONREACTIVE

## 2019-08-14 MED ORDER — COCONUT OIL OIL: 1.0000 "application " | TOPICAL_OIL | Status: DC | PRN

## 2019-08-14 MED ORDER — DIBUCAINE (PERIANAL) 1 % EX OINT: 1.0000 "application " | TOPICAL_OINTMENT | CUTANEOUS | Status: DC | PRN

## 2019-08-14 MED ORDER — DIPHENHYDRAMINE HCL 25 MG PO CAPS: 25.0000 mg | ORAL_CAPSULE | Freq: Four times a day (QID) | ORAL | Status: DC | PRN

## 2019-08-14 MED ORDER — ACETAMINOPHEN 325 MG PO TABS
650.0000 mg | ORAL_TABLET | ORAL | Status: DC | PRN
  Administered 2019-08-14 – 2019-08-15 (×3): 650 mg via ORAL
  Filled 2019-08-14 (×3): qty 2

## 2019-08-14 MED ORDER — TETANUS-DIPHTH-ACELL PERTUSSIS 5-2.5-18.5 LF-MCG/0.5 IM SUSP: 0.5000 mL | Freq: Once | INTRAMUSCULAR | Status: DC

## 2019-08-14 MED ORDER — SIMETHICONE 80 MG PO CHEW: 80.0000 mg | CHEWABLE_TABLET | ORAL | Status: DC | PRN

## 2019-08-14 MED ORDER — LIDOCAINE HCL (PF) 1 % IJ SOLN
INTRAMUSCULAR | Status: DC | PRN
  Administered 2019-08-14 (×2): 4 mL via EPIDURAL

## 2019-08-14 MED ORDER — SENNOSIDES-DOCUSATE SODIUM 8.6-50 MG PO TABS
2.0000 | ORAL_TABLET | ORAL | Status: DC
  Administered 2019-08-14: 2 via ORAL
  Filled 2019-08-14: qty 2

## 2019-08-14 MED ORDER — SODIUM CHLORIDE (PF) 0.9 % IJ SOLN
INTRAMUSCULAR | Status: DC | PRN
  Administered 2019-08-14: 12 mL/h via EPIDURAL

## 2019-08-14 MED ORDER — FENTANYL-BUPIVACAINE-NACL 0.5-0.125-0.9 MG/250ML-% EP SOLN: 12.0000 mL/h | EPIDURAL | Status: DC | PRN

## 2019-08-14 MED ORDER — ZOLPIDEM TARTRATE 5 MG PO TABS: 5.0000 mg | ORAL_TABLET | Freq: Every evening | ORAL | Status: DC | PRN

## 2019-08-14 MED ORDER — WITCH HAZEL-GLYCERIN EX PADS: 1.0000 "application " | MEDICATED_PAD | CUTANEOUS | Status: DC | PRN

## 2019-08-14 MED ORDER — ONDANSETRON HCL 4 MG PO TABS: 4.0000 mg | ORAL_TABLET | ORAL | Status: DC | PRN

## 2019-08-14 MED ORDER — PRENATAL MULTIVITAMIN CH
1.0000 | ORAL_TABLET | Freq: Every day | ORAL | Status: DC
  Administered 2019-08-14 – 2019-08-15 (×2): 1 via ORAL
  Filled 2019-08-14 (×2): qty 1

## 2019-08-14 MED ORDER — BENZOCAINE-MENTHOL 20-0.5 % EX AERO
1.0000 "application " | INHALATION_SPRAY | CUTANEOUS | Status: DC | PRN
  Administered 2019-08-15: 1 via TOPICAL
  Filled 2019-08-14: qty 56

## 2019-08-14 MED ORDER — ONDANSETRON HCL 4 MG/2ML IJ SOLN: 4.0000 mg | INTRAMUSCULAR | Status: DC | PRN

## 2019-08-14 MED ORDER — IBUPROFEN 600 MG PO TABS
600.0000 mg | ORAL_TABLET | Freq: Four times a day (QID) | ORAL | Status: DC
  Administered 2019-08-14 – 2019-08-15 (×5): 600 mg via ORAL
  Filled 2019-08-14 (×5): qty 1

## 2019-08-14 NOTE — Anesthesia Procedure Notes (Deleted)
Epidural

## 2019-08-14 NOTE — Discharge Summary (Signed)
Postpartum Discharge Summary       Patient Name: Alicia Dunlap DOB: [birth year] MRN: [id number]  Date of admission: 08/13/2019 Delivering Provider: Serita Grammes D   Date of discharge: 08/15/2019  Admitting diagnosis: PREG Intrauterine pregnancy: [redacted]w[redacted]d    Secondary diagnosis:  Active Problems:   Sickle cell trait ([place]   Herpes infection   Post term pregnancy at [redacted] weeks gestation  Additional problems: none     Discharge diagnosis: Term Pregnancy Delivered                                                                                                Post partum procedures:none  Augmentation: AROM, Pitocin, Cytotec and Foley Balloon  Complications: None  Hospital course:  Induction of Labor With Vaginal Delivery   28y.o. yo G2P1001 at 470w1das admitted to the hospital 08/13/2019 for induction of labor. No s/s HSV outbreak.  Indication for induction: Postdates.  Patient had an uncomplicated labor course as follows: Membrane Rupture Time/Date: 11:53 PM ,08/13/2019   Intrapartum Procedures: Episiotomy: None [1]                                         Lacerations:  1st degree [2]  Patient had delivery of a Viable infant.  Information for the patient's newborn:  MaAkshara, [place]0[[id number]]    08/14/2019  Details of delivery can be found in separate delivery note.  Patient had a routine postpartum course. Patient is discharged home 08/15/19. Delivery time: 6:02 AM    Magnesium Sulfate received: No BMZ received: No Rhophylac:N/A MMR:N/A Transfusion:No  Physical exam  Vitals:   08/14/19 1311 08/14/19 1811 08/14/19 2131 08/15/19 0600  BP: 119/88 132/83 136/88 129/87  Pulse: (!) 101 95 94 78  Resp: 18 20 18 18   Temp: 98.1 F (36.7 C) 98.6 F (37 C) 98.4 F (36.9 C) 98.3 F (36.8 C)  TempSrc: Oral Oral Oral   SpO2:   98% 98%  Weight:      Height:       General: alert, cooperative and no distress Lochia: appropriate Uterine Fundus: firm Incision:  N/A DVT Evaluation: No evidence of DVT seen on physical exam. Negative Homan's sign. No cords or calf tenderness. Labs: Lab Results  Component Value Date   WBC 13.3 (H) 08/13/2019   HGB 12.9 08/13/2019   HCT 37.4 08/13/2019   MCV 84.0 08/13/2019   PLT 281 08/13/2019   CMP Latest Ref Rng & Units 06/04/2019  Glucose 70 - 99 mg/dL 90  BUN 6 - 20 mg/dL <5(L)  Creatinine 0.44 - 1.00 mg/dL 0.57  Sodium 135 - 145 mmol/L 137  Potassium 3.5 - 5.1 mmol/L 3.7  Chloride 98 - 111 mmol/L 105  CO2 22 - 32 mmol/L 24  Calcium 8.9 - 10.3 mg/dL 8.6(L)  Total Protein 6.5 - 8.1 g/dL 6.6  Total Bilirubin 0.3 - 1.2 mg/dL 0.2(L)  Alkaline Phos 38 - 126 U/L 90  AST 15 - 41 U/L  32  ALT 0 - 44 U/L 36    Discharge instruction: per After Visit Summary and "Baby and Me Booklet".  After visit meds:  Allergies as of 08/15/2019      Reactions   Amoxicillin Rash   Has patient had a PCN reaction causing immediate rash, facial/tongue/throat swelling, SOB or lightheadedness with hypotension: no Has patient had a PCN reaction causing severe rash involving mucus membranes or skin necrosis: no Has patient had a PCN reaction that required hospitalization no Has patient had a PCN reaction occurring within the last 10 years: yes If all of the above answers are "NO", then may proceed with Cephalosporin use.      Medication List    STOP taking these medications   Blood Pressure Kit   calcium carbonate 500 MG chewable tablet Commonly known as: TUMS - dosed in mg elemental calcium   Comfort Fit Maternity Supp Lg Misc   cyclobenzaprine 10 MG tablet Commonly known as: FLEXERIL   famotidine 20 MG tablet Commonly known as: PEPCID   prenatal multivitamin Tabs tablet   valACYclovir 500 MG tablet Commonly known as: VALTREX     TAKE these medications   ibuprofen 600 MG tablet Commonly known as: ADVIL Take 1 tablet (600 mg total) by mouth every 6 (six) hours.   Prenate Mini 29-0.6-0.4-350 MG Caps Take 1  capsule by mouth daily before breakfast.       Diet: routine diet  Activity: Advance as tolerated. Pelvic rest for 6 weeks.   Outpatient follow up:4 weeks Follow up Appt: Future Appointments  Date Time Provider [place]  09/12/2019  9:15 AM Luvenia Redden, PA-C [facility] None   Follow up Visit: [place]. Schedule an appointment as soon as possible for a visit in 4 week(s).   Specialty: Obstetrics and Gynecology Contact information: [address], [place] [phone]          Please schedule this patient for Postpartum visit in: 4 weeks- can be virtual; with the following provider: Any provider For C/S patients schedule nurse incision check in weeks 2 weeks: no Low risk pregnancy complicated by: none Delivery mode:  SVD Anticipated Birth Control:  declines PP Procedures needed: none  Schedule Integrated BH visit: no    Newborn Data: Live born female  Birth Weight: 3455gm (7lb 9.9oz)  APGAR: 35, 9  Newborn Delivery   Birth date/time: 08/14/2019 06:02:00 Delivery type: Vaginal, Spontaneous      Baby Feeding: Breast Disposition:home with mother   08/15/2019 Christin Fudge, CNM

## 2019-08-14 NOTE — Progress Notes (Signed)
08/14/2019 - 3:43 AM  28 y.o. G2P1001 [redacted]w[redacted]d   Pregnancy complicated by none  Patient Active Problem List   Diagnosis Date Noted  . Post term pregnancy at [redacted] weeks gestation 08/13/2019  . Herpes infection 03/20/2019  . Supervision of other normal pregnancy, antepartum 01/05/2019  . Sickle cell trait ([place]) 01/05/2019    Ms. [name] is admitted for IOL for post-dates   Subjective:  Currently uncomfortable, does not feel that  Her epidural is working well currently Objective:   Vitals:   08/14/19 0200 08/14/19 0230 08/14/19 0300 08/14/19 0330  BP: (!) 112/56 (!) 112/55 116/66 127/66  Pulse: (!) 101 96 97 99  Resp: 18 20 18 18   Temp:  98.7 F (37.1 C)    TempSrc:  Axillary    Weight:      Height:        Current Vital Signs 24h Vital Sign Ranges  T 98.7 F (37.1 C) Temp  Avg: 98.6 F (37 C)  Min: 97.9 F (36.6 C)  Max: 98.9 F (37.2 C)  BP 127/66 BP  Min: 101/87  Max: 140/73  HR 99 Pulse  Avg: 104.1  Min: 85  Max: 191  RR 18 Resp  Avg: 18.2  Min: 18  Max: 20  SaO2     No data recorded       24 Hour I/O Current Shift I/O  Time Ins Outs 09/30 0701 - 10/01 0700 In: -  Out: 950 [Urine:950] 09/30 1901 - 10/01 0700 In: -  Out: 950 [Urine:950]   FHR: 140 baseline, good variability, + accels, int late decels.  Toco: q2 SVE: 6/90/-1 per RN   Assessment & Plan:  FHT CAT 2, dropped pitocin to 2 given MVU of 240, patient rebolused and repositioned with improvement GBS: neg Pitocin @ 1 Analgesia: epidural

## 2019-08-14 NOTE — Lactation Note (Signed)
This note was copied from a baby's chart. Lactation Consultation Note  Patient Name: Alicia Dunlap BWIOM'B Date: 08/14/2019   P2, Baby  3 hours old.  Mother states baby did not latch in L&D. Baby sleeping.  Reviewed hand expression with drops expressed. Hand expression helped mother evert nipple. Feed on demand with cues.  Goal 8-12+ times per day after first 24 hrs.  Place baby STS if not cueing.  Suggest mother call when baby cues for assistance with latching. Mom made aware of O/P services, breastfeeding support groups, community resources, and our phone # for post-discharge questions.       Maternal Data    Feeding Feeding Type: Breast Fed  LATCH Score                   Interventions    Lactation Tools Discussed/Used Pump Review: Setup, frequency, and cleaning Initiated by:: RN Date initiated:: 08/14/19   Consult Status      [name] 08/14/2019, 9:56 AM

## 2019-08-14 NOTE — Anesthesia Preprocedure Evaluation (Signed)
Anesthesia Evaluation  Patient identified by MRN, date of birth, ID band Patient awake    Reviewed: Allergy & Precautions, NPO status , Patient's Chart, lab work & pertinent test results  Airway Mallampati: II  TM Distance: >3 FB Neck ROM: Full    Dental  (+) Dental Advisory Given   Pulmonary neg pulmonary ROS, former smoker,    Pulmonary exam normal breath sounds clear to auscultation       Cardiovascular negative cardio ROS Normal cardiovascular exam Rhythm:Regular Rate:Normal     Neuro/Psych negative neurological ROS  negative psych ROS   GI/Hepatic negative GI ROS, Neg liver ROS,   Endo/Other  negative endocrine ROS  Renal/GU negative Renal ROS  negative genitourinary   Musculoskeletal negative musculoskeletal ROS (+)   Abdominal (+) + obese,   Peds negative pediatric ROS (+)  Hematology negative hematology ROS (+)   Anesthesia Other Findings   Reproductive/Obstetrics negative OB ROS                             Anesthesia Physical Anesthesia Plan  ASA: III  Anesthesia Plan: Epidural   Post-op Pain Management:    Induction:   PONV Risk Score and Plan:   Airway Management Planned:   Additional Equipment:   Intra-op Plan:   Post-operative Plan:   Informed Consent: I have reviewed the patients History and Physical, chart, labs and discussed the procedure including the risks, benefits and alternatives for the proposed anesthesia with the patient or authorized representative who has indicated his/her understanding and acceptance.       Plan Discussed with:   Anesthesia Plan Comments:         Anesthesia Quick Evaluation

## 2019-08-14 NOTE — Anesthesia Postprocedure Evaluation (Signed)
Anesthesia Post Note  Patient: Alicia Dunlap  Procedure(s) Performed: AN AD [place]     Patient location during evaluation: Mother Baby Anesthesia Type: Epidural Level of consciousness: awake and alert Pain management: pain level controlled Vital Signs Assessment: post-procedure vital signs reviewed and stable Respiratory status: spontaneous breathing Cardiovascular status: stable Postop Assessment: no headache, adequate PO intake, no backache, patient able to bend at knees, able to ambulate, epidural receding and no apparent nausea or vomiting Anesthetic complications: no    Last Vitals:  Vitals:   08/14/19 0818 08/14/19 0915  BP: 122/75 121/82  Pulse:  (!) 104  Resp:  18  Temp:  36.9 C  SpO2:      Last Pain:  Vitals:   08/14/19 0917  TempSrc:   PainSc: 0-No pain   Pain Goal:                   [name]

## 2019-08-14 NOTE — Anesthesia Procedure Notes (Signed)
Epidural Patient location during procedure: OB  Staffing Anesthesiologist: Bryahna Lesko, MD Performed: anesthesiologist   Preanesthetic Checklist Completed: patient identified, pre-op evaluation, timeout performed, IV checked, risks and benefits discussed and monitors and equipment checked  Epidural Patient position: sitting Prep: site prepped and draped and DuraPrep Patient monitoring: heart rate, continuous pulse ox and blood pressure Approach: midline Location: L2-L3 Injection technique: LOR air and LOR saline  Needle:  Needle type: Tuohy  Needle gauge: 17 G Needle length: 9 cm Needle insertion depth: 8 cm Catheter type: closed end flexible Catheter size: 19 Gauge Catheter at skin depth: 13 cm Test dose: negative  Assessment Sensory level: T8 Events: blood not aspirated, injection not painful, no injection resistance, negative IV test and no paresthesia  Additional Notes Reason for block:procedure for pain     

## 2019-08-15 LAB — BIRTH TISSUE RECOVERY COLLECTION (PLACENTA DONATION)

## 2019-08-15 MED ORDER — IBUPROFEN 600 MG PO TABS: 600.0000 mg | ORAL_TABLET | Freq: Four times a day (QID) | ORAL | 0 refills | Status: DC

## 2019-08-15 NOTE — Lactation Note (Signed)
This note was copied from a baby's chart. Lactation Consultation Note  Patient Name: Alicia Dunlap OVFIE'P Date: 08/15/2019 Reason for consult: Follow-up assessment   Baby 67 hours old.  Mother pumped some yesterday and received 10 ml She is discouraged but she recently pumped and received 5 ml. Mother states she tried breastfeeding once and he did not latch. Assisted mother w/ breastfeeding and baby latched in football hold on R breast. Gave baby 5 ml of breastmilk while breastfeeding with syringe. Baby sustained latch for 15 min and became sleepy. Demonstrated how to convert DEBP to manual and provided mother w/ another manual pump since she does not have DEBP at home. Encouraged her to pump q 3 hours. Feed on demand with cues.  Goal 8-12+ times per day after first 24 hrs.  Place baby STS if not cueing.  Reviewed engorgement care and monitoring voids/stools. Encouraged mother to continue breastfeeding and post pump after.  If baby is still hungry mother will supplement with formula.     Maternal Data    Feeding Feeding Type: Breast Fed  LATCH Score Latch: Grasps breast easily, tongue down, lips flanged, rhythmical sucking.  Audible Swallowing: A few with stimulation  Type of Nipple: Everted at rest and after stimulation  Comfort (Breast/Nipple): Soft / non-tender  Hold (Positioning): Assistance needed to correctly position infant at breast and maintain latch.  LATCH Score: 8  Interventions Interventions: Breast feeding basics reviewed  Lactation Tools Discussed/Used Pump Review: Milk Storage;Setup, frequency, and cleaning Initiated by:: LBJ Date initiated:: 08/15/19   Consult Status Consult Status: Follow-up Date: 08/16/19 Follow-up type: In-patient    Vivianne Master [facility] 08/15/2019, 11:49 AM

## 2019-08-15 NOTE — Discharge Instructions (Signed)

## 2019-08-15 NOTE — Plan of Care (Signed)
Pts. Condition will continue to improve 

## 2019-09-12 ENCOUNTER — Telehealth (INDEPENDENT_AMBULATORY_CARE_PROVIDER_SITE_OTHER): Payer: Medicaid Other | Admitting: Family Medicine

## 2019-09-12 DIAGNOSIS — Z3A41 41 weeks gestation of pregnancy: Secondary | ICD-10-CM

## 2019-09-12 DIAGNOSIS — O480 Post-term pregnancy: Secondary | ICD-10-CM

## 2019-09-12 NOTE — Progress Notes (Signed)
    TELEHEALTH VIRTUAL POSTPARTUM VISIT ENCOUNTER NOTE  I connected with [name] on 09/12/19 at 0936 by videochat ([email or web address]) at home and verified that I am speaking with the correct person using two identifiers.   I discussed the limitations, risks, security and privacy concerns of performing an evaluation and management service by telephone and the availability of in person appointments. I also discussed with the patient that there may be a patient responsible charge related to this service. The patient expressed understanding and agreed to proceed.  Appointment Date: 09/12/19  OBGYN Clinic: [facility]  Chief Complaint:  Postpartum Visit  History of Present Illness: Alicia Dunlap is a 28 yo African-American G2P2002, seen for her postpartum visit. Her past medical history is significant for HSV on Valtrex suppression and Sickle Cell Trait.  She is s/p normal spontaneous vaginal delivery on 08/13/19 at 41.1 weeks; she was discharged to home on 08/15/19 D#2. Pregnancy complicated by HSV on Valtrex suppression. Baby is doing well.  Complains of feeling like she "wasn't stitched up right" and would like an appointment make to have someone look at her vagina.  Vaginal bleeding or discharge: No  Mode of feeding infant: Breast Intercourse: No  Contraception: decline PP depression s/s: No .  Any bowel or bladder issues: constipation Pap smear: normal (date: 01/2019, due 01/2022)  Review of Systems: Positive for constipation. Her 12 point review of systems is negative or as noted in the History of Present Illness.  Physical Exam:  General:  Alert, oriented and cooperative.   Mental Status: Normal mood and affect perceived. Normal judgment and thought content.  Rest of physical exam deferred due to type of encounter  Assessment:Alicia Dunlap is a 28 yo African-American G2P2002 who is 4 weeks postpartum from a normal spontaneous vaginal delivery.  She is doing well.   Plan:   1. Contraception: none 2.  Doing well, repeat Pap 01/2022 3. Follow up in: 2 weeks for vaginal exam per patient request. Then as needed.   I provided 8 minutes of non-face-to-face time during this encounter.  Merilyn Baba, DO OB Fellow, [facility] 09/12/2019 9:52 AM

## 2019-09-12 NOTE — Patient Instructions (Signed)
Breastfeeding and Self-Care It is normal to have some problems when you start to breastfeed your new baby. But there are things that you can do to take care of yourself and help prevent many common problems. This includes keeping your breasts healthy and making sure that your baby's mouth attaches (latches) properly to your nipple for feedings. Work with your doctor or breastfeeding specialist (lactation consultant) to find what works best for you. Follow these instructions at home: Breastfeeding strategy   Always make sure that your baby latches properly to breastfeed.  Make sure that your baby is in a proper position. Try different breastfeeding positions to find one that works best for you and your baby.  Breastfeed when you feel like you need to make your breasts less full or when your baby shows signs of hunger. This is called "breastfeeding on demand."  Do not delay feedings.  Try to relax when it is time to feed your baby. This helps your body release milk from your breast.  To help increase milk flow: ? Remove a small amount of milk from your breast right before breastfeeding. Do this using a pump or by squeezing with your hand. ? Apply warm, moist heat to your breast right before feeding. You can do this in the shower or with hand towels soaked with warm water. ? Massage your breast right before or during feeding. Breast care   To help your breasts stay healthy and keep them from getting too dry: ? Avoid using soap on your nipples. ? Let your nipples air-dry for 3-4 minutes after each feeding. ? Use only cotton bra pads to soak up breast milk that leaks. Be sure to change the pads if they become soaked with milk. If you use bra pads that can be thrown away, change them often. ? Put some lanolin on your nipples after breastfeeding. Pure lanolin does not need to be washed off your nipple before you feed your baby again. Pure lanolin is not harmful to your baby. ? Rub some breast  milk into your nipples: ? Use your hand to squeeze out a few drops of breast milk. ? Gently massage the milk into your nipples. ? Let your nipples air-dry.  Wear a supportive nursing bra. Avoid wearing: ? Tight clothing. ? Underwire bras or bras that put pressure on your breasts.  Use ice to help relieve pain or swelling of your breasts: ? Put ice in a plastic bag. ? Place a towel between your skin and the bag. ? Leave the ice on for 20 minutes, 2-3 times a day. General instructions  Drink enough fluid to keep your pee (urine) pale yellow.  Get plenty of rest. Sleep when your baby sleeps.  Talk to your doctor or breastfeeding specialist before taking any herbal supplements. Contact a health care provider if:  You have nipple pain.  You have cracking or soreness in your nipples that lasts longer than 1 week.  Your breasts are overfilled with milk (engorgement) and this lasts longer than 48 hours.  You have a fever.  You have pus-like fluid coming from your nipple.  You have redness, a rash, swelling, itching, or burning on your breast.  Your baby does not gain weight.  Your baby loses weight. Summary  There are things that you can do to take care of yourself and help prevent many common breastfeeding problems.  Always make sure that your baby's mouth attaches (latches) to your nipple properly to breastfeed.  Keep your nipples   from getting too dry, drink plenty of fluid, and get plenty of rest.  Feed on demand. Do not delay feedings. This information is not intended to replace advice given to you by your health care provider. Make sure you discuss any questions you have with your health care provider. Document Released: 06/06/2017 Document Revised: 02/19/2019 Document Reviewed: 06/06/2017 Elsevier Patient Education  2020 [employer]  

## 2019-09-29 ENCOUNTER — Ambulatory Visit (INDEPENDENT_AMBULATORY_CARE_PROVIDER_SITE_OTHER): Payer: Medicaid Other | Admitting: Obstetrics and Gynecology

## 2019-09-29 ENCOUNTER — Other Ambulatory Visit: Payer: Self-pay

## 2019-09-29 ENCOUNTER — Encounter: Payer: Self-pay | Admitting: Obstetrics and Gynecology

## 2019-09-29 NOTE — Progress Notes (Signed)
28 yo s/p SVD on 08/14/19 here for perineal exam. Patient had a first degree laceration that was repaired at the time of her delivery. Patient denies any pain. She has been sexually active without concerns. She states that upon observations, she felt that the laceration may not have been reapproximated appropriately  Past Medical History:  Diagnosis Date  . Bacterial vaginosis   . DENTAL CARIES 08/11/2010   Qualifier: Diagnosis of  By: [place] MD, Sarah    . Drug abuse, marijuana 09/06/2012  . High risk sexual behavior 02/12/2013   Multiple STD's checks: 8 since 2011.   . Tobacco abuse 06/11/2017   Past Surgical History:  Procedure Laterality Date  . NO PAST SURGERIES     No family history on file. Social History   Tobacco Use  . Smoking status: Former Smoker    Types: Cigarettes  . Smokeless tobacco: Never Used  Substance Use Topics  . Alcohol use: Not Currently  . Drug use: Not Currently   ROS See pertinent in HPI Blood pressure 115/80, pulse 81, weight 242 lb (109.8 kg), last menstrual period 10/30/2018, unknown if currently breastfeeding.  GENERAL: Well-developed, well-nourished female in no acute distress.  ABDOMEN: Soft, nontender, nondistended. No organomegaly. PELVIC: Normal external female genitalia. Vagina is pink and rugated.  Normal in appearance EXTREMITIES: No cyanosis, clubbing, or edema, 2+ distal pulses.  A/P 28 yo s/p SVD on 10/1 with first degree laceration - First degree laceration healed completely - Reassurance provided on normal anatomy - RTC prn or for annual exam

## 2019-10-15 ENCOUNTER — Other Ambulatory Visit: Payer: Self-pay

## 2019-10-15 ENCOUNTER — Encounter: Payer: Self-pay | Admitting: Family Medicine

## 2019-10-15 ENCOUNTER — Ambulatory Visit (INDEPENDENT_AMBULATORY_CARE_PROVIDER_SITE_OTHER): Payer: Medicaid Other | Admitting: Family Medicine

## 2019-10-15 VITALS — BP 110/62 | HR 93 | Temp 98.9°F | Wt 238.0 lb

## 2019-10-15 DIAGNOSIS — K649 Unspecified hemorrhoids: Secondary | ICD-10-CM | POA: Diagnosis not present

## 2019-10-15 MED ORDER — POLYETHYLENE GLYCOL 3350 17 GM/SCOOP PO POWD: 17.0000 g | Freq: Two times a day (BID) | ORAL | 1 refills | Status: DC | PRN

## 2019-10-15 MED ORDER — SITZ BATH MISC: 1.0000 "application " | Freq: Every day | 2 refills | Status: DC

## 2019-10-15 NOTE — Patient Instructions (Addendum)
Hemorrhoids Hemorrhoids are swollen veins that may develop:  In the butt (rectum). These are called internal hemorrhoids.  Around the opening of the butt (anus). These are called external hemorrhoids. Hemorrhoids can cause pain, itching, or bleeding. Most of the time, they do not cause serious problems. They usually get better with diet changes, lifestyle changes, and other home treatments. What are the causes? This condition may be caused by:  Having trouble pooping (constipation).  Pushing hard (straining) to poop.  Watery poop (diarrhea).  Pregnancy.  Being very overweight (obese).  Sitting for long periods of time.  Heavy lifting or other activity that causes you to strain.  Anal sex.  Riding a bike for a long period of time. What are the signs or symptoms? Symptoms of this condition include:  Pain.  Itching or soreness in the butt.  Bleeding from the butt.  Leaking poop.  Swelling in the area.  One or more lumps around the opening of your butt. How is this diagnosed? A doctor can often diagnose this condition by looking at the affected area. The doctor may also:  Do an exam that involves feeling the area with a gloved hand (digital rectal exam).  Examine the area inside your butt using a small tube (anoscope).  Order blood tests. This may be done if you have lost a lot of blood.  Have you get a test that involves looking inside the colon using a flexible tube with a camera on the end (sigmoidoscopy or colonoscopy). How is this treated? This condition can usually be treated at home. Your doctor may tell you to change what you eat, make lifestyle changes, or try home treatments. If these do not help, procedures can be done to remove the hemorrhoids or make them smaller. These may involve:  Placing rubber bands at the base of the hemorrhoids to cut off their blood supply.  Injecting medicine into the hemorrhoids to shrink them.  Shining a type of  light energy onto the hemorrhoids to cause them to fall off.  Doing surgery to remove the hemorrhoids or cut off their blood supply. Follow these instructions at home: Eating and drinking   Eat foods that have a lot of fiber in them. These include whole grains, beans, nuts, fruits, and vegetables.  Ask your doctor about taking products that have added fiber (fibersupplements).  Reduce the amount of fat in your diet. You can do this by: ? Eating low-fat dairy products. ? Eating less red meat. ? Avoiding processed foods.  Drink enough fluid to keep your pee (urine) pale yellow. Managing pain and swelling   Take a warm-water bath (sitz bath) for 20 minutes to ease pain. Do this 3-4 times a day. You may do this in a bathtub or using a portable sitz bath that fits over the toilet.  If told, put ice on the painful area. It may be helpful to use ice between your warm baths. ? Put ice in a plastic bag. ? Place a towel between your skin and the bag. ? Leave the ice on for 20 minutes, 2-3 times a day. General instructions  Take over-the-counter and prescription medicines only as told by your doctor. ? Medicated creams and medicines may be used as told.  Exercise often. Ask your doctor how much and what kind of exercise is best for you.  Go to the bathroom when you have the urge to poop. Do not wait.  Avoid pushing too hard when you poop.  Keep your butt dry and clean. Use wet toilet paper or moist towelettes after pooping.  Do not sit on the toilet for a long time.  Keep all follow-up visits as told by your doctor. This is important. Contact a doctor if you:  Have pain and swelling that do not get better with treatment or medicine.  Have trouble pooping.  Cannot poop.  Have pain or swelling outside the area of the hemorrhoids. Get help right away if you have:  Bleeding that will not stop. Summary  Hemorrhoids are swollen veins in the butt or around the opening of the  butt.  They can cause pain, itching, or bleeding.  Eat foods that have a lot of fiber in them. These include whole grains, beans, nuts, fruits, and vegetables.  Take a warm-water bath (sitz bath) for 20 minutes to ease pain. Do this 3-4 times a day. This information is not intended to replace advice given to you by your health care provider. Make sure you discuss any questions you have with your health care provider. Document Released: 08/08/2008 Document Revised: 11/07/2018 Document Reviewed: 03/21/2018 Elsevier Patient Education  2020 [employer].   Today we talked about making sure that you stay hydrated, keep taking your Colace, start taking the new MiraLAX I am giving you and make sure that you increase the fiber in your diet.  I also put in a prescription for some sitz bath's which might help with the irritation.  If anything gets significantly worse please let us know.

## 2019-10-15 NOTE — Progress Notes (Signed)
    Subjective:  Alicia Dunlap is a 28 y.o. female who presents to the [facility] today with a chief complaint of constipation.   HPI: Patient is 8 weeks postpartum, now with constipation.  She says she is able to go multiple times per day but feels like she has to strain.  There is minor drops of blood when she does so but no specific pain at the moment.  She says she has felt a bump which she thinks is hemorrhoids which she was told could happen.  She did not notify Dr. Roland Rack about this at her last OB follow-up and she is refusing a physical exam for confirmation at this time.  She request that we treat as hemorrhoids  Objective:  Physical Exam: BP 110/62   Pulse 93   Temp 98.9 F (37.2 C) (Oral)   Wt 238 lb (108 kg)   LMP 10/30/2018 (Exact Date)   SpO2 96%   BMI 37.28 kg/m   Gen: NAD, pleasant and conversing comfortably CV: RRR with no murmurs appreciated Pulm: NWOB, CTAB with no crackles, wheezes, or rhonchi MSK: no edema, cyanosis, or clubbing noted Skin: warm, dry Neuro: grossly normal, moves all extremities Psych: Normal affect and thought content  No results found for this or any previous visit (from the past 72 hour(s)).   Assessment/Plan:  Hemorrhoids Patient with complaint of "feeling of "and minor drips of blood after straining to go to the bathroom now 8 weeks postpartum.  Patient is refusing exam but we will can treat conservatively as hemorrhoids.  Offering sitz bath's, patient will continue taking Colace, add MiraLAX, will increase fiber in diet.  Instructed to not strain on the toilet and make sure she stays hydrated.  Can check in in a few weeks   Sherene Sires, [place] - PGY3 10/16/2019 5:30 PM

## 2019-10-16 NOTE — Assessment & Plan Note (Signed)
Patient with complaint of "feeling of "and minor drips of blood after straining to go to the bathroom now 8 weeks postpartum.  Patient is refusing exam but we will can treat conservatively as hemorrhoids.  Offering sitz bath's, patient will continue taking Colace, add MiraLAX, will increase fiber in diet.  Instructed to not strain on the toilet and make sure she stays hydrated.  Can check in in a few weeks

## 2019-12-18 ENCOUNTER — Ambulatory Visit (INDEPENDENT_AMBULATORY_CARE_PROVIDER_SITE_OTHER): Payer: Medicaid Other | Admitting: Family Medicine

## 2019-12-18 ENCOUNTER — Other Ambulatory Visit: Payer: Self-pay

## 2019-12-18 ENCOUNTER — Telehealth: Payer: Self-pay

## 2019-12-18 VITALS — BP 102/68 | HR 94 | Ht 67.0 in | Wt 237.4 lb

## 2019-12-18 DIAGNOSIS — Z3200 Encounter for pregnancy test, result unknown: Secondary | ICD-10-CM | POA: Diagnosis not present

## 2019-12-18 DIAGNOSIS — L301 Dyshidrosis [pompholyx]: Secondary | ICD-10-CM | POA: Insufficient documentation

## 2019-12-18 LAB — POCT URINE PREGNANCY: Preg Test, Ur: NEGATIVE

## 2019-12-18 MED ORDER — TRIAMCINOLONE ACETONIDE 0.1 % EX OINT: 1.0000 "application " | TOPICAL_OINTMENT | Freq: Two times a day (BID) | CUTANEOUS | 1 refills | Status: DC

## 2019-12-18 NOTE — Assessment & Plan Note (Addendum)
3 month history of erythematous and vesicular rash on R. 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 Patient agreeable to plan of trial with triamcinolone ointment applied to affected area. She will follow - up as needed or if it doesn't improve.

## 2019-12-18 NOTE — Telephone Encounter (Signed)
Informed pt of negative pregnancy result. Pt understood.  Aquilla Solian, CMA

## 2019-12-18 NOTE — Patient Instructions (Signed)
Thank you for coming to see me today. It was a pleasure! Today we talked about:   The rash on your pinky we believe it is due to eczema.  I have sent in a stronger steroid to your pharmacy.  Please apply this to your finger twice daily for 7 days.  This should help with that go away.  If you are worse rashes or does not improve within a few weeks then do not hesitate to come back to the office.  In the meantime you can continue to use creams and ointments such as Vaseline or Aquaphor cream in order to keep the area moistened.  Please follow-up as needed.  If you have any questions or concerns, please do not hesitate to call the office at [phone].  Take Care,   [place] Jenalyn Girdner, DO  Dyshidrotic Eczema Dyshidrotic eczema (pompholyx) is a type of eczema that causes very itchy (pruritic), fluid-filled blisters (vesicles) to form on the hands and feet. It can affect people of any age, but is more common before the age of 49. There is no cure, but treatment and certain lifestyle changes can help relieve symptoms. What are the causes? The cause of this condition is not known. What increases the risk? You are more likely to develop this condition if:  You wash your hands frequently.  You have a personal history or family history of eczema, allergies, asthma, or hay fever.  You are allergic to metals such as nickel or cobalt.  You work with cement.  You smoke. What are the signs or symptoms? Symptoms of this condition may affect the hands, feet, or both. Symptoms may come and go (recur), and may include:  Severe itching, which may happen before blisters appear.  Blisters. These may form suddenly. ? In the early stages, blisters may form near the fingertips. ? In severe cases, blisters may grow to large blister masses (bullae). ? Blisters resolve in 2-3 weeks without bursting. This is followed by a dry phase in which itching eases.  Pain and swelling.  Cracks or long, narrow  openings (fissures) in the skin.  Severe dryness.  Ridges on the nails. How is this diagnosed? This condition may be diagnosed based on:  A physical exam.  Your symptoms.  Your medical history.  Skin scrapings to rule out a fungal infection.  Testing a swab of fluid for bacteria (culture).  Removing and checking a small piece of skin (biopsy) in order to test for infection or to rule out other conditions.  Skin patch tests. These tests involve taking patches that contain possible allergens and placing them on your back. Your health care provider will wait a few days and then check to see if an allergic reaction occurred. These tests may be done if your health care provider suspects allergic reactions, or to rule out other types of eczema. You may be referred to a health care provider who specializes in the skin (dermatologist) to help diagnose and treat this condition. How is this treated? There is no cure for this condition, but treatment can help relieve symptoms. Depending on how many blisters you have and how severe they are, your health care provider may suggest:  Avoiding allergens, irritants, or triggers that worsen symptoms. This may involve lifestyle changes such as: ? Using different lotions or soaps. ? Avoiding hot weather or places that will cause you to sweat a lot. ? Managing stress with coping techniques such as relaxation and exercise, and asking for help when  you need it. ? Diet changes as recommended by your health care provider.  Using a clean, damp towel (cool compress) to relieve symptoms.  Soaking in a bath that contains a type of salt that relieves irritation (aluminum acetate soaks).  Medicine taken by mouth to reduce itching (oral antihistamines).  Medicine applied to the skin to reduce swelling and irritation (topical corticosteroids).  Medicine that reduces the activity of the body's disease-fighting system (immunosuppressants) to treat inflammation.  This may be given in severe cases.  Antibiotic medicines to treat bacterial infection.  Light therapy (phototherapy). This involves shining ultraviolet (UV) light on affected skin in order to reduce itchiness and inflammation. Follow these instructions at home: Bathing and skin care   Wash skin gently. After bathing or washing your hands, pat your skin dry. Avoid rubbing your skin.  Remove all jewelry before bathing. If the skin under the jewelry stays wet, blisters may form or get worse.  Apply cool compresses as told by your health care provider: ? Soak a clean towel in cool water. ? Wring out excess water until towel is damp. ? Place the towel over affected skin. Leave the towel on for 20 minutes at a time, 2-3 times a day.  Use mild soaps, cleansers, and lotions that do not contain dyes, perfumes, or other irritants.  Keep your skin hydrated. To do this: ? Avoid very hot water. Take lukewarm baths or showers. ? Apply moisturizer within three minutes of bathing. This locks in moisture. Medicines  Take and apply over-the-counter and prescription medicines only as told by your health care provider.  If you were prescribed antibiotic medicine, take or apply it as told by your health care provider. Do not stop using the antibiotic even if you start to feel better. General instructions  Identify and avoid triggers and allergens.  Keep fingernails short to avoid breaking open the skin while scratching.  Use waterproof gloves to protect your hands when doing work that keeps your hands wet for a long time.  Wear socks to keep your feet dry.  Do not use any products that contain nicotine or tobacco, such as cigarettes and e-cigarettes. If you need help quitting, ask your health care provider.  Keep all follow-up visits as told by your health care provider. This is important. Contact a health care provider if:  You have symptoms that do not go away.  You have signs of infection,  such as: ? Crusting, pus, or a bad smell. ? More redness, swelling, or pain. ? Increased warmth in the affected area. Summary  Dyshidrotic eczema (pompholyx) is a type of eczema that causes very itchy (pruritic), fluid-filled blisters (vesicles) to form on the hands and feet.  The cause of this condition is not known.  There is no cure for this condition, but treatment can help relieve symptoms. Treatment depends on how many blisters you have and how severe they are.  Use mild soaps, cleansers, and lotions that do not contain dyes, perfumes, or other irritants. Keep your skin hydrated. This information is not intended to replace advice given to you by your health care provider. Make sure you discuss any questions you have with your health care provider. Document Revised: 02/19/2019 Document Reviewed: 03/15/2017 Elsevier Patient Education  [place].

## 2019-12-18 NOTE — Progress Notes (Signed)
   CHIEF COMPLAINT / HPI:  Right pinky rash - Rash has been present for the past 3 months on the medial portion of her 5th digit. She describes the rash as red and bumpy. Notes sometimes she notices fluid weeping from the rash. Describes her rash as more itchy than painful - Attempted to alleviate symptoms with cortizone 10 cream and Aquaphor cream. 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  Extremities: Localized, erythematous, un-roofed vesicular, pruritic rash on medial aspect of R. 5th digit    ASSESSMENT / PLAN:  Dyshidrotic eczema 3 month history of erythematous and vesicular rash on R. 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 Patient agreeable to plan of trial with triamcinolone ointment applied to affected area. She will follow - up as needed or if it doesn't improve.   Shirley, DO [facility]  I have personally seen and examined this patient with the medical student and agree with the above note. I have made any changes to the note above.   [facility] Shirley, D.O. 12/18/2019, 8:08 PM PGY-3, [facility]

## 2020-01-13 DIAGNOSIS — Z79899 Other long term (current) drug therapy: Secondary | ICD-10-CM | POA: Diagnosis not present

## 2020-01-15 DIAGNOSIS — Z79899 Other long term (current) drug therapy: Secondary | ICD-10-CM | POA: Diagnosis not present

## 2020-01-20 DIAGNOSIS — Z79899 Other long term (current) drug therapy: Secondary | ICD-10-CM | POA: Diagnosis not present

## 2020-01-22 DIAGNOSIS — Z79899 Other long term (current) drug therapy: Secondary | ICD-10-CM | POA: Diagnosis not present

## 2020-01-26 DIAGNOSIS — Z79899 Other long term (current) drug therapy: Secondary | ICD-10-CM | POA: Diagnosis not present

## 2020-01-28 DIAGNOSIS — Z79899 Other long term (current) drug therapy: Secondary | ICD-10-CM | POA: Diagnosis not present

## 2020-02-03 DIAGNOSIS — Z79899 Other long term (current) drug therapy: Secondary | ICD-10-CM | POA: Diagnosis not present

## 2020-02-05 DIAGNOSIS — Z79899 Other long term (current) drug therapy: Secondary | ICD-10-CM | POA: Diagnosis not present

## 2020-02-09 DIAGNOSIS — Z79899 Other long term (current) drug therapy: Secondary | ICD-10-CM | POA: Diagnosis not present

## 2020-02-11 DIAGNOSIS — Z79899 Other long term (current) drug therapy: Secondary | ICD-10-CM | POA: Diagnosis not present

## 2020-02-16 DIAGNOSIS — Z79899 Other long term (current) drug therapy: Secondary | ICD-10-CM | POA: Diagnosis not present

## 2020-02-18 DIAGNOSIS — Z79899 Other long term (current) drug therapy: Secondary | ICD-10-CM | POA: Diagnosis not present

## 2020-02-24 DIAGNOSIS — Z79899 Other long term (current) drug therapy: Secondary | ICD-10-CM | POA: Diagnosis not present

## 2020-02-26 DIAGNOSIS — Z79899 Other long term (current) drug therapy: Secondary | ICD-10-CM | POA: Diagnosis not present

## 2020-03-01 DIAGNOSIS — Z79899 Other long term (current) drug therapy: Secondary | ICD-10-CM | POA: Diagnosis not present

## 2020-03-03 DIAGNOSIS — Z79899 Other long term (current) drug therapy: Secondary | ICD-10-CM | POA: Diagnosis not present

## 2020-03-09 DIAGNOSIS — Z79899 Other long term (current) drug therapy: Secondary | ICD-10-CM | POA: Diagnosis not present

## 2020-03-11 DIAGNOSIS — Z79899 Other long term (current) drug therapy: Secondary | ICD-10-CM | POA: Diagnosis not present

## 2020-03-15 DIAGNOSIS — Z79899 Other long term (current) drug therapy: Secondary | ICD-10-CM | POA: Diagnosis not present

## 2020-03-17 DIAGNOSIS — Z79899 Other long term (current) drug therapy: Secondary | ICD-10-CM | POA: Diagnosis not present

## 2020-03-19 ENCOUNTER — Ambulatory Visit (INDEPENDENT_AMBULATORY_CARE_PROVIDER_SITE_OTHER): Payer: Medicaid Other | Admitting: Student in an Organized Health Care Education/Training Program

## 2020-03-19 ENCOUNTER — Other Ambulatory Visit: Payer: Self-pay

## 2020-03-19 DIAGNOSIS — L301 Dyshidrosis [pompholyx]: Secondary | ICD-10-CM

## 2020-03-19 MED ORDER — TRIAMCINOLONE ACETONIDE 0.5 % EX OINT: 1.0000 "application " | TOPICAL_OINTMENT | Freq: Two times a day (BID) | CUTANEOUS | 0 refills | Status: DC

## 2020-03-19 NOTE — Progress Notes (Signed)
    SUBJECTIVE:   CHIEF COMPLAINT / HPI: follow up for improved, but not resolved dyshidrotic eczema.   Patient used prescribed triamcinolone with some improvement but not resolved. Used sons eczema steroid cream which is higher strength and that seems to clear it up. She denies spreading of the rash and no new occurences. Has decreased harsh chemical use.   PERTINENT  PMH / PSH: none  OBJECTIVE:   BP 105/70   Pulse 81   Ht 5\' 7"  (1.702 m)   Wt 235 lb 6.4 oz (106.8 kg)   SpO2 98%   BMI 36.87 kg/m   General: NAD, pleasant, able to participate in exam Extremities: no edema or cyanosis. WWP. Full ROM of effected digit Skin: warm and dry, rash as seen in clinical image below. Non-fluctuant. Appears greatly improved since last appointment.  Neuro: alert and oriented x4, no focal deficits Psych: Normal affect and mood     ASSESSMENT/PLAN:   Dyshidrotic eczema Improved. - prescribed higher dose triamcinolone - provided with work note.      , DO [facility]

## 2020-03-19 NOTE — Patient Instructions (Signed)
It was a pleasure to see you today!  To summarize our discussion for this visit:  I'm glad to see that your eczema is getting better. I am sending in a new prescription for higher dose steroid to be used sparingly. It sounds like you are already doing a good job avoiding triggers.  Please let us know if it is not improving with this treatment. We could consider a biopsy in the future if not resolving.   Some additional health maintenance measures we should update are: Health Maintenance Due  Topic Date Due  . COVID-19 Vaccine (1) Never done  . TETANUS/TDAP  Never done  .    Please return to our clinic to see me as needed.  Call the clinic at [phone] if your symptoms worsen or you have any concerns.   Thank you for allowing me to take part in your care,  Dr. Jamelle Rushing

## 2020-03-22 DIAGNOSIS — Z79899 Other long term (current) drug therapy: Secondary | ICD-10-CM | POA: Diagnosis not present

## 2020-03-23 ENCOUNTER — Other Ambulatory Visit: Payer: Self-pay

## 2020-03-23 ENCOUNTER — Ambulatory Visit (INDEPENDENT_AMBULATORY_CARE_PROVIDER_SITE_OTHER): Payer: Medicaid Other | Admitting: Family Medicine

## 2020-03-23 VITALS — BP 110/60 | HR 93 | Ht 67.0 in | Wt 238.0 lb

## 2020-03-23 DIAGNOSIS — M545 Low back pain, unspecified: Secondary | ICD-10-CM

## 2020-03-23 DIAGNOSIS — M25562 Pain in left knee: Secondary | ICD-10-CM | POA: Insufficient documentation

## 2020-03-23 DIAGNOSIS — G8929 Other chronic pain: Secondary | ICD-10-CM | POA: Insufficient documentation

## 2020-03-23 MED ORDER — NAPROXEN 500 MG PO TABS: 500.0000 mg | ORAL_TABLET | Freq: Two times a day (BID) | ORAL | 0 refills | Status: DC

## 2020-03-23 NOTE — Assessment & Plan Note (Signed)
Starting naproxen BID. Recommended at home strength exercises and OTC topical analgesics as needed for pain control. If not improvement in 4-6 weeks will consider X-ray of lumbar spine and referral to sports med.

## 2020-03-23 NOTE — Patient Instructions (Signed)
Thank you for coming to see me today. It was a pleasure to see you.   I suspect your symptoms are due to your poor posture during work and possibly some old changes to the bones from your history of dancing. I would recommend that you try to change positions frequently at work.  I have also referred you to physical therapy as I expect that strengthening of your muscles in your back would help as well. I have sent in a prescription for naproxen.  You can take this twice a day to help with your discomfort.  You may also try over-the-counter Voltaren gel, icy hot, capsaicin cream, heating pad/ice as needed as well.  We have decided to wait on imaging at this time.  If no improvement in 4 to 6 weeks, then please follow-up and we will consider imaging and referral to sports medicine.  If you have any questions or concerns, please do not hesitate to call the office at [phone].  Take Care,   Dr. Orpah Cobb, DO Resident Physician [facility] [phone]

## 2020-03-23 NOTE — Assessment & Plan Note (Signed)
Pain control same as for lumber back pain. Also recommended at home strength exercises for knee. If not improvement in 4-6 weeks will consider X-ray of L knee and referral to sports med.

## 2020-03-23 NOTE — Progress Notes (Addendum)
SUBJECTIVE:   CHIEF COMPLAINT / HPI:   Lower back pain: Alicia Dunlap is 29 yo female with a long history or left knee pain who presents with acute on chronic lower back pain. 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 However this has worsened since epidural in October of 2020 and was recently aggravated by lifting a heavy pack of water. She describes the pain as mostly paraspinal muscular, but also some spinal pain. She has no radicular pain. Pain is worsened by sitting and she sits for work 8 hours a day. Pain is relieved by standing and laying flat on her back. She additionally has continued left knee pain which she has had for several years. She recalls 2 events of patellar subluxation when she was a teenager. She was seen by sports medicine in 2013 for similar back and knee symptoms but was unable to get imaging and treatment due to loss of insurance at the time. Sports medicine stated that her pain was most likely postural and due to weak paraspinals. She denies any urinary or fecal incontinence, spinal injury, unexplained weight loss. She denies any catching or locking of her left knee. She has not treated the pain with anything thus far.   PERTINENT  PMH / PSH: chronic left knee and lumbar pain ROS: see HPI  OBJECTIVE:   BP 110/60   Pulse 93   Ht 5\' 7"  (1.702 m)   Wt 238 lb (108 kg)   SpO2 97%   BMI 37.28 kg/m   Physical Exam  Constitutional: She is well-developed, well-nourished, and in no distress.  Musculoskeletal:        General: Tenderness present. No deformity. Normal range of motion.     Comments: FABER: negative Fadir: negative Medial joint line tenderness of the left knee Patellar compression: positive Good strength and sensation bilaterally Patellar and ankle reflexes 2+ bilaterally.  Neurological: She is alert. Gait normal. Coordination normal.    ASSESSMENT/PLAN:   Lumbar pain Starting naproxen BID. Recommended at home strength exercises  and OTC topical analgesics as needed for pain control. If not improvement in 4-6 weeks will consider X-ray of lumbar spine and referral to sports med.  Chronic pain of left knee Pain control same as for lumber back pain. Also recommended at home strength exercises for knee. If not improvement in 4-6 weeks will consider X-ray of L knee and referral to sports med.     [place]   Attestation: I was personally present and performed or re-performed the history, physical exam, and medical decision making activities of this service and have verified that the service and findings are accurately documented in the student's note.   The following is my additional documentation.  Physical Exam: Gen: Very pleasant younger female, sitting comfortably in exam chair, well developed, no acute distress Resp: Breathing comfortably on room air, speaking in full sentences MSK:  Lumbar spine:   - Inspection: no gross deformity or asymmetry, swelling or ecchymosis   - Palpation: TTP along paraspinal muscles and spinal processes, No TTP along SI joints bilaterally   - ROM: full active ROM of the lumbar spine in flexion and extension without pain   - Strength: 5/5 strength of lower extremity in L4-S1 nerve root distributions b/l; normal gait   - Neuro: sensation intact in the L4-S1 nerve root distribution b/l, 2+ L4 and S1 reflexes   - Special testing: Negative straight leg raise,  negative slump, negative Stork test, Negative FABER   Left Knee:   - Inspection: no gross deformity. No swelling/effusion, erythema or bruising. Skin intact   - Palpation: Tender to palpation along medial joint line and inferior patella    - ROM: full active ROM with flexion and extension in knee and hip   - Strength: 5/5 strength   - Neuro/vasc: NV intact   - Special Tests:     - LIGAMENTS: negative anterior and posterior drawer, negative Lachman's, no MCL or LCL laxity      -- MENISCUS: negative McMurray's   -- PF JOINT: nml patellar mobility bilaterally. Positive pain with patellar grind Right Knee:   - Inspection: no gross deformity. No swelling/effusion, erythema or bruising. Skin intact   - Palpation: no TTP   - ROM: full active ROM with flexion and extension in knee and hip   - Strength: 5/5 strength   - Neuro/vasc: NV intact  Hips: normal ROM, negative FABER and FADIR bilaterally Skin: Warm and well perfused, no rashes appreciated on exposed skin  Assessment/Plan Alicia Dunlap is a 29 y.o. female presenting with acute on chronic lumbar pain and left knee pain. She denies any recent trauma but does endorse history of being a Horticulturist, commercial. Her pain is exacerbated by long periods of sitting which is required by her work. Exam is unremarkable except for some tenderness along paraspinal/spinous processes of lumbar spin and medial left knee joint and patellar tenderness. No red flag symptoms and no neuropathic pain. She has been seen in the past for similar pain which was felt to be postural related. I agree with this assessment as well.  - We discussed ways for her to improve her posture and I recommended she try to use a standing desk throughout the day to help her limit the sitting position.  - Will do trial of Naproxen BID x 14 days.  - Will refer to physical therapy.  - Considered imaging however given age of patient and chronicity, did not feel it would change management at this time.  - Recommended she RTC if no improvement in 4-6 weeks or worsening. Will obtain lumbar spine, hip and knee x-ray at that time.  - Recommended OTC topical analgesics PRN, heat/ice PRN.  - Patient voiced understanding and agreement with plan.   Orpah Cobb, DO 03/25/2020 9:23 AM

## 2020-03-24 DIAGNOSIS — Z79899 Other long term (current) drug therapy: Secondary | ICD-10-CM | POA: Diagnosis not present

## 2020-03-24 NOTE — Assessment & Plan Note (Signed)
Improved. - prescribed higher dose triamcinolone - provided with work note.

## 2020-03-30 DIAGNOSIS — Z79899 Other long term (current) drug therapy: Secondary | ICD-10-CM | POA: Diagnosis not present

## 2020-04-01 DIAGNOSIS — Z79899 Other long term (current) drug therapy: Secondary | ICD-10-CM | POA: Diagnosis not present

## 2020-04-13 ENCOUNTER — Ambulatory Visit: Payer: Medicaid Other | Attending: Family Medicine | Admitting: Physical Therapy

## 2020-04-13 ENCOUNTER — Encounter: Payer: Self-pay | Admitting: Physical Therapy

## 2020-04-13 ENCOUNTER — Other Ambulatory Visit: Payer: Self-pay

## 2020-04-13 DIAGNOSIS — M6281 Muscle weakness (generalized): Secondary | ICD-10-CM

## 2020-04-13 DIAGNOSIS — M25562 Pain in left knee: Secondary | ICD-10-CM | POA: Insufficient documentation

## 2020-04-13 DIAGNOSIS — G8929 Other chronic pain: Secondary | ICD-10-CM | POA: Insufficient documentation

## 2020-04-13 DIAGNOSIS — M5442 Lumbago with sciatica, left side: Secondary | ICD-10-CM | POA: Insufficient documentation

## 2020-04-13 NOTE — Patient Instructions (Signed)
.      Garen Lah, PT Certified Exercise Expert for the Aging Adult  04/13/20 11:03 AM Phone: [phone] Fax: [phone]   .ion

## 2020-04-13 NOTE — Therapy (Signed)
[facility] Outpatient Rehabilitation [facility] [address] [place], [place], [address] Phone: [phone]   Fax:  [phone]  Physical Therapy Evaluation  Patient Details  Name: Alicia Dunlap MRN: [id number] Date of Birth: July 21, 1991 Referring Provider (PT): [name]   Encounter Date: 04/13/2020  PT End of Session - 04/13/20 1037    Visit Number  1    Number of Visits  16    Date for PT Re-Evaluation  06/08/20    Authorization Type  MCD  1st authorization submitted 04-13-20 for 3 visits until 05-04-20    Authorization - Visit Number  1    Authorization - Number of Visits  4    PT Start Time  1018    PT Stop Time  1101    PT Time Calculation (min)  43 min    Activity Tolerance  Patient tolerated treatment well    Behavior During Therapy  [facility] for tasks assessed/performed       Past Medical History:  Diagnosis Date  . Bacterial vaginosis   . DENTAL CARIES 08/11/2010   Qualifier: Diagnosis of  By: [place] MD, Sarah    . Drug abuse, marijuana 09/06/2012  . High risk sexual behavior 02/12/2013   Multiple STD's checks: 8 since 2011.   . Tobacco abuse 06/11/2017    Past Surgical History:  Procedure Laterality Date  . NO PAST SURGERIES      There were no vitals filed for this visit.   Subjective Assessment - 04/13/20 1024    Subjective  I dislocated my LT knee in 7th grade and then in high school I fell on the concrete and dislocated again onLT.  So I am now 29 yo and still have pain with LT.  My back pain started after I had epidurals.    Pertinent History  lumbar pain and LT knee disclocation. Herpes    Limitations  Sitting;Standing;Walking;Lifting   29 yo and 8 month baby   How long can you sit comfortably?  < 5 min  I cant sit and bend knee and I cant hang legs off bar stool    How long can you stand comfortably?  15 - 20 minutes    How long can you walk comfortably?  walk for and hour    Diagnostic tests  not recently    Patient Stated Goals   trying to get my knee better and back better, I want to work outside the home and be prepared to lift    Currently in Pain?  Yes    Pain Score  8     Pain Location  Back    Pain Orientation  Right;Left;Mid    Pain Descriptors / Indicators  Aching;Shooting;Pressure    Pain Type  Chronic pain    Pain Onset  More than a month ago    Pain Frequency  Constant    Aggravating Factors   sitting    Multiple Pain Sites  Yes    Pain Score  9    Pain Location  Knee    Pain Orientation  Left    Pain Descriptors / Indicators  Aching;Sharp;Shooting    Pain Type  Chronic pain    Pain Onset  More than a month ago    Pain Frequency  Constant    Aggravating Factors   bending knee , ( I keep it propped up while I am doing work on Animator at home         [facility] PT  Assessment - 04/13/20 0001      Assessment   Medical Diagnosis  low back pain ,  LT knee pain    Referring Provider (PT)  Doralee AlbinoHensel, William MD    Onset Date/Surgical Date  --   knee pain dislocation 7th grade?HS back-< 5 years   Hand Dominance  Right    Next MD Visit  not scheduled    Prior Therapy  none      Precautions   Precaution Comments  Alergic to Amoxicillin      Balance Screen   Has the patient fallen in the past 6 months  No    Has the patient had a decrease in activity level because of a fear of falling?   No    Is the patient reluctant to leave their home because of a fear of falling?   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   Leisure  caring for 8 mo son, 359 yo dtr      Cognition    Overall Cognitive Status  Within Functional Limits for tasks assessed      Observation/Other Assessments   Focus on Therapeutic Outcomes (FOTO)   NA  MCD      Functional Tests   Functional tests  Squat;Sit to Stand      Squat   Comments  unable to squat more than 60 degrees hip flexion due to LT knee pain      Lunges   Comments  difficulty with lungeing esp with LT knee end range and needs UE suppport      Sit to Stand   Comments  5 x STS 11.36      Posture/Postural Control   Posture/Postural Control  Postural limitations    Postural Limitations  Rounded Shoulders;Forward head;Anterior pelvic tilt      ROM / Strength   AROM / PROM / Strength  AROM;Strength      AROM   Right Knee Extension  0    Right Knee Flexion  135   PROM  137   Left Knee Extension  0    Left Knee Flexion  129   PROM 130 pain   Lumbar Flexion  75%   able to touch ankle but pain at end range   Lumbar Extension  75%   pain mid back   Lumbar - Right Side Bend  75%    Lumbar - Left Side Bend  75%    Lumbar - Right Rotation  75%    Lumbar - Left Rotation  75%      Strength   Overall Strength  Deficits    Overall Strength Comments  abdominal  fair    Right Hip Flexion  4/5    Right Hip Extension  4-/5    Right Hip ABduction  3+/5    Left Hip Flexion  4/5    Left Hip Extension  3/5    Left Hip ABduction  3/5    Right Knee Flexion  4+/5    Right Knee Extension  4+/5    Left Knee Flexion  4-/5    Left Knee Extension  4-/5      Flexibility   Soft Tissue Assessment /Muscle Length  yes    Hamstrings  LT 60 degrees  RT 65      Palpation   Patella mobility  hypermobilty of LT knee patella    two past dislocations   Spinal mobility  tenderness over T-12 to L4    Palpation comment  tenderness over mid back and over bil QL and over iliac crests  LT > RT       Special Tests    Special Tests  Lumbar      Slump test   Findings  Negative    Comment  bil      Straight Leg Raise   Findings   Negative    Comment  bil          Access Code: [email or web address]: [email or web address]: 06/01/2021Prepared by: Donnetta Simpers BeardsleyExercises  Supine Transversus Abdominis Bracing - Hands on Stomach - 1 x daily - 7 x weekly - 2 sets - 10 reps  Supine Pelvic Tilt - 1 x daily - 7 x weekly - 2 sets - 10 reps  Supine Single Knee to Chest Stretch - 1 x daily - 7 x weekly - 1 sets - 5 reps - 10 hold  Supine Lower Trunk Rotation - 1 x daily - 7 x weekly - 1 sets - 5 reps - 15 hold  Supine Bridge - 1 x daily - 7 x weekly - 10 reps - 3 sets  Supine Hamstring Stretch with Strap - 1 x daily - 7 x weekly - 10 reps - 3 sets  Supine Straight Leg Raises - 1 x daily - 7 x weekly - 10 reps - 3 sets          Objective measurements completed on examination: See above findings.              PT Education - 04/13/20 1235    Education Details  POC Explanation of findings  initial HEP for back stretches and SLR /hamstring stretch for knee/back    Person(s) Educated  Patient    Methods  Explanation;Demonstration;Tactile cues;Verbal cues;Handout    Comprehension  Verbalized understanding;Returned demonstration       PT Short Term Goals - 04/13/20 1056      PT SHORT TERM GOAL #1   Title  Pt will be independent with basic back and knee HEP    Baseline  no knowledge    Time  3    Period  Weeks    Status  New    Target Date  05/04/20      PT SHORT TERM GOAL #2   Title  Pt will be able to squat with pain 5/10 or less and able to squat with femur parallel to ground    Baseline  Pain is 8/10 and unable to squat fully    Time  3    Period  Weeks    Status  New    Target Date  05/04/20      PT SHORT TERM GOAL #3   Title  Pt will be able to pick up 18 month old (25 lb) from crib without pain gretera than 5/10 and using good body mechanics    Baseline  Pt pain  8/10 to 10/10 when lifting    Time  3    Period  Weeks    Status  New    Target Date  05/04/20        PT Long Term  Goals - 04/13/20 1240      PT LONG TERM GOAL #1   Title  Demonstrate and verbalize techniques to reduce the risk of re-injury including: lifting, posture, body mechanics.    Baseline  Pt with no knowledge of back body mechanics and uses compensations due to LT knee pain/old dislocations/ post partum 8 months    Time  8    Period  Weeks    Status  New    Target Date  06/08/20      PT LONG TERM GOAL #2   Title  Pt will be independent with advanced HEP for core and LE strength /functional strength    Baseline  pt with 3-/5  4-/5 core and proximal hip strength    Time  8    Period  Weeks    Status  New      PT LONG TERM GOAL #3   Title  Pt will tolerate sitting 1 hour in order to work at computer for home work situation and be able to bend knee with pain 3/10 or less    Baseline  back and knee pain  8/9  out of 10 and cannot tolerate more than 5 minutes    Time  8    Period  Weeks    Status  New      PT LONG TERM GOAL #4   Title  Pt will demonstrate proper squatting techniques in order to get up and down from ground to care for 77 month old child as he begins crawling etc    Baseline  unable to squat without knee pain and unable to squat greater than 60 degrees hip flexion    Time  8    Period  Weeks    Status  New    Target Date  06/08/20      PT LONG TERM GOAL #5   Title  Pt will be able to demonstrate floor to mat transfers without exacerbating LT knee or back pain in order to care for children and perform household chores    Baseline  Pt unable to bend RT knee without pain and end range and back pain due to decreased core strength    Period  Weeks    Status  New    Target Date  06/08/20             Plan - 04/13/20 1257    Clinical Impression Statement  29 yo 8 months post partum enters clinic with c/o of bil low back pain RT>LT and old LT patellar dislocation when she was 7th grade and in high school  She has never felt like her knees were strong.  she has hypermobility  of LT knee patillar.  She now would like to get stronger.  She was in a job in which she was carrying 20-30 lb but now she is home on computer since COVID. Pt is deconditioned and would benefit from a basic back/post partum core strengthening as well as LE strengthening.  She would benefit from skilled PT to address impairments and eventually return to jobs requiring lifting and caring for her growing children and their needs    Examination-Activity Limitations  Bend;Squat;Lift;Sit;Stand    Stability/Clinical Decision Making  Stable/Uncomplicated  Clinical Decision Making  Low    Rehab Potential  Good    PT Frequency  2x / week   1st authorization eval plus 3 visits in 3 weeks for STG   PT Duration  8 weeks    PT Treatment/Interventions  Joint Manipulations;Spinal Manipulations;Taping;Dry needling;Manual techniques;Passive range of motion;Patient/family education;Neuromuscular re-education;Functional mobility training;Therapeutic activities;Therapeutic exercise;Ultrasound;Moist Heat;Iontophoresis 4mg /ml Dexamethasone;Electrical Stimulation;Cryotherapy;ADLs/Self Care Home Management    PT Next Visit Plan  iontophoresis,  possible taping of RT knee.  Check SI ,  Review HEP and add to knee strength    PT Home Exercise Plan  [email or web address]    Consulted and Agree with Plan of Care  Patient       Patient will benefit from skilled therapeutic intervention in order to improve the following deficits and impairments:  Obesity, Pain, Improper body mechanics, Postural dysfunction, Increased muscle spasms, Hypermobility, Decreased strength, Decreased range of motion, Decreased mobility  Visit Diagnosis: Chronic low back pain with left-sided sciatica, unspecified back pain laterality  Chronic pain of left knee  Muscle weakness (generalized)     Problem List Patient Active Problem List   Diagnosis Date Noted  . Lumbar pain 03/23/2020  . Chronic pain of left knee 03/23/2020  . Dyshidrotic eczema  12/18/2019  . Hemorrhoids 10/15/2019  . Post term pregnancy at [redacted] weeks gestation 08/13/2019  . Herpes infection 03/20/2019  . Sickle cell trait (HCC) 01/05/2019    01/07/2019, PT Certified Exercise Expert for the Aging Adult  04/13/20 1:07 PM Phone: [phone] Fax: [phone]  [facility] Outpatient Rehabilitation [facility] [address] [place], [place], [place] Phone: [phone]   Fax:  [phone]  Name: JONNA DITTRICH MRN: [name] Date of Birth: 1991/03/04

## 2020-04-14 IMAGING — US US OB < 14 WEEKS - US OB TV
1 series · 15 of 28 positions shown · non-contrast
Comparison: None.

CLINICAL DATA: Pain.  Pregnant patient.

EXAM:
OBSTETRIC <14 WK US AND TRANSVAGINAL OB US
TECHNIQUE: Both transabdominal and transvaginal ultrasound examinations were
performed for complete evaluation of the gestation as well as the
maternal uterus, adnexal regions, and pelvic cul-de-sac.
Transvaginal technique was performed to assess early pregnancy.

[Series 1: us ob < 14 weeks - us ob tv · 15 of 66 slices shown]
[im 1/66]
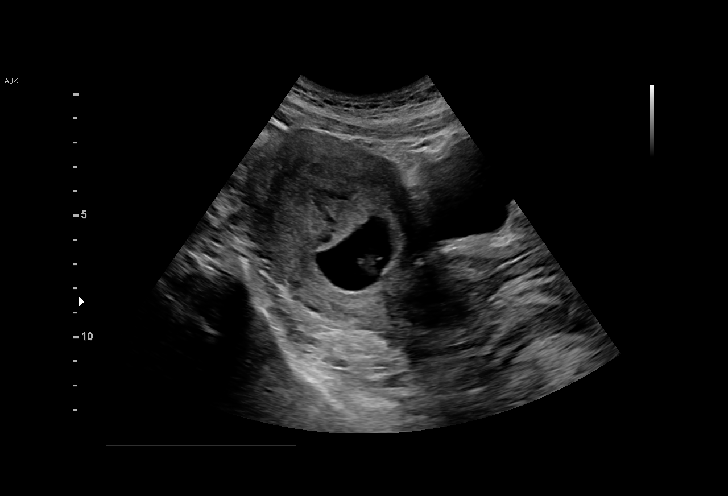
[im 5/66]
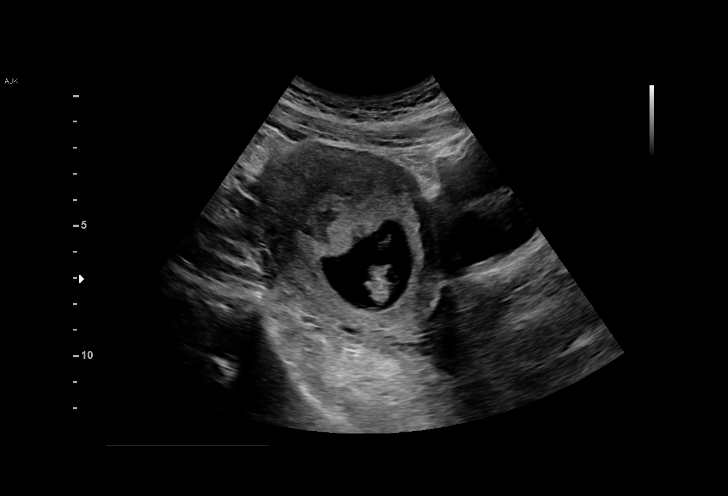
[im 10/66]
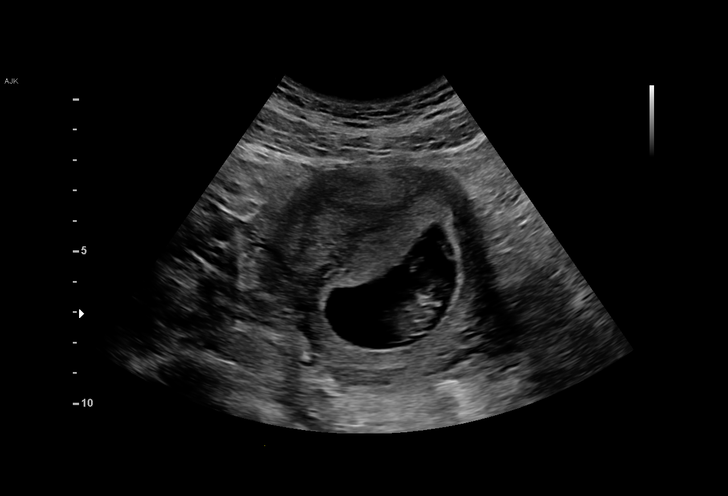
[im 15/66]
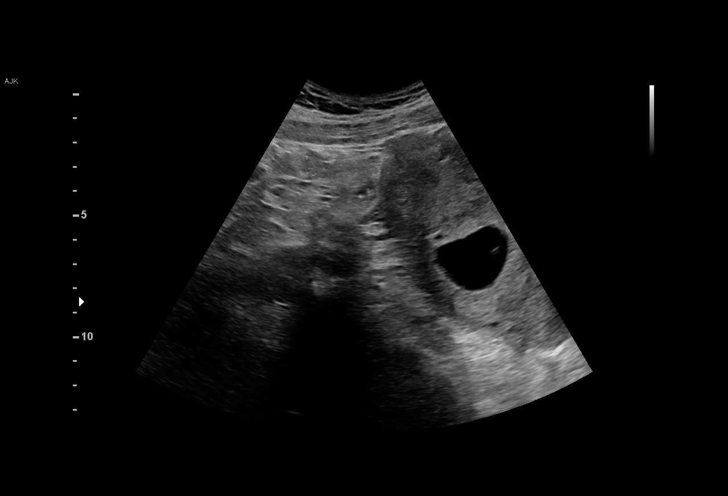
[im 20/66]
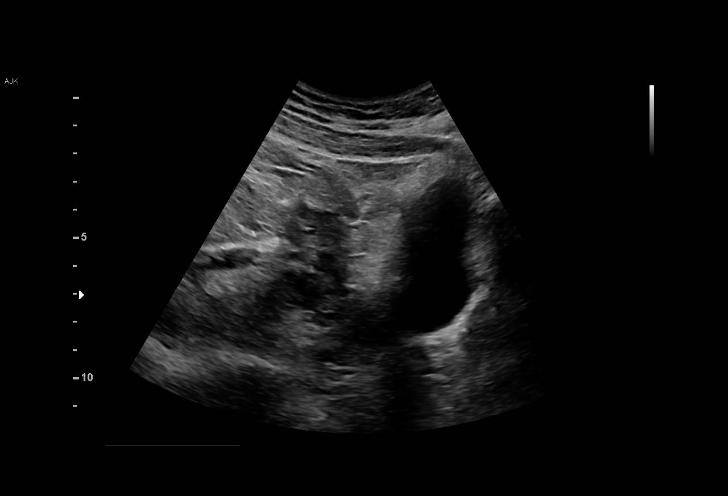
[im 25/66]
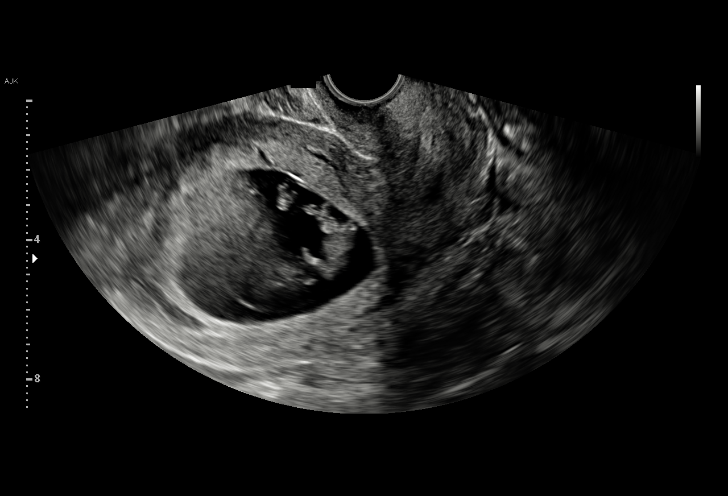
[im 29/66]
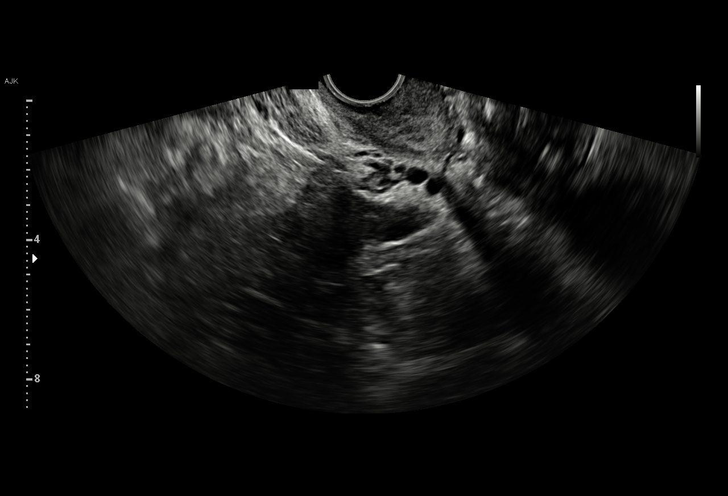
[im 34/66]
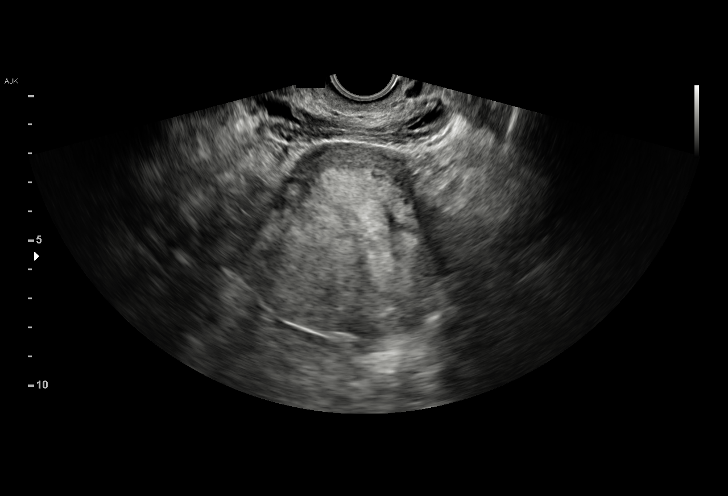
[im 37/66]
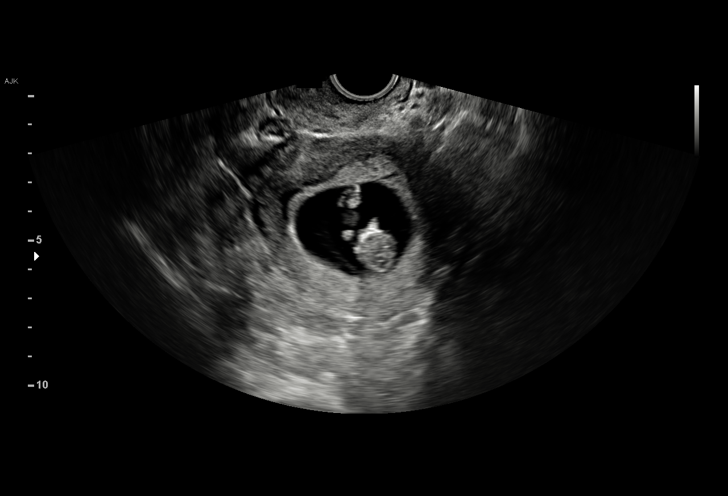
[im 41/66]
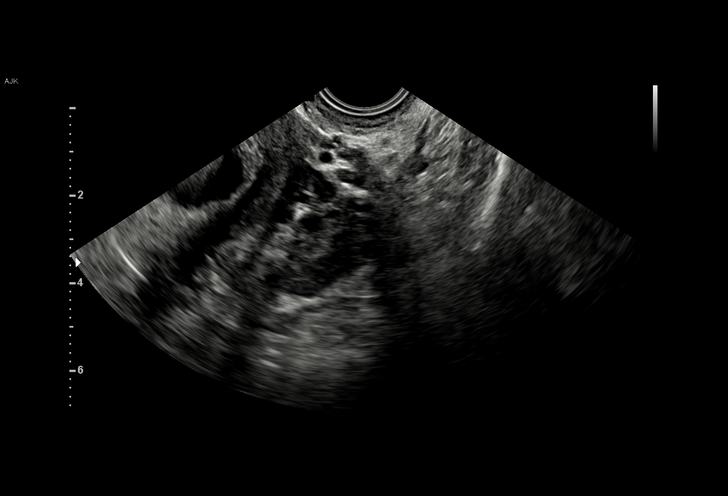
[im 46/66]
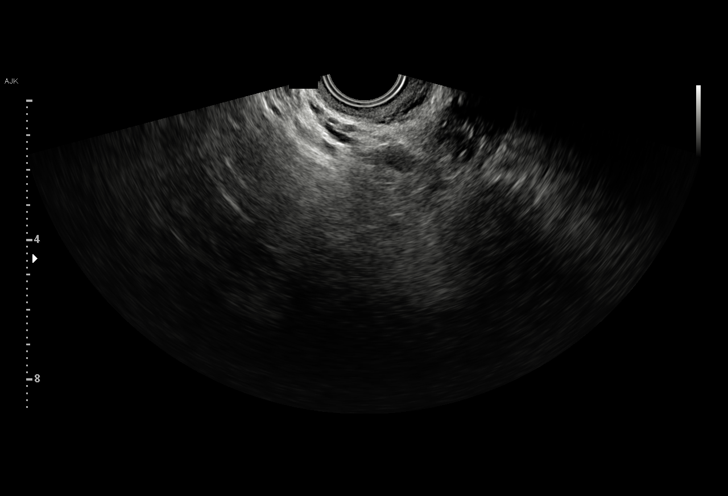
[im 51/66]
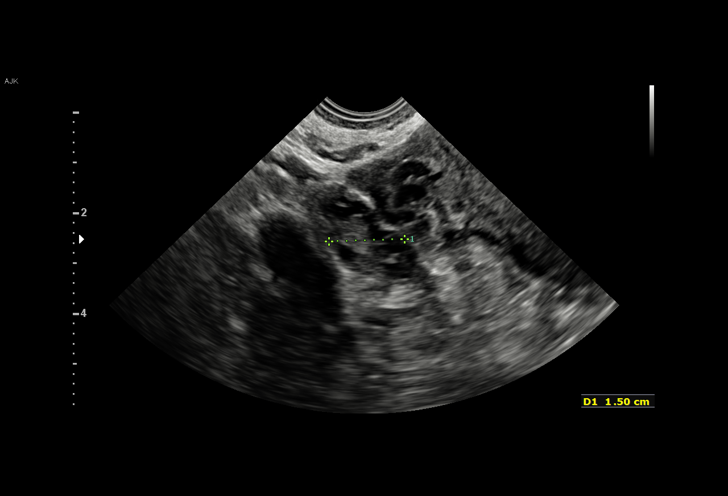
[im 56/66]
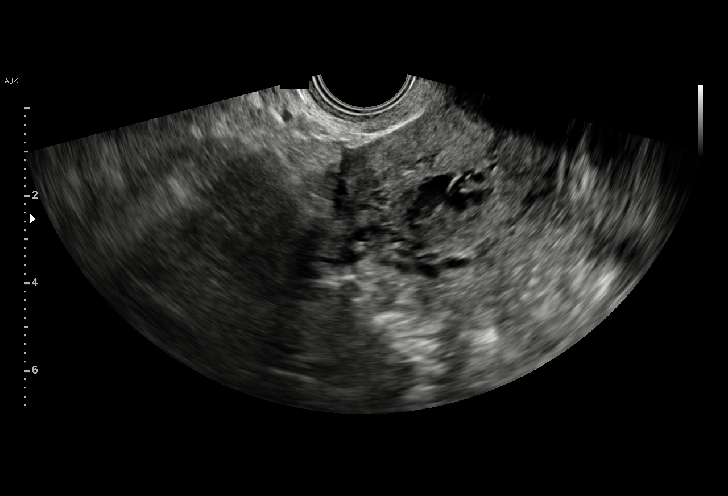
[im 61/66]
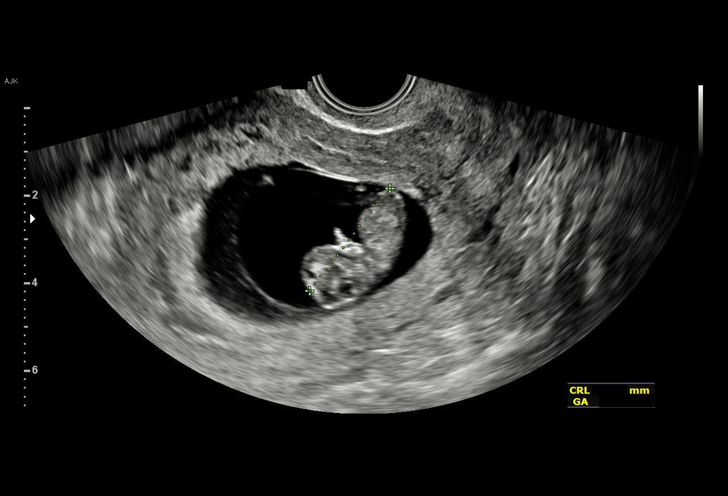
[im 66/66]
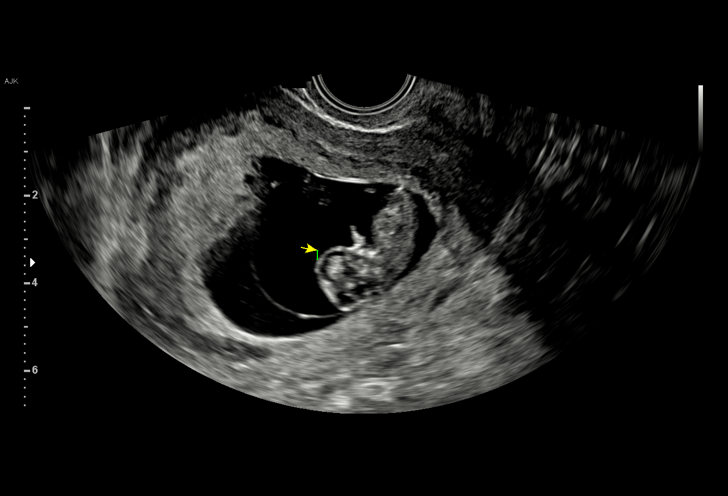

[15 of 28 positions shown; findings below may reference images not displayed]

FINDINGS: Intrauterine gestational sac: Single

Yolk sac:  Visualized.

Embryo:  Visualized.

Cardiac Activity: Visualized.

Heart Rate: 154 bpm

MSD:   mm    w     d

CRL: 30.2 mm 9 w 6 d US EDC: August 05, 2019

Subchorionic hemorrhage:  None visualized.

Maternal uterus/adnexae: The ovaries are normal in appearance with a
corpus luteum cyst on the left.
IMPRESSION: Normal study.  No cause for pain identified.

## 2020-04-22 ENCOUNTER — Other Ambulatory Visit: Payer: Self-pay

## 2020-04-22 ENCOUNTER — Ambulatory Visit: Payer: Medicaid Other | Admitting: Physical Therapy

## 2020-04-22 DIAGNOSIS — M25562 Pain in left knee: Secondary | ICD-10-CM | POA: Diagnosis not present

## 2020-04-22 DIAGNOSIS — M6281 Muscle weakness (generalized): Secondary | ICD-10-CM | POA: Diagnosis not present

## 2020-04-22 DIAGNOSIS — G8929 Other chronic pain: Secondary | ICD-10-CM

## 2020-04-22 DIAGNOSIS — M5442 Lumbago with sciatica, left side: Secondary | ICD-10-CM | POA: Diagnosis not present

## 2020-04-22 NOTE — Therapy (Signed)
[facility] Outpatient Rehabilitation [facility] [address] [place], [place], [address] Phone: [phone]   Fax:  [phone]  Physical Therapy Treatment  Patient Details  Name: Alicia Dunlap MRN: [id number] Date of Birth: 03-08-1991 Referring Provider (PT): [name]   Encounter Date: 04/22/2020   PT End of Session - 04/22/20 0918    Visit Number 2    Number of Visits 16    Date for PT Re-Evaluation 06/08/20    Authorization Type MCD  1st authorization submitted 04-13-20 for 3 visits until 05-04-20    Authorization - Visit Number 2    Authorization - Number of Visits 4    PT Start Time [phone]    PT Stop Time 1000    PT Time Calculation (min) 42 min    Activity Tolerance Patient tolerated treatment well    Behavior During Therapy [facility] for tasks assessed/performed           Past Medical History:  Diagnosis Date  . Bacterial vaginosis   . DENTAL CARIES 08/11/2010   Qualifier: Diagnosis of  By: [place] MD, Sarah    . Drug abuse, marijuana 09/06/2012  . High risk sexual behavior 02/12/2013   Multiple STD's checks: 8 since 2011.   . Tobacco abuse 06/11/2017    Past Surgical History:  Procedure Laterality Date  . NO PAST SURGERIES      There were no vitals filed for this visit.   Subjective Assessment - 04/22/20 0921    Subjective Pt reports that she had her tooth pulled and so she's been too out of it to work on the exercises. Pt reports pain has been bad the last few days likely due to poor sleeping position after her tooth extraction.    Pertinent History lumbar pain and LT knee disclocation. Herpes    Limitations Sitting;Standing;Walking;Lifting   29 yo and 8 month baby   How long can you sit comfortably? < 5 min  I cant sit and bend knee and I cant hang legs off bar stool    How long can you stand comfortably? 15 - 20 minutes    How long can you walk comfortably? walk for and hour    Diagnostic tests not recently    Patient Stated Goals trying  to get my knee better and back better, I want to work outside the home and be prepared to lift    Currently in Pain? Yes    Pain Score 8     Pain Location Back    Pain Onset More than a month ago    Pain Onset More than a month ago                             [facility] Adult PT Treatment/Exercise - 04/22/20 0001      Lumbar Exercises: Stretches   Passive Hamstring Stretch 60 seconds;Right;Left    Pelvic Tilt 5 reps    Piriformis Stretch Right;Left;60 seconds      Lumbar Exercises: Aerobic   Nustep L6 x 5 min      Lumbar Exercises: Supine   Clam 10 reps;2 seconds    Bridge 10 reps;Compliant    Straight Leg Raise 10 reps      Modalities   Modalities Electrical Stimulation;Moist Heat      Moist Heat Therapy   Number Minutes Moist Heat 10 Minutes    Moist Heat Location Lumbar Spine      Electrical  Stimulation   Electrical Stimulation Location Paraspinals    Electrical Stimulation Action IFC    Electrical Stimulation Parameters to pt tolerance    Electrical Stimulation Goals Pain;Tone                    PT Short Term Goals - 04/13/20 1056      PT SHORT TERM GOAL #1   Title Pt will be independent with basic back and knee HEP    Baseline no knowledge    Time 3    Period Weeks    Status New    Target Date 05/04/20      PT SHORT TERM GOAL #2   Title Pt will be able to squat with pain 5/10 or less and able to squat with femur parallel to ground    Baseline Pain is 8/10 and unable to squat fully    Time 3    Period Weeks    Status New    Target Date 05/04/20      PT SHORT TERM GOAL #3   Title Pt will be able to pick up 67 month old (25 lb) from crib without pain gretera than 5/10 and using good body mechanics    Baseline Pt pain 8/10 to 10/10 when lifting    Time 3    Period Weeks    Status New    Target Date 05/04/20             PT Long Term Goals - 04/13/20 1240      PT LONG TERM GOAL #1   Title Demonstrate and verbalize  techniques to reduce the risk of re-injury including: lifting, posture, body mechanics.    Baseline Pt with no knowledge of back body mechanics and uses compensations due to LT knee pain/old dislocations/ post partum 8 months    Time 8    Period Weeks    Status New    Target Date 06/08/20      PT LONG TERM GOAL #2   Title Pt will be independent with advanced HEP for core and LE strength /functional strength    Baseline pt with 3-/5  4-/5 core and proximal hip strength    Time 8    Period Weeks    Status New      PT LONG TERM GOAL #3   Title Pt will tolerate sitting 1 hour in order to work at computer for home work situation and be able to bend knee with pain 3/10 or less    Baseline back and knee pain  8/9  out of 10 and cannot tolerate more than 5 minutes    Time 8    Period Weeks    Status New      PT LONG TERM GOAL #4   Title Pt will demonstrate proper squatting techniques in order to get up and down from ground to care for 51 month old child as he begins crawling etc    Baseline unable to squat without knee pain and unable to squat greater than 60 degrees hip flexion    Time 8    Period Weeks    Status New    Target Date 06/08/20      PT LONG TERM GOAL #5   Title Pt will be able to demonstrate floor to mat transfers without exacerbating LT knee or back pain in order to care for children and perform household chores    Baseline Pt unable to bend RT knee without pain  and end range and back pain due to decreased core strength    Period Weeks    Status New    Target Date 06/08/20                 Plan - 04/22/20 1001    Clinical Impression Statement Pt complains mostly of LBP this session; therefore, treatment focused primarily on core and back stabilization, strengthening and stretching. Educated and discussed sleeping positions/body mechanics that will stress back less. Pt with ongoing progress.    Examination-Activity Limitations Bend;Squat;Lift;Sit;Stand     Stability/Clinical Decision Making Stable/Uncomplicated    Rehab Potential Good    PT Frequency 2x / week   1st authorization eval plus 3 visits in 3 weeks for STG   PT Duration 8 weeks    PT Treatment/Interventions Joint Manipulations;Spinal Manipulations;Taping;Dry needling;Manual techniques;Passive range of motion;Patient/family education;Neuromuscular re-education;Functional mobility training;Therapeutic activities;Therapeutic exercise;Ultrasound;Moist Heat;Iontophoresis 4mg /ml Dexamethasone;Electrical Stimulation;Cryotherapy;ADLs/Self Care Home Management    PT Next Visit Plan Continue to progress core and back strengthening. Increase focus on right knee strengthening; consider possible taping of RT knee.  Check SI , Review HEP and add to knee strength    PT Home Exercise Plan [id number]    Consulted and Agree with Plan of Care Patient           Patient will benefit from skilled therapeutic intervention in order to improve the following deficits and impairments:  Obesity, Pain, Improper body mechanics, Postural dysfunction, Increased muscle spasms, Hypermobility, Decreased strength, Decreased range of motion, Decreased mobility  Visit Diagnosis: Chronic low back pain with left-sided sciatica, unspecified back pain laterality  Muscle weakness (generalized)     Problem List Patient Active Problem List   Diagnosis Date Noted  . Lumbar pain 03/23/2020  . Chronic pain of left knee 03/23/2020  . Dyshidrotic eczema 12/18/2019  . Hemorrhoids 10/15/2019  . Post term pregnancy at [redacted] weeks gestation 08/13/2019  . Herpes infection 03/20/2019  . Sickle cell trait [facility]) 01/05/2019    Toyoko Silos April Ma L Chattaroy PT, DPT 04/22/2020, 11:28 AM  [facility] [address] [place], [place], [address] Phone: [phone]   Fax:  [phone]  Name: JESICA GOHEEN MRN: [id number] Date of Birth: [birth year]

## 2020-04-28 ENCOUNTER — Other Ambulatory Visit: Payer: Self-pay

## 2020-04-28 ENCOUNTER — Ambulatory Visit: Payer: Medicaid Other | Admitting: Physical Therapy

## 2020-04-28 DIAGNOSIS — M5442 Lumbago with sciatica, left side: Secondary | ICD-10-CM | POA: Diagnosis not present

## 2020-04-28 DIAGNOSIS — M25562 Pain in left knee: Secondary | ICD-10-CM

## 2020-04-28 DIAGNOSIS — M6281 Muscle weakness (generalized): Secondary | ICD-10-CM

## 2020-04-28 DIAGNOSIS — G8929 Other chronic pain: Secondary | ICD-10-CM | POA: Diagnosis not present

## 2020-04-28 NOTE — Therapy (Signed)
[facility] Outpatient Rehabilitation [facility] [address] [place], [place], [address] Phone: [phone]   Fax:  [phone]  Physical Therapy Treatment  Patient Details  Name: Alicia Dunlap MRN: [id number] Date of Birth: February 15, 1991 Referring Provider (PT): [name]   Encounter Date: 04/28/2020   PT End of Session - 04/28/20 1053    Visit Number 3    Number of Visits 16    Date for PT Re-Evaluation 06/08/20    Authorization Type MCD  1st authorization submitted 04-13-20 for 3 visits until 05-04-20    Authorization - Visit Number 3    Authorization - Number of Visits 4    PT Start Time 1054    PT Stop Time 1145    PT Time Calculation (min) 51 min    Activity Tolerance Patient tolerated treatment well    Behavior During Therapy [facility] for tasks assessed/performed           Past Medical History:  Diagnosis Date  . Bacterial vaginosis   . DENTAL CARIES 08/11/2010   Qualifier: Diagnosis of  By: [place] MD, Sarah    . Drug abuse, marijuana 09/06/2012  . High risk sexual behavior 02/12/2013   Multiple STD's checks: 8 since 2011.   . Tobacco abuse 06/11/2017    Past Surgical History:  Procedure Laterality Date  . NO PAST SURGERIES      There were no vitals filed for this visit.   Subjective Assessment - 04/28/20 1056    Subjective Pt reports increased back pain this morning. Pt reports performing exercises intermittently due to limited time with work and family. Pt states she has been sleeping with one pillow.    Pertinent History lumbar pain and LT knee disclocation. Herpes    Limitations Sitting;Standing;Walking;Lifting   29 yo and 8 month baby   How long can you sit comfortably? < 5 min  I cant sit and bend knee and I cant hang legs off bar stool    How long can you stand comfortably? 15 - 20 minutes    How long can you walk comfortably? walk for and hour    Diagnostic tests not recently    Patient Stated Goals trying to get my knee better and  back better, I want to work outside the home and be prepared to lift    Currently in Pain? Yes    Pain Score 8     Pain Location Back    Pain Onset More than a month ago    Pain Onset More than a month ago                             [facility] Adult PT Treatment/Exercise - 04/28/20 0001      Lumbar Exercises: Stretches   Other Lumbar Stretch Exercise Child's pose x 30 sec with Left & Rt side bend x 30 sec each    Other Lumbar Stretch Exercise Cat/cow x 10 reps with 2 sec hold      Lumbar Exercises: Aerobic   Nustep L6 x 5 min      Lumbar Exercises: Standing   Functional Squats 10 reps    Functional Squats Limitations 8# weight    Wall Slides 10 reps      Lumbar Exercises: Supine   Bridge 10 reps;Compliant    Bridge with March 10 reps    Straight Leg Raise 10 reps      Modalities   Modalities  Electrical Stimulation;Moist Heat      Moist Heat Therapy   Number Minutes Moist Heat 10 Minutes    Moist Heat Location Lumbar Spine      Electrical Stimulation   Electrical Stimulation Location Paraspinals    Electrical Stimulation Action IFC    Electrical Stimulation Parameters to pt tolerance    Electrical Stimulation Goals Tone;Pain      Manual Therapy   Manual Therapy Joint mobilization;Soft tissue mobilization    Joint Mobilization PA thoracolumbar    Soft tissue mobilization thoracolumbar paraspinals                  PT Education - 04/28/20 1137    Education Details Discussed proper squat and using core to stabilize. Discussed squatting to lift child vs bending over    Person(s) Educated Patient    Methods Explanation;Demonstration;Tactile cues;Verbal cues;Handout    Comprehension Verbalized understanding;Returned demonstration            PT Short Term Goals - 04/13/20 1056      PT SHORT TERM GOAL #1   Title Pt will be independent with basic back and knee HEP    Baseline no knowledge    Time 3    Period Weeks    Status New    Target  Date 05/04/20      PT SHORT TERM GOAL #2   Title Pt will be able to squat with pain 5/10 or less and able to squat with femur parallel to ground    Baseline Pain is 8/10 and unable to squat fully    Time 3    Period Weeks    Status New    Target Date 05/04/20      PT SHORT TERM GOAL #3   Title Pt will be able to pick up 70 month old (25 lb) from crib without pain gretera than 5/10 and using good body mechanics    Baseline Pt pain 8/10 to 10/10 when lifting    Time 3    Period Weeks    Status New    Target Date 05/04/20             PT Long Term Goals - 04/13/20 1240      PT LONG TERM GOAL #1   Title Demonstrate and verbalize techniques to reduce the risk of re-injury including: lifting, posture, body mechanics.    Baseline Pt with no knowledge of back body mechanics and uses compensations due to LT knee pain/old dislocations/ post partum 8 months    Time 8    Period Weeks    Status New    Target Date 06/08/20      PT LONG TERM GOAL #2   Title Pt will be independent with advanced HEP for core and LE strength /functional strength    Baseline pt with 3-/5  4-/5 core and proximal hip strength    Time 8    Period Weeks    Status New      PT LONG TERM GOAL #3   Title Pt will tolerate sitting 1 hour in order to work at computer for home work situation and be able to bend knee with pain 3/10 or less    Baseline back and knee pain  8/9  out of 10 and cannot tolerate more than 5 minutes    Time 8    Period Weeks    Status New      PT LONG TERM GOAL #4   Title Pt will  demonstrate proper squatting techniques in order to get up and down from ground to care for 81 month old child as he begins crawling etc    Baseline unable to squat without knee pain and unable to squat greater than 60 degrees hip flexion    Time 8    Period Weeks    Status New    Target Date 06/08/20      PT LONG TERM GOAL #5   Title Pt will be able to demonstrate floor to mat transfers without exacerbating LT  knee or back pain in order to care for children and perform household chores    Baseline Pt unable to bend RT knee without pain and end range and back pain due to decreased core strength    Period Weeks    Status New    Target Date 06/08/20                 Plan - 04/28/20 1138    Clinical Impression Statement Pt complains mostly of LBP. Treatment focused on progressing core and back stabilization exercises and manual therapy. Discussed proper form to lift child to reduce back pain/pressure.    Examination-Activity Limitations Bend;Squat;Lift;Sit;Stand    Stability/Clinical Decision Making Stable/Uncomplicated    Clinical Decision Making Low    Rehab Potential Good    PT Frequency 2x / week   1st authorization eval plus 3 visits in 3 weeks for STG   PT Duration 8 weeks    PT Treatment/Interventions Joint Manipulations;Spinal Manipulations;Taping;Dry needling;Manual techniques;Passive range of motion;Patient/family education;Neuromuscular re-education;Functional mobility training;Therapeutic activities;Therapeutic exercise;Ultrasound;Moist Heat;Iontophoresis 4mg /ml Dexamethasone;Electrical Stimulation;Cryotherapy;ADLs/Self Care Home Management    PT Next Visit Plan Continue to progress core and back strengthening. Increase focus on right knee strengthening; consider possible taping of RT knee.  Review HEP and add to knee strength    PT Home Exercise Plan [id number]    Consulted and Agree with Plan of Care Patient           Patient will benefit from skilled therapeutic intervention in order to improve the following deficits and impairments:  Obesity, Pain, Improper body mechanics, Postural dysfunction, Increased muscle spasms, Hypermobility, Decreased strength, Decreased range of motion, Decreased mobility  Visit Diagnosis: Chronic low back pain with left-sided sciatica, unspecified back pain laterality  Muscle weakness (generalized)  Chronic pain of left knee     Problem  List Patient Active Problem List   Diagnosis Date Noted  . Lumbar pain 03/23/2020  . Chronic pain of left knee 03/23/2020  . Dyshidrotic eczema 12/18/2019  . Hemorrhoids 10/15/2019  . Post term pregnancy at [redacted] weeks gestation 08/13/2019  . Herpes infection 03/20/2019  . Sickle cell trait [facility]) 01/05/2019    Alicia Dunlap PT, DPT 04/28/2020, 1:32 PM  [facility] [address] [place], [place], [address] Phone: [phone]   Fax:  [phone]  Name: Alicia Dunlap MRN: [id number] Date of Birth: 1991/08/22

## 2020-05-04 ENCOUNTER — Telehealth: Payer: Self-pay | Admitting: Physical Therapy

## 2020-05-04 ENCOUNTER — Ambulatory Visit: Payer: Medicaid Other | Admitting: Physical Therapy

## 2020-05-04 NOTE — Telephone Encounter (Signed)
Pt called to ask about not showing for scheduled appt at 11:45 on 05-04-20.  Pt told PT she cancelled appt for today (at 11:48) and rescheduled for tomorrow at 11:45.  Pt states she will come to appt scheduled for 11:45 on 05-05-20  Garen Lah, PT Certified Exercise Expert for the Aging Adult  05/04/20 12:12 PM Phone: [phone] Fax: [phone]

## 2020-05-05 ENCOUNTER — Other Ambulatory Visit: Payer: Self-pay

## 2020-05-05 ENCOUNTER — Ambulatory Visit: Payer: Medicaid Other | Admitting: Physical Therapy

## 2020-05-05 DIAGNOSIS — M5442 Lumbago with sciatica, left side: Secondary | ICD-10-CM | POA: Diagnosis not present

## 2020-05-05 DIAGNOSIS — M6281 Muscle weakness (generalized): Secondary | ICD-10-CM

## 2020-05-05 DIAGNOSIS — G8929 Other chronic pain: Secondary | ICD-10-CM

## 2020-05-05 DIAGNOSIS — M25562 Pain in left knee: Secondary | ICD-10-CM | POA: Diagnosis not present

## 2020-05-05 NOTE — Therapy (Addendum)
[place], [place], [address] Phone: [phone]   Fax:  [phone]  Physical Therapy Treatment/Re-Certification and Discharge  Patient Details  Name: Alicia Dunlap MRN: [id number] Date of Birth: 1991/05/16 Referring Provider (PT): Madison Hickman MD   Encounter Date: 05/05/2020   PT End of Session - 05/05/20 1231    Visit Number 4    Number of Visits 16    Date for PT Re-Evaluation 06/08/20    Authorization Type MCD Re-auth requested    Authorization - Visit Number 4    Authorization - Number of Visits 4    PT Start Time 9794    PT Stop Time 1225    PT Time Calculation (min) 50 min    Activity Tolerance Patient tolerated treatment well    Behavior During Therapy [facility] for tasks assessed/performed           Past Medical History:  Diagnosis Date  . Bacterial vaginosis   . DENTAL CARIES 08/11/2010   Qualifier: Diagnosis of  By: [place] MD, Sarah    . Drug abuse, marijuana 09/06/2012  . High risk sexual behavior 02/12/2013   Multiple STD's checks: 8 since 2011.   . Tobacco abuse 06/11/2017    Past Surgical History:  Procedure Laterality Date  . NO PAST SURGERIES      There were no vitals filed for this visit.   Subjective Assessment - 05/05/20 1138    Subjective Pt reports she has been doing the squatting at home. She notes knee pain but back pain has been okay.    Pertinent History lumbar pain and LT knee disclocation. Herpes    Limitations Sitting;Standing;Walking;Lifting   29 yo and 8 month baby   How long can you sit comfortably? < 5 min  I cant sit and bend knee and I cant hang legs off bar stool    How long can you stand comfortably? 15 - 20 minutes    How long can you walk comfortably? walk for and hour    Diagnostic tests not recently    Patient Stated Goals trying to get my knee better and back better, I want to work outside the home and be prepared to lift    Currently in Pain? Yes      Pain Score 8     Pain Location Back    Pain Type Chronic pain    Pain Onset More than a month ago    Pain Score 5    Pain Location Knee    Pain Orientation Left    Pain Onset More than a month ago                             [facility] Adult PT Treatment/Exercise - 05/05/20 0001      Lumbar Exercises: Aerobic   Nustep L6 x 5 min      Lumbar Exercises: Supine   Other Supine Lumbar Exercises Bent leg raises x 10 reps      Lumbar Exercises: Prone   Other Prone Lumbar Exercises Planking 5 reps x 10 sec on elbows and knees      Knee/Hip Exercises: Supine   Other Supine Knee/Hip Exercises Leg press 80 lbs x 8 reps      Knee/Hip Exercises: Sidelying   Other Sidelying Knee/Hip Exercises side planks bilat 5 sets x 10 reps  PT Short Term Goals - 05/05/20 1141      PT SHORT TERM GOAL #1   Title Pt will be independent with basic back and knee HEP    Baseline Limited    Time 6    Period Weeks    Status Revised    Target Date 05/04/20      PT SHORT TERM GOAL #2   Title Pt will be able to squat with pain 5/10 or less and able to squat with femur parallel to ground    Baseline 9/10 pain in knee, no pain in back    Time 6    Period Weeks    Status Revised    Target Date 06/16/20      PT SHORT TERM GOAL #3   Title Pt will be able to pick up child old (35 lb) from crib without pain gretera than 5/10 and using good body mechanics    Baseline 10/10 knee and back pain when lifting, 10/10 back pain when lifting    Time 6    Period Weeks    Status Revised    Target Date 06/16/20             PT Long Term Goals - 05/05/20 1229      PT LONG TERM GOAL #1   Title Demonstrate and verbalize techniques to reduce the risk of re-injury including: lifting, posture, body mechanics.    Baseline Pt with no knowledge of back body mechanics and uses compensations due to LT knee pain/old dislocations/ post partum 8 months    Time 6    Period Weeks     Status On-going    Target Date 06/16/20      PT LONG TERM GOAL #2   Title Pt will be independent with advanced HEP for core and LE strength /functional strength    Baseline pt with 3-/5  4-/5 core and proximal hip strength    Time 6    Period Weeks    Status On-going    Target Date 06/16/20      PT LONG TERM GOAL #3   Title Pt will tolerate sitting 1 hour in order to work at computer for home work situation and be able to bend knee with pain 3/10 or less    Baseline back and knee pain  8/9  out of 10 and cannot tolerate more than 5 minutes    Time 6    Period Weeks    Status On-going    Target Date 06/16/20      PT LONG TERM GOAL #4   Title Pt will demonstrate proper squatting techniques in order to get up and down from ground to care for 29 month old child as he begins crawling etc    Baseline unable to squat without knee pain and unable to squat greater than 60 degrees hip flexion    Time 6    Period Weeks    Status Partially Met    Target Date 06/16/20      PT LONG TERM GOAL #5   Title Pt will be able to demonstrate floor to mat transfers without exacerbating LT knee or back pain in order to care for children and perform household chores    Baseline Pt unable to bend RT knee without pain and end range and back pain due to decreased core strength    Time 6    Period Weeks    Status On-going    Target Date 06/16/20  Plan - 05/05/20 1209    Clinical Impression Statement Pt with increased L knee pain this session. PT provided ice and taping to address knee pain. Pt with difficulty isolating TSA contraction -- PT treatment focused on general core strengthening with planks and continued to progress squatting. Pt's goals continue to be ongoing and she would continue to benefit from therapy services; however, pt is demonstrating improved form and body mechanics with squats. Squatting without weight is no longer painful to her back. Goals adjusted accordingly.     Examination-Activity Limitations Bend;Squat;Lift;Sit;Stand    Stability/Clinical Decision Making Stable/Uncomplicated    Rehab Potential Good    PT Frequency 2x / week   1st authorization eval plus 3 visits in 3 weeks for STG   PT Duration 8 weeks    PT Treatment/Interventions Joint Manipulations;Spinal Manipulations;Taping;Dry needling;Manual techniques;Passive range of motion;Patient/family education;Neuromuscular re-education;Functional mobility training;Therapeutic activities;Therapeutic exercise;Ultrasound;Moist Heat;Iontophoresis 35m/ml Dexamethasone;Electrical Stimulation;Cryotherapy;ADLs/Self Care Home Management    PT Next Visit Plan Continue to progress core and back strengthening. Consider planking, continued strengthening with squatting and lifting. Consider deadlifts. Consider xband walking.    PT Home Exercise Plan [id number]    Consulted and Agree with Plan of Care Patient           PHYSICAL THERAPY DISCHARGE SUMMARY  Visits from Start of Care: 4  Current functional level related to goals / functional outcomes: Pt with continued pain in back and knee. Increased strength and able to perform squats without pain; however, with weight pt continues to have pain.   Remaining deficits: See above   Education / Equipment: Discussed continuing to progress core stabilization  Plan:                                                    Patient goals were not met. Patient is being discharged due to not returning since the last visit.  ?????        Patient will benefit from skilled therapeutic intervention in order to improve the following deficits and impairments:  Obesity, Pain, Improper body mechanics, Postural dysfunction, Increased muscle spasms, Hypermobility, Decreased strength, Decreased range of motion, Decreased mobility  Visit Diagnosis: Chronic low back pain with left-sided sciatica, unspecified back pain laterality  Muscle weakness (generalized)  Chronic pain of  left knee     Problem List Patient Active Problem List   Diagnosis Date Noted  . Lumbar pain 03/23/2020  . Chronic pain of left knee 03/23/2020  . Dyshidrotic eczema 12/18/2019  . Hemorrhoids 10/15/2019  . Post term pregnancy at [redacted] weeks gestation 08/13/2019  . Herpes infection 03/20/2019  . Sickle cell trait ([facility] 01/05/2019    Lem Peary April Ma L Tressy Kunzman PT, DPT 05/05/2020, 12:33 PM  [facility]GD[address] N[place] [address]Phone: [phone]  Fax:  [phone] Name: Alicia BALGOBINMRN: [id number] of Birth: [birth year]

## 2020-05-13 DIAGNOSIS — Z419 Encounter for procedure for purposes other than remedying health state, unspecified: Secondary | ICD-10-CM | POA: Diagnosis not present

## 2020-05-20 ENCOUNTER — Telehealth: Payer: Self-pay | Admitting: Physical Therapy

## 2020-05-20 ENCOUNTER — Ambulatory Visit: Payer: Medicaid Other | Attending: Family Medicine | Admitting: Physical Therapy

## 2020-05-20 NOTE — Telephone Encounter (Signed)
Left message for pt in regards to her missed 2:45pm appointment. Message includes that pt has no other visits scheduled and if she wishes to continue therapy she would need to call and schedule.   Oney Tatlock April Dell Ponto, PT, DPT

## 2020-05-24 ENCOUNTER — Encounter: Payer: Self-pay | Admitting: Physical Therapy

## 2020-06-01 ENCOUNTER — Ambulatory Visit: Payer: Medicaid Other

## 2020-06-13 DIAGNOSIS — Z419 Encounter for procedure for purposes other than remedying health state, unspecified: Secondary | ICD-10-CM | POA: Diagnosis not present

## 2020-06-25 ENCOUNTER — Other Ambulatory Visit: Payer: Self-pay

## 2020-06-25 ENCOUNTER — Encounter (HOSPITAL_BASED_OUTPATIENT_CLINIC_OR_DEPARTMENT_OTHER): Payer: Self-pay

## 2020-06-25 ENCOUNTER — Ambulatory Visit (HOSPITAL_COMMUNITY)
Admission: EM | Admit: 2020-06-25 | Discharge: 2020-06-25 | Disposition: A | Discharge status: Home / Self Care | Payer: Medicaid Other | Attending: Family Medicine | Admitting: Family Medicine

## 2020-06-25 ENCOUNTER — Encounter (HOSPITAL_COMMUNITY): Payer: Self-pay

## 2020-06-25 ENCOUNTER — Emergency Department (HOSPITAL_BASED_OUTPATIENT_CLINIC_OR_DEPARTMENT_OTHER)
Admission: EM | Admit: 2020-06-25 | Discharge: 2020-06-25 | Disposition: A | Discharge status: Home / Self Care | Payer: Medicaid Other | Attending: Emergency Medicine | Admitting: Emergency Medicine

## 2020-06-25 ENCOUNTER — Emergency Department (HOSPITAL_BASED_OUTPATIENT_CLINIC_OR_DEPARTMENT_OTHER): Payer: Medicaid Other

## 2020-06-25 DIAGNOSIS — R102 Pelvic and perineal pain: Secondary | ICD-10-CM

## 2020-06-25 DIAGNOSIS — R1031 Right lower quadrant pain: Secondary | ICD-10-CM | POA: Diagnosis not present

## 2020-06-25 DIAGNOSIS — Z87891 Personal history of nicotine dependence: Secondary | ICD-10-CM | POA: Diagnosis not present

## 2020-06-25 DIAGNOSIS — Z3202 Encounter for pregnancy test, result negative: Secondary | ICD-10-CM | POA: Diagnosis not present

## 2020-06-25 DIAGNOSIS — R109 Unspecified abdominal pain: Secondary | ICD-10-CM | POA: Diagnosis not present

## 2020-06-25 LAB — CBC WITH DIFFERENTIAL/PLATELET
Abs Immature Granulocytes: 0.06 10*3/uL (ref 0.00–0.07)
Basophils Absolute: 0.1 10*3/uL (ref 0.0–0.1)
Basophils Relative: 1 %
Eosinophils Absolute: 0.2 10*3/uL (ref 0.0–0.5)
Eosinophils Relative: 2 %
HCT: 38.1 % (ref 36.0–46.0)
Hemoglobin: 12.5 g/dL (ref 12.0–15.0)
Immature Granulocytes: 1 %
Lymphocytes Relative: 28 %
Lymphs Abs: 2.6 10*3/uL (ref 0.7–4.0)
MCH: 27.3 pg (ref 26.0–34.0)
MCHC: 32.8 g/dL (ref 30.0–36.0)
MCV: 83.2 fL (ref 80.0–100.0)
Monocytes Absolute: 0.8 10*3/uL (ref 0.1–1.0)
Monocytes Relative: 8 %
Neutro Abs: 5.6 10*3/uL (ref 1.7–7.7)
Neutrophils Relative %: 60 %
Platelets: 322 10*3/uL (ref 150–400)
RBC: 4.58 MIL/uL (ref 3.87–5.11)
RDW: 12.4 % (ref 11.5–15.5)
WBC: 9.2 10*3/uL (ref 4.0–10.5)
nRBC: 0 % (ref 0.0–0.2)

## 2020-06-25 LAB — POCT URINALYSIS DIPSTICK, ED / UC
Bilirubin Urine: NEGATIVE
Glucose, UA: NEGATIVE mg/dL
Ketones, ur: NEGATIVE mg/dL
Leukocytes,Ua: NEGATIVE
Nitrite: NEGATIVE
Protein, ur: NEGATIVE mg/dL
Specific Gravity, Urine: >=1.03 (ref 1.005–1.030)
Urobilinogen, UA: 0.2 mg/dL (ref 0.0–1.0)
pH: 5.5 (ref 5.0–8.0)

## 2020-06-25 LAB — COMPREHENSIVE METABOLIC PANEL
ALT: 10 U/L (ref 0–44)
AST: 12 U/L — ABNORMAL LOW (ref 15–41)
Albumin: 4.1 g/dL (ref 3.5–5.0)
Alkaline Phosphatase: 106 U/L (ref 38–126)
Anion gap: 9 (ref 5–15)
BUN: 7 mg/dL (ref 6–20)
CO2: 25 mmol/L (ref 22–32)
Calcium: 8.7 mg/dL — ABNORMAL LOW (ref 8.9–10.3)
Chloride: 103 mmol/L (ref 98–111)
Creatinine, Ser: 0.67 mg/dL (ref 0.44–1.00)
GFR calc Af Amer: >60 mL/min (ref >60)
GFR calc non Af Amer: >60 mL/min (ref >60)
Glucose, Bld: 87 mg/dL (ref 70–99)
Potassium: 3.7 mmol/L (ref 3.5–5.1)
Sodium: 137 mmol/L (ref 135–145)
Total Bilirubin: 0.3 mg/dL (ref 0.3–1.2)
Total Protein: 7 g/dL (ref 6.5–8.1)

## 2020-06-25 LAB — URINALYSIS, ROUTINE W REFLEX MICROSCOPIC
Bilirubin Urine: NEGATIVE
Glucose, UA: NEGATIVE mg/dL
Ketones, ur: NEGATIVE mg/dL
Leukocytes,Ua: NEGATIVE
Nitrite: NEGATIVE
Protein, ur: NEGATIVE mg/dL
Specific Gravity, Urine: 1.025 (ref 1.005–1.030)
pH: 6 (ref 5.0–8.0)

## 2020-06-25 LAB — URINALYSIS, MICROSCOPIC (REFLEX)

## 2020-06-25 LAB — LIPASE, BLOOD: Lipase: 25 U/L (ref 11–51)

## 2020-06-25 LAB — PREGNANCY, URINE: Preg Test, Ur: NEGATIVE

## 2020-06-25 LAB — POC URINE PREG, ED: Preg Test, Ur: NEGATIVE

## 2020-06-25 MED ORDER — ONDANSETRON HCL 4 MG/2ML IJ SOLN
4.0000 mg | Freq: Once | INTRAMUSCULAR | Status: DC
  Filled 2020-06-25: qty 2

## 2020-06-25 MED ORDER — KETOROLAC TROMETHAMINE 30 MG/ML IJ SOLN
15.0000 mg | Freq: Once | INTRAMUSCULAR | Status: AC
  Administered 2020-06-25: 15 mg via INTRAVENOUS
  Filled 2020-06-25: qty 1

## 2020-06-25 MED ORDER — MORPHINE SULFATE (PF) 2 MG/ML IV SOLN: 2.0000 mg | Freq: Once | INTRAVENOUS | Status: DC

## 2020-06-25 NOTE — ED Provider Notes (Signed)
[facility]    CSN: [id number] Arrival date & time: 06/25/20  0909      History   Chief Complaint Chief Complaint  Patient presents with  . Pelvic Pain    HPI Alicia Dunlap is a 28 y.o. female.   Patient is a 29 year old female the presents today with right lower abdominal pain.  This has been present, waxing and waning since yesterday.  Describes as a dull aching pain and rates it at 8 out of 10 at times.  Pain radiates into right flank area.  No associated nausea, vomiting or diarrhea.  No dysuria, hematuria or urinary frequency.  Denies any vaginal discharge, concern for STDs. No fever   ROS per HPI      Past Medical History:  Diagnosis Date  . Bacterial vaginosis   . DENTAL CARIES 08/11/2010   Qualifier: Diagnosis of  By: [place] MD, Sarah    . Drug abuse, marijuana 09/06/2012  . High risk sexual behavior 02/12/2013   Multiple STD's checks: 8 since 2011.   . Tobacco abuse 06/11/2017    Patient Active Problem List   Diagnosis Date Noted  . Lumbar pain 03/23/2020  . Chronic pain of left knee 03/23/2020  . Dyshidrotic eczema 12/18/2019  . Hemorrhoids 10/15/2019  . Post term pregnancy at [redacted] weeks gestation 08/13/2019  . Herpes infection 03/20/2019  . Sickle cell trait (HCC) 01/05/2019    Past Surgical History:  Procedure Laterality Date  . NO PAST SURGERIES      OB History    Gravida  2   Para  2   Term  2   Preterm      AB      Living  2     SAB      TAB      Ectopic      Multiple  0   Live Births  2            Home Medications    Prior to Admission medications   Not on File    Family History No family history on file.  Social History Social History   Tobacco Use  . Smoking status: Former Smoker    Types: Cigarettes  . Smokeless tobacco: Never Used  Vaping Use  . Vaping Use: Former  Substance Use Topics  . Alcohol use: Not Currently  . Drug use: Not Currently     Allergies   Amoxicillin   Review  of Systems Review of Systems   Physical Exam Triage Vital Signs ED Triage Vitals  Enc Vitals Group     BP 06/25/20 0941 114/77     Pulse Rate 06/25/20 0941 82     Resp 06/25/20 0941 16     Temp 06/25/20 0941 98.3 F (36.8 C)     Temp Source 06/25/20 0941 Oral     SpO2 06/25/20 0941 99 %     Weight 06/25/20 0944 234 lb (106.1 kg)     Height 06/25/20 0944 5\' 7"  (1.702 m)     Head Circumference --      Peak Flow --      Pain Score 06/25/20 0944 8     Pain Loc --      Pain Edu? --      Excl. in GC? --    No data found.  Updated Vital Signs BP 114/77   Pulse 82   Temp 98.3 F (36.8 C) (Oral)   Resp 16   Ht 5'  7" (1.702 m)   Wt 234 lb (106.1 kg)   SpO2 99%   BMI 36.65 kg/m   Visual Acuity Right Eye Distance:   Left Eye Distance:   Bilateral Distance:    Right Eye Near:   Left Eye Near:    Bilateral Near:     Physical Exam Vitals and nursing note reviewed.  Constitutional:      General: She is not in acute distress.    Appearance: Normal appearance. She is not ill-appearing, toxic-appearing or diaphoretic.  HENT:     Head: Normocephalic.     Nose: Nose normal.     Mouth/Throat:     Pharynx: Oropharynx is clear.  Eyes:     Conjunctiva/sclera: Conjunctivae normal.  Pulmonary:     Effort: Pulmonary effort is normal.  Abdominal:     Palpations: Abdomen is soft.     Tenderness: There is abdominal tenderness in the right lower quadrant. There is rebound. There is no right CVA tenderness, left CVA tenderness or guarding. Negative signs include Murphy's sign and Rovsing's sign.    Musculoskeletal:        General: Normal range of motion.     Cervical back: Normal range of motion.  Skin:    General: Skin is warm and dry.     Findings: No rash.  Neurological:     Mental Status: She is alert.  Psychiatric:        Mood and Affect: Mood normal.      UC Treatments / Results  Labs (all labs ordered are listed, but only abnormal results are displayed) Labs  Reviewed  POCT URINALYSIS DIPSTICK, ED / UC - Abnormal; Notable for the following components:      Result Value   Hgb urine dipstick TRACE (*)    All other components within normal limits  POC URINE PREG, ED    EKG   Radiology No results found.  Procedures Procedures (including critical care time)  Medications Ordered in UC Medications - No data to display  Initial Impression / Assessment and Plan / UC Course  I have reviewed the triage vital signs and the nursing notes.  Pertinent labs & imaging results that were available during my care of the patient were reviewed by me and considered in my medical decision making (see chart for details).     Right lower quadrant pain Differentials include ovarian cyst versus appendicitis versus kidney stone versus urinary tract infection. Urine with trace blood here today.  No concern for UTI or Pilo at this time.  Will send to the ER for further evaluation to rule out ovarian cyst versus appendicitis.  Final Clinical Impressions(s) / UC Diagnoses   Final diagnoses:  RLQ abdominal pain     Discharge Instructions     Please go to the ER for further evaluation of your right lower abdominal pain     ED Prescriptions    None     PDMP not reviewed this encounter.   Dahlia Byes A, NP 06/25/20 1015

## 2020-06-25 NOTE — ED Triage Notes (Signed)
Pt c/o 8/10 aching pain in right pelvic that radiates to right flank area started yesterday. Pt denies urine issues.

## 2020-06-25 NOTE — Discharge Instructions (Addendum)
Please go to the ER for further evaluation of your right lower abdominal pain

## 2020-06-25 NOTE — ED Provider Notes (Signed)
[facility] EMERGENCY DEPARTMENT Provider Note   CSN: [id number] Arrival date & time: 06/25/20  1051     History Chief Complaint  Patient presents with  . Flank Pain    Alicia Dunlap is a 29 y.o. female who presents for evaluation of right abdominal pain that began last night.  She reports that pain has been in the right mid/lower and flank area.  She states it has been constant has waxed and waned over intensity.  She has not taken any medications for the pain.  She states it is worse when she moves or sits a certain way.  She went to urgent care initially and was sent to the ED for further evaluation.  She denies any associated nausea/vomiting.  She was able to eat today without it worsening her pain.  She denies any fevers, chest pain, difficulty breathing, dysuria, hematuria, vaginal bleeding, vaginal discharge.  The history is provided by the patient.       Past Medical History:  Diagnosis Date  . Bacterial vaginosis   . DENTAL CARIES 08/11/2010   Qualifier: Diagnosis of  By: [place] MD, Sarah    . Drug abuse, marijuana 09/06/2012  . High risk sexual behavior 02/12/2013   Multiple STD's checks: 8 since 2011.   . Tobacco abuse 06/11/2017    Patient Active Problem List   Diagnosis Date Noted  . Lumbar pain 03/23/2020  . Chronic pain of left knee 03/23/2020  . Dyshidrotic eczema 12/18/2019  . Hemorrhoids 10/15/2019  . Post term pregnancy at [redacted] weeks gestation 08/13/2019  . Herpes infection 03/20/2019  . Sickle cell trait (HCC) 01/05/2019    Past Surgical History:  Procedure Laterality Date  . NO PAST SURGERIES       OB History    Gravida  2   Para  2   Term  2   Preterm      AB      Living  2     SAB      TAB      Ectopic      Multiple  0   Live Births  2           No family history on file.  Social History   Tobacco Use  . Smoking status: Former Smoker    Types: Cigarettes  . Smokeless tobacco: Never Used  Vaping Use  .  Vaping Use: Former  Substance Use Topics  . Alcohol use: Not Currently  . Drug use: Not Currently    Home Medications Prior to Admission medications   Not on File    Allergies    Amoxicillin  Review of Systems   Review of Systems  Constitutional: Negative for fever.  Respiratory: Negative for cough and shortness of breath.   Cardiovascular: Negative for chest pain.  Gastrointestinal: Positive for abdominal pain. Negative for nausea and vomiting.  Genitourinary: Negative for dysuria and hematuria.  Neurological: Negative for headaches.  All other systems reviewed and are negative.   Physical Exam Updated Vital Signs BP 133/87 (BP Location: Right Arm)   Pulse 88   Temp 98.6 F (37 C) (Oral)   Resp 15   Ht 5\' 7"  (1.702 m)   Wt 106.1 kg   LMP 06/05/2020   SpO2 97%   Breastfeeding Yes   BMI 36.65 kg/m   Physical Exam Vitals and nursing note reviewed.  Constitutional:      Appearance: Normal appearance. She is well-developed.     Comments:  Sitting comfortably on examination table.   HENT:     Head: Normocephalic and atraumatic.  Eyes:     General: Lids are normal.     Conjunctiva/sclera: Conjunctivae normal.     Pupils: Pupils are equal, round, and reactive to light.  Cardiovascular:     Rate and Rhythm: Normal rate and regular rhythm.     Pulses: Normal pulses.     Heart sounds: Normal heart sounds. No murmur heard.  No friction rub. No gallop.   Pulmonary:     Effort: Pulmonary effort is normal.     Breath sounds: Normal breath sounds.     Comments: Lungs clear to auscultation bilaterally.  Symmetric chest rise.  No wheezing, rales, rhonchi. Abdominal:     Palpations: Abdomen is soft. Abdomen is not rigid.     Tenderness: There is abdominal tenderness in the right lower quadrant. There is right CVA tenderness. There is no left CVA tenderness or guarding.     Comments: Abdomen is soft, nondistended.  Tenderness palpation noted to the mid right abdomen, right  flank and diffusely right lower abdomen.  Mild right-sided CVA tenderness.  No focal tenderness of the McBurney's point.  Musculoskeletal:        General: Normal range of motion.     Cervical back: Full passive range of motion without pain.  Skin:    General: Skin is warm and dry.     Capillary Refill: Capillary refill takes less than 2 seconds.  Neurological:     Mental Status: She is alert and oriented to person, place, and time.  Psychiatric:        Speech: Speech normal.     ED Results / Procedures / Treatments   Labs (all labs ordered are listed, but only abnormal results are displayed) Labs Reviewed  COMPREHENSIVE METABOLIC PANEL - Abnormal; Notable for the following components:      Result Value   Calcium 8.7 (*)    AST 12 (*)    All other components within normal limits  URINALYSIS, ROUTINE W REFLEX MICROSCOPIC - Abnormal; Notable for the following components:   APPearance HAZY (*)    Hgb urine dipstick TRACE (*)    All other components within normal limits  URINALYSIS, MICROSCOPIC (REFLEX) - Abnormal; Notable for the following components:   Bacteria, UA FEW (*)    All other components within normal limits  CBC WITH DIFFERENTIAL/PLATELET  LIPASE, BLOOD  PREGNANCY, URINE    EKG None  Radiology CT Renal Stone Study  Result Date: 06/25/2020 CLINICAL DATA:  29 year old female with flank pain. Concern for kidney stone. EXAM: CT ABDOMEN AND PELVIS WITHOUT CONTRAST TECHNIQUE: Multidetector CT imaging of the abdomen and pelvis was performed following the standard protocol without IV contrast. COMPARISON:  None. FINDINGS: Evaluation of this exam is limited in the absence of intravenous contrast. Lower chest: The visualized lung bases are clear. No intra-abdominal free air or free fluid. Hepatobiliary: No focal liver abnormality is seen. No gallstones, gallbladder wall thickening, or biliary dilatation. Pancreas: Unremarkable. No pancreatic ductal dilatation or surrounding  inflammatory changes. Spleen: Normal in size without focal abnormality. Adrenals/Urinary Tract: The adrenal glands unremarkable. The kidneys, visualized ureters, and urinary bladder appear unremarkable. Stomach/Bowel: There is no bowel obstruction or active inflammation. The appendix is normal. Vascular/Lymphatic: The abdominal aorta and IVC unremarkable. No portal venous gas. There is no adenopathy. Reproductive: The uterus is anteverted and grossly unremarkable. No adnexal masses. Other: None Musculoskeletal: No acute or significant osseous findings. IMPRESSION:  No acute intra-abdominal or pelvic pathology. No hydronephrosis or nephrolithiasis. Electronically Signed   By: Elgie Collard M.D.   On: 06/25/2020 16:41    Procedures Procedures (including critical care time)  Medications Ordered in ED Medications  ondansetron (ZOFRAN) injection 4 mg (4 mg Intravenous Refused 06/25/20 1610)  ketorolac (TORADOL) 30 MG/ML injection 15 mg (15 mg Intravenous Given 06/25/20 1611)    ED Course  I have reviewed the triage vital signs and the nursing notes.  Pertinent labs & imaging results that were available during my care of the patient were reviewed by me and considered in my medical decision making (see chart for details).    MDM Rules/Calculators/A&P                          29 year old female who presents for evaluation of right abdominal pain that began yesterday.  Not associated with any fevers, nausea/vomiting.  Went to urgent care initially and was sent to the ED for further evaluation.  On initially arrival, she is afebrile, nontoxic-appearing.  Vital signs are stable.  On exam, she is mildly tender in the right mid abdomen, diffusely in the right lower as well as right-sided CVA.  Concern for infectious process such as viral GI process.  Low suspicion for appendicitis but given her location of symptoms it is a consideration.  Also consider kidney stone. History/physical exam is not concerning  for ovarian torsion, Guyon etiology.  CBC shows no leukocytosis or anemia.  Lipase is normal.  CMP shows normal BUN and creatinine.  UA shows trace hemoglobin. Pregnancy is negative.  Given trace hemoglobin as well as right-sided CVA tenderness, will plan for renal study for evaluation of possible kidney stone.  CT study shows no evidence of kidney stone.  No hydronephrosis seen.  Appendix is visualized and normal.  Uterus and ovaries are unremarkable.  Discussed results with patient.  She is sitting comfortably on the bed with no signs of distress.  She reports improvement in pain after small amount of analgesics here in the ED.  Repeat abdominal exam is improved.  I discussed with patient that this could be musculoskeletal in etiology.  She does report that she it is worse when she twists or moves.  Could be that she has some oblique musculoskeletal pain.  Encouraged at home supportive care measures. At this time, patient exhibits no emergent life-threatening condition that require further evaluation in ED or admission. Patient had ample opportunity for questions and discussion. All patient's questions were answered with full understanding. Strict return precautions discussed. Patient expresses understanding and agreement to plan.   Portions of this note were generated with Scientist, clinical (histocompatibility and immunogenetics). Dictation errors may occur despite best attempts at proofreading.  Final Clinical Impression(s) / ED Diagnoses Final diagnoses:  Right lower quadrant abdominal pain    Rx / DC Orders ED Discharge Orders    None       Rosana Hoes 06/25/20 1735    Linwood Dibbles, MD 06/26/20 [phone]

## 2020-06-25 NOTE — ED Triage Notes (Signed)
Pt arrives with pain to right flank pain starting yesterday was seen at [facility] and sent here for imaging.

## 2020-06-25 NOTE — Discharge Instructions (Signed)
As we discussed, your work-up here in the ED was reassuring.  As we discussed, this could be musculoskeletal in nature.  You can take Tylenol or Ibuprofen as directed for pain. You can alternate Tylenol and Ibuprofen every 4 hours. If you take Tylenol at 1pm, then you can take Ibuprofen at 5pm. Then you can take Tylenol again at 9pm.   Closely monitor your symptoms.  Return the emergency department for any worsening abdominal pain, vomiting, fevers or any other worsening or concerning symptoms.

## 2020-07-14 DIAGNOSIS — Z419 Encounter for procedure for purposes other than remedying health state, unspecified: Secondary | ICD-10-CM | POA: Diagnosis not present

## 2020-08-06 ENCOUNTER — Encounter (HOSPITAL_COMMUNITY): Payer: Self-pay | Admitting: *Deleted

## 2020-08-06 ENCOUNTER — Emergency Department (HOSPITAL_COMMUNITY)
Admission: EM | Admit: 2020-08-06 | Discharge: 2020-08-07 | Disposition: A | Discharge status: Home / Self Care | Payer: Medicaid Other | Attending: Emergency Medicine | Admitting: Emergency Medicine

## 2020-08-06 ENCOUNTER — Other Ambulatory Visit: Payer: Self-pay

## 2020-08-06 DIAGNOSIS — M542 Cervicalgia: Secondary | ICD-10-CM | POA: Insufficient documentation

## 2020-08-06 DIAGNOSIS — M25512 Pain in left shoulder: Secondary | ICD-10-CM | POA: Insufficient documentation

## 2020-08-06 DIAGNOSIS — Z5321 Procedure and treatment not carried out due to patient leaving prior to being seen by health care provider: Secondary | ICD-10-CM | POA: Diagnosis not present

## 2020-08-06 DIAGNOSIS — R0602 Shortness of breath: Secondary | ICD-10-CM | POA: Diagnosis not present

## 2020-08-06 NOTE — ED Triage Notes (Signed)
To ED for eval after feeling a sharp shooting pain down left arm today. First noticed itching where she had covid vaccine. States she had her first covid vaccine 2 wks ago in her left arm and at that time had same feeling but only lasted a few seconds. States today she had to bring her son to the ED and he was dx with URI. No pain now but feels tingling in her arm. Pt very talkative and states she may be stressed out. States she did have some left shoulder/neck pain but that has resolved. Radial pulses palpable. Pt states she has had ECGs in the past due to this same left arm tingling - never with a dx.

## 2020-08-07 NOTE — ED Notes (Signed)
Pt did not respond when called for vitals check X3, not seen outside lobby 

## 2020-08-13 DIAGNOSIS — Z419 Encounter for procedure for purposes other than remedying health state, unspecified: Secondary | ICD-10-CM | POA: Diagnosis not present

## 2020-09-13 DIAGNOSIS — Z419 Encounter for procedure for purposes other than remedying health state, unspecified: Secondary | ICD-10-CM | POA: Diagnosis not present

## 2020-10-13 DIAGNOSIS — Z419 Encounter for procedure for purposes other than remedying health state, unspecified: Secondary | ICD-10-CM | POA: Diagnosis not present

## 2020-11-13 DIAGNOSIS — Z419 Encounter for procedure for purposes other than remedying health state, unspecified: Secondary | ICD-10-CM | POA: Diagnosis not present

## 2020-12-14 DIAGNOSIS — Z419 Encounter for procedure for purposes other than remedying health state, unspecified: Secondary | ICD-10-CM | POA: Diagnosis not present

## 2021-01-11 DIAGNOSIS — Z419 Encounter for procedure for purposes other than remedying health state, unspecified: Secondary | ICD-10-CM | POA: Diagnosis not present

## 2021-01-29 ENCOUNTER — Emergency Department (HOSPITAL_COMMUNITY)
Admission: EM | Admit: 2021-01-29 | Discharge: 2021-01-29 | Disposition: A | Discharge status: Home / Self Care | Payer: Medicaid Other | Attending: Emergency Medicine | Admitting: Emergency Medicine

## 2021-01-29 ENCOUNTER — Emergency Department (HOSPITAL_COMMUNITY): Payer: Medicaid Other

## 2021-01-29 ENCOUNTER — Encounter (HOSPITAL_COMMUNITY): Payer: Self-pay | Admitting: Emergency Medicine

## 2021-01-29 ENCOUNTER — Other Ambulatory Visit: Payer: Self-pay

## 2021-01-29 DIAGNOSIS — R079 Chest pain, unspecified: Secondary | ICD-10-CM | POA: Insufficient documentation

## 2021-01-29 DIAGNOSIS — R0602 Shortness of breath: Secondary | ICD-10-CM | POA: Diagnosis not present

## 2021-01-29 DIAGNOSIS — Z5321 Procedure and treatment not carried out due to patient leaving prior to being seen by health care provider: Secondary | ICD-10-CM | POA: Diagnosis not present

## 2021-01-29 NOTE — ED Notes (Signed)
Pt called x3 for VS update. No response. Pt was seen leaving ED lobby.

## 2021-01-29 NOTE — ED Triage Notes (Signed)
Patient reports intermittent left chest pain for several weeks with SOB and mild nausea , no fever or cough.

## 2021-02-11 DIAGNOSIS — Z419 Encounter for procedure for purposes other than remedying health state, unspecified: Secondary | ICD-10-CM | POA: Diagnosis not present

## 2021-03-13 DIAGNOSIS — Z419 Encounter for procedure for purposes other than remedying health state, unspecified: Secondary | ICD-10-CM | POA: Diagnosis not present

## 2021-04-13 DIAGNOSIS — Z419 Encounter for procedure for purposes other than remedying health state, unspecified: Secondary | ICD-10-CM | POA: Diagnosis not present

## 2021-05-13 DIAGNOSIS — Z419 Encounter for procedure for purposes other than remedying health state, unspecified: Secondary | ICD-10-CM | POA: Diagnosis not present

## 2021-06-13 DIAGNOSIS — Z419 Encounter for procedure for purposes other than remedying health state, unspecified: Secondary | ICD-10-CM | POA: Diagnosis not present

## 2021-07-14 DIAGNOSIS — Z419 Encounter for procedure for purposes other than remedying health state, unspecified: Secondary | ICD-10-CM | POA: Diagnosis not present

## 2021-08-13 DIAGNOSIS — Z419 Encounter for procedure for purposes other than remedying health state, unspecified: Secondary | ICD-10-CM | POA: Diagnosis not present

## 2021-08-15 ENCOUNTER — Other Ambulatory Visit: Payer: Self-pay

## 2021-08-15 ENCOUNTER — Ambulatory Visit (INDEPENDENT_AMBULATORY_CARE_PROVIDER_SITE_OTHER): Payer: Medicaid Other | Admitting: Family Medicine

## 2021-08-15 VITALS — BP 113/67 | HR 108 | Wt 250.0 lb

## 2021-08-15 DIAGNOSIS — Z3200 Encounter for pregnancy test, result unknown: Secondary | ICD-10-CM

## 2021-08-15 DIAGNOSIS — Z3201 Encounter for pregnancy test, result positive: Secondary | ICD-10-CM | POA: Diagnosis not present

## 2021-08-15 LAB — POCT URINE PREGNANCY: Preg Test, Ur: POSITIVE — AB

## 2021-08-15 NOTE — Progress Notes (Signed)
    SUBJECTIVE:   CHIEF COMPLAINT / HPI:   Ms. Toenjes is a 30 yo F who presents for the following issue.   Possible pregnancy Has had 2 positive home pregnancy test.  Endorsing nausea, intermittent bilateral lower abdominal cramping, not requiring pain medicine.  Denies vomiting and vaginal bleeding.  Last menstrual period 8/19.  PERTINENT  PMH / PSH: SCT, Hx of herpes  OBJECTIVE:   BP 113/67   Pulse (!) 108   Wt 250 lb (113.4 kg)   SpO2 99%   BMI 39.16 kg/m   Results for orders placed or performed in visit on 08/15/21 (from the past 24 hour(s))  POCT urine pregnancy     Status: Abnormal   Collection Time: 08/15/21  3:10 PM  Result Value Ref Range   Preg Test, Ur Positive (A) Negative    General: Appears well, no acute distress. Age appropriate. Respiratory: normal effort Neuro: alert and oriented x4 Psych: normal affect   ASSESSMENT/PLAN:   Positive pregnancy test By LMP she is [redacted]w[redacted]d. Experiencing nausea without emesis, does not desire medication at this time. Endorsing bilateral lower abdominal cramping. Will obtain ultrasound for dating and to confirm IUP. Suspicion for ectopic low given bilateral nature of lower abdominal cramping. Denies vaginal bleeding. Prenatal labs today. Follow up in 4 weeks for first prenatal visit.  - Culture, OB Urine - Obstetric Panel, Including HIV([facility]) - Hgb Fractionation Cascade - HCV Ab w Reflex to Quant PCR - US OB LESS THAN 14 WEEKS W/ OB TRANSVAGINAL AND DOPPLER; Future   Lavonda Jumbo, DO [facility]

## 2021-08-15 NOTE — Patient Instructions (Signed)
You are pregnant congratulations!  Today we will complete your OB labs.   We have scheduled you for an ultrasound as below.   Schedule your initial OB appointment in 4 weeks.   Dr. Salvadore Dom

## 2021-08-17 LAB — HGB SOLUBILITY: Hgb Solubility: POSITIVE — AB

## 2021-08-17 LAB — HGB FRACTIONATION CASCADE
Hgb A2: 3.2 % (ref 1.8–3.2)
Hgb A: 57.2 % — ABNORMAL LOW (ref 96.4–98.8)
Hgb F: 0 % (ref 0.0–2.0)
Hgb S: 39.6 % — ABNORMAL HIGH

## 2021-08-17 LAB — OBSTETRIC PANEL, INCLUDING HIV
Antibody Screen: NEGATIVE
Basophils Absolute: 0 10*3/uL (ref 0.0–0.2)
Basos: 0 %
EOS (ABSOLUTE): 0.2 10*3/uL (ref 0.0–0.4)
Eos: 2 %
HIV Screen 4th Generation wRfx: NONREACTIVE
Hematocrit: 35.8 % (ref 34.0–46.6)
Hemoglobin: 11.8 g/dL (ref 11.1–15.9)
Hepatitis B Surface Ag: NEGATIVE
Immature Grans (Abs): 0 10*3/uL (ref 0.0–0.1)
Immature Granulocytes: 0 %
Lymphocytes Absolute: 2.7 10*3/uL (ref 0.7–3.1)
Lymphs: 24 %
MCH: 26.9 pg (ref 26.6–33.0)
MCHC: 33 g/dL (ref 31.5–35.7)
MCV: 82 fL (ref 79–97)
Monocytes Absolute: 0.6 10*3/uL (ref 0.1–0.9)
Monocytes: 6 %
Neutrophils Absolute: 7.7 10*3/uL — ABNORMAL HIGH (ref 1.4–7.0)
Neutrophils: 68 %
Platelets: 339 10*3/uL (ref 150–450)
RBC: 4.39 x10E6/uL (ref 3.77–5.28)
RDW: 11.9 % (ref 11.7–15.4)
RPR Ser Ql: NONREACTIVE
Rh Factor: POSITIVE
Rubella Antibodies, IGG: 1 index (ref >0.99)
WBC: 11.3 10*3/uL — ABNORMAL HIGH (ref 3.4–10.8)

## 2021-08-17 LAB — HCV INTERPRETATION

## 2021-08-17 LAB — CULTURE, OB URINE

## 2021-08-17 LAB — URINE CULTURE, OB REFLEX

## 2021-08-17 LAB — HCV AB W REFLEX TO QUANT PCR: HCV Ab: <0.1 s/co ratio (ref 0.0–0.9)

## 2021-08-18 ENCOUNTER — Ambulatory Visit
Admission: RE | Admit: 2021-08-18 | Discharge: 2021-08-18 | Disposition: A | Discharge status: Home / Self Care | Payer: Medicaid Other | Source: Ambulatory Visit | Attending: Family Medicine | Admitting: Family Medicine

## 2021-08-18 ENCOUNTER — Other Ambulatory Visit: Payer: Self-pay

## 2021-08-18 DIAGNOSIS — Z3201 Encounter for pregnancy test, result positive: Secondary | ICD-10-CM | POA: Insufficient documentation

## 2021-08-18 DIAGNOSIS — Z3A01 Less than 8 weeks gestation of pregnancy: Secondary | ICD-10-CM | POA: Diagnosis not present

## 2021-08-18 DIAGNOSIS — O26891 Other specified pregnancy related conditions, first trimester: Secondary | ICD-10-CM | POA: Diagnosis not present

## 2021-08-18 DIAGNOSIS — R1030 Lower abdominal pain, unspecified: Secondary | ICD-10-CM | POA: Diagnosis not present

## 2021-09-08 ENCOUNTER — Other Ambulatory Visit: Payer: Self-pay

## 2021-09-08 ENCOUNTER — Inpatient Hospital Stay (HOSPITAL_COMMUNITY)
Admission: AD | Admit: 2021-09-08 | Discharge: 2021-09-08 | Disposition: A | Discharge status: Home / Self Care | Payer: Medicaid Other | Attending: Obstetrics and Gynecology | Admitting: Obstetrics and Gynecology

## 2021-09-08 ENCOUNTER — Encounter (HOSPITAL_COMMUNITY): Payer: Self-pay | Admitting: Obstetrics and Gynecology

## 2021-09-08 DIAGNOSIS — R1031 Right lower quadrant pain: Secondary | ICD-10-CM | POA: Insufficient documentation

## 2021-09-08 DIAGNOSIS — O26891 Other specified pregnancy related conditions, first trimester: Secondary | ICD-10-CM | POA: Diagnosis not present

## 2021-09-08 DIAGNOSIS — Z880 Allergy status to penicillin: Secondary | ICD-10-CM | POA: Insufficient documentation

## 2021-09-08 DIAGNOSIS — R059 Cough, unspecified: Secondary | ICD-10-CM | POA: Diagnosis not present

## 2021-09-08 DIAGNOSIS — K5900 Constipation, unspecified: Secondary | ICD-10-CM | POA: Diagnosis not present

## 2021-09-08 DIAGNOSIS — Z87891 Personal history of nicotine dependence: Secondary | ICD-10-CM | POA: Diagnosis not present

## 2021-09-08 DIAGNOSIS — R0981 Nasal congestion: Secondary | ICD-10-CM | POA: Diagnosis not present

## 2021-09-08 DIAGNOSIS — O219 Vomiting of pregnancy, unspecified: Secondary | ICD-10-CM | POA: Insufficient documentation

## 2021-09-08 DIAGNOSIS — R519 Headache, unspecified: Secondary | ICD-10-CM | POA: Diagnosis not present

## 2021-09-08 DIAGNOSIS — R112 Nausea with vomiting, unspecified: Secondary | ICD-10-CM

## 2021-09-08 DIAGNOSIS — U071 COVID-19: Secondary | ICD-10-CM | POA: Insufficient documentation

## 2021-09-08 DIAGNOSIS — O98511 Other viral diseases complicating pregnancy, first trimester: Secondary | ICD-10-CM | POA: Insufficient documentation

## 2021-09-08 DIAGNOSIS — Z79899 Other long term (current) drug therapy: Secondary | ICD-10-CM | POA: Insufficient documentation

## 2021-09-08 DIAGNOSIS — R109 Unspecified abdominal pain: Secondary | ICD-10-CM | POA: Diagnosis not present

## 2021-09-08 DIAGNOSIS — Z3A09 9 weeks gestation of pregnancy: Secondary | ICD-10-CM | POA: Diagnosis not present

## 2021-09-08 DIAGNOSIS — O99611 Diseases of the digestive system complicating pregnancy, first trimester: Secondary | ICD-10-CM | POA: Insufficient documentation

## 2021-09-08 LAB — URINALYSIS, ROUTINE W REFLEX MICROSCOPIC
Bilirubin Urine: NEGATIVE
Glucose, UA: NEGATIVE mg/dL
Hgb urine dipstick: NEGATIVE
Ketones, ur: 80 mg/dL — AB
Leukocytes,Ua: NEGATIVE
Nitrite: NEGATIVE
Protein, ur: NEGATIVE mg/dL
Specific Gravity, Urine: 1.018 (ref 1.005–1.030)
pH: 5 (ref 5.0–8.0)

## 2021-09-08 LAB — BASIC METABOLIC PANEL
Anion gap: 8 (ref 5–15)
BUN: <5 mg/dL — ABNORMAL LOW (ref 6–20)
CO2: 22 mmol/L (ref 22–32)
Calcium: 9 mg/dL (ref 8.9–10.3)
Chloride: 103 mmol/L (ref 98–111)
Creatinine, Ser: 0.57 mg/dL (ref 0.44–1.00)
GFR, Estimated: >60 mL/min (ref >60)
Glucose, Bld: 100 mg/dL — ABNORMAL HIGH (ref 70–99)
Potassium: 3.6 mmol/L (ref 3.5–5.1)
Sodium: 133 mmol/L — ABNORMAL LOW (ref 135–145)

## 2021-09-08 LAB — CBC
HCT: 36.2 % (ref 36.0–46.0)
Hemoglobin: 12.3 g/dL (ref 12.0–15.0)
MCH: 27.5 pg (ref 26.0–34.0)
MCHC: 34 g/dL (ref 30.0–36.0)
MCV: 81 fL (ref 80.0–100.0)
Platelets: 313 10*3/uL (ref 150–400)
RBC: 4.47 MIL/uL (ref 3.87–5.11)
RDW: 11.9 % (ref 11.5–15.5)
WBC: 7.9 10*3/uL (ref 4.0–10.5)
nRBC: 0 % (ref 0.0–0.2)

## 2021-09-08 LAB — RESP PANEL BY RT-PCR (FLU A&B, COVID) ARPGX2
Influenza A by PCR: NEGATIVE
Influenza B by PCR: NEGATIVE
SARS Coronavirus 2 by RT PCR: POSITIVE — AB

## 2021-09-08 LAB — WET PREP, GENITAL
Clue Cells Wet Prep HPF POC: NONE SEEN
Sperm: NONE SEEN
Trich, Wet Prep: NONE SEEN
Yeast Wet Prep HPF POC: NONE SEEN

## 2021-09-08 MED ORDER — PROMETHAZINE HCL 25 MG PO TABS: 25.0000 mg | ORAL_TABLET | Freq: Four times a day (QID) | ORAL | 2 refills | Status: DC | PRN

## 2021-09-08 MED ORDER — ONDANSETRON 4 MG PO TBDP: 4.0000 mg | ORAL_TABLET | Freq: Four times a day (QID) | ORAL | 1 refills | Status: DC | PRN

## 2021-09-08 MED ORDER — LACTATED RINGERS IV SOLN: Freq: Once | INTRAVENOUS | Status: AC

## 2021-09-08 MED ORDER — DIPHENHYDRAMINE HCL 50 MG/ML IJ SOLN
12.5000 mg | Freq: Once | INTRAMUSCULAR | Status: AC
  Administered 2021-09-08: 12.5 mg via INTRAVENOUS
  Filled 2021-09-08: qty 1

## 2021-09-08 MED ORDER — ACETAMINOPHEN 500 MG PO TABS
1000.0000 mg | ORAL_TABLET | Freq: Once | ORAL | Status: AC
  Administered 2021-09-08: 1000 mg via ORAL
  Filled 2021-09-08: qty 2

## 2021-09-08 MED ORDER — FAMOTIDINE IN NACL 20-0.9 MG/50ML-% IV SOLN
20.0000 mg | Freq: Once | INTRAVENOUS | Status: AC
  Administered 2021-09-08: 20 mg via INTRAVENOUS
  Filled 2021-09-08: qty 50

## 2021-09-08 MED ORDER — LACTATED RINGERS IV BOLUS
1000.0000 mL | Freq: Once | INTRAVENOUS | Status: AC
  Administered 2021-09-08: 1000 mL via INTRAVENOUS

## 2021-09-08 MED ORDER — METOCLOPRAMIDE HCL 5 MG/ML IJ SOLN
5.0000 mg | Freq: Once | INTRAMUSCULAR | Status: AC
  Administered 2021-09-08: 5 mg via INTRAVENOUS
  Filled 2021-09-08: qty 2

## 2021-09-08 MED ORDER — ONDANSETRON HCL 4 MG/2ML IJ SOLN: 4.0000 mg | Freq: Once | INTRAMUSCULAR | Status: DC

## 2021-09-08 MED ORDER — ONDANSETRON 4 MG PO TBDP
4.0000 mg | ORAL_TABLET | Freq: Once | ORAL | Status: AC
  Administered 2021-09-08: 4 mg via ORAL
  Filled 2021-09-08: qty 1

## 2021-09-08 NOTE — Progress Notes (Signed)
Wynelle Bourgeois CNM in to discuss test results and d/c plan. Written and verbal d/c instructions given and understanding voiced. Bedside u/s done by CNM before pt d/c home

## 2021-09-08 NOTE — MAU Note (Signed)
RN in to start IV and pt in BR vomiting.

## 2021-09-08 NOTE — IPOC Note (Signed)
CRITICAL VALUE STICKER  CRITICAL VALUE: Covid +  RECEIVER (on-site recipient of call): [place] RN  DATE & TIME NOTIFIED: 09/08/21 0617  MESSENGER (representative from lab): Tresa Endo   MD NOTIFIED: Cynda Acres  TIME OF NOTIFICATION:0625  RESPONSE:  N/A

## 2021-09-08 NOTE — MAU Note (Addendum)
Pain in RLQ off and on all day. Sometimes goes to Lside. Took Tylenol at 1930 and went to sleep. Pain woke me up at 0300. Just don't feel well. Back of neck hurts, headache, dry cough. Denies VB or LOF. Some nausea but no vomiting or diarrhea. Headache hurts in back of neck and up to head. Tylenol helps h/a but then it comes back

## 2021-09-08 NOTE — Progress Notes (Signed)
Alicia Dunlap CNM obtained vaginal swabs by blind swab

## 2021-09-08 NOTE — MAU Provider Note (Signed)
Chief Complaint: Abdominal Pain, Headache, and Cough   Event Date/Time   First Provider Initiated Contact with Patient 09/08/21 0357        SUBJECTIVE HPI: Alicia Dunlap is a 30 y.o. G3P2002 at [redacted]w[redacted]d by LMP who presents to maternity admissions reporting pain in RLQ all day, worse tonight.  Yesterday it was in LUQ.  Some constipation.  Has had nausea throughout pregnancy  Has had headache, nasal congestion and dry cough for 2 days.  . She denies vaginal bleeding, vaginal itching/burning, urinary symptoms, or fever/chills.    Abdominal Pain This is a new problem. The current episode started yesterday. The problem occurs constantly. The pain is located in the RLQ (was LUQ yesterday). The quality of the pain is cramping and sharp. Associated symptoms include constipation, headaches and nausea. Pertinent negatives include no diarrhea, dysuria, fever, myalgias or vomiting. The pain is relieved by Nothing. She has tried nothing for the symptoms.  Headache  This is a new problem. The current episode started yesterday. The problem occurs constantly. The quality of the pain is described as aching. Associated symptoms include abdominal pain, coughing and nausea. Pertinent negatives include no fever or vomiting. Nothing aggravates the symptoms. She has tried nothing for the symptoms.  Cough This is a new problem. The current episode started yesterday. The cough is Non-productive. Associated symptoms include headaches and nasal congestion. Pertinent negatives include no fever or myalgias. Nothing aggravates the symptoms. She has tried nothing for the symptoms.  RN Note: Pain in RLQ off and on all day. Sometimes goes to Lside. Took Tylenol at 1930 and went to sleep. Pain woke me up at 0300. Just don't feel well. Back of neck hurts, headache, dry cough. Denies VB or LOF. Some nausea but no vomiting or diarrhea. Headache hurts in back of neck and up to head. Tylenol helps h/a but then it comes back  Past Medical  History:  Diagnosis Date   Bacterial vaginosis    DENTAL CARIES 08/11/2010   Qualifier: Diagnosis of  By: [place] MD, Sarah     Drug abuse, marijuana 09/06/2012   High risk sexual behavior 02/12/2013   Multiple STD's checks: 8 since 2011.    Tobacco abuse 06/11/2017   Past Surgical History:  Procedure Laterality Date   NO PAST SURGERIES     Social History   Socioeconomic History   Marital status: Single    Spouse name: Not on file   Number of children: Not on file   Years of education: Not on file   Highest education level: Not on file  Occupational History   Not on file  Tobacco Use   Smoking status: Former    Types: Cigarettes   Smokeless tobacco: Never  Vaping Use   Vaping Use: Former  Substance and Sexual Activity   Alcohol use: Not Currently   Drug use: Not Currently   Sexual activity: Yes    Birth control/protection: None  Other Topics Concern   Not on file  Social History Narrative   ** Merged History Encounter **       Social Determinants of Health   Financial Resource Strain: Not on file  Food Insecurity: Not on file  Transportation Needs: Not on file  Physical Activity: Not on file  Stress: Not on file  Social Connections: Not on file  Intimate Partner Violence: Not on file   No current facility-administered medications on file prior to encounter.   Current Outpatient Medications on File Prior to Encounter  Medication Sig Dispense Refill   acetaminophen (TYLENOL) 500 MG tablet Take 1,000 mg by mouth every 6 (six) hours as needed.     diphenhydrAMINE (BENADRYL ALLERGY) 25 mg capsule Take 25 mg by mouth every 6 (six) hours as needed.     Prenatal Vit-Fe Fumarate-FA (PRENATAL MULTIVITAMIN) TABS tablet Take by mouth daily at 12 noon.     Allergies  Allergen Reactions   Amoxicillin Rash    Has patient had a PCN reaction causing immediate rash, facial/tongue/throat swelling, SOB or lightheadedness with hypotension: no Has patient had a PCN reaction  causing severe rash involving mucus membranes or skin necrosis: no Has patient had a PCN reaction that required hospitalization no Has patient had a PCN reaction occurring within the last 10 years: yes If all of the above answers are "NO", then may proceed with Cephalosporin use.     I have reviewed patient's Past Medical Hx, Surgical Hx, Family Hx, Social Hx, medications and allergies.   ROS:  Review of Systems  Constitutional:  Negative for fever.  Respiratory:  Positive for cough.   Gastrointestinal:  Positive for abdominal pain, constipation and nausea. Negative for diarrhea and vomiting.  Genitourinary:  Negative for dysuria.  Musculoskeletal:  Negative for myalgias.  Neurological:  Positive for headaches.  Review of Systems  Other systems negative   Physical Exam  Physical Exam Patient Vitals for the past 24 hrs:  BP Temp Pulse Resp SpO2 Height Weight  09/08/21 0340 119/75 -- (!) 103 -- 99 % -- --  09/08/21 0339 -- 98.1 F (36.7 C) -- 18 -- 5\' 7"  (1.702 m) 106.1 kg   Constitutional: Well-developed, well-nourished female in no acute distress.  Cardiovascular: normal rate Respiratory: normal effort GI: Abd soft, non-tender. No rebound or guarding MS: Extremities nontender, no edema, normal ROM Neurologic: Alert and oriented x 4.  GU: Neg CVAT.  PELVIC EXAM: Cervix 0/long/high, firm, anterior, neg CMT, uterus nontender, nonenlarged, adnexa without tenderness, enlargement, or mass   LAB RESULTS Results for orders placed or performed during the hospital encounter of 09/08/21 (from the past 24 hour(s))  Urinalysis, Routine w reflex microscopic Urine, Clean Catch     Status: Abnormal   Collection Time: 09/08/21  3:59 AM  Result Value Ref Range   Color, Urine YELLOW YELLOW   APPearance HAZY (A) CLEAR   Specific Gravity, Urine 1.018 1.005 - 1.030   pH 5.0 5.0 - 8.0   Glucose, UA NEGATIVE NEGATIVE mg/dL   Hgb urine dipstick NEGATIVE NEGATIVE   Bilirubin Urine NEGATIVE  NEGATIVE   Ketones, ur 80 (A) NEGATIVE mg/dL   Protein, ur NEGATIVE NEGATIVE mg/dL   Nitrite NEGATIVE NEGATIVE   Leukocytes,Ua NEGATIVE NEGATIVE  Resp Panel by RT-PCR (Flu A&B, Covid) Nasopharyngeal Swab     Status: Abnormal   Collection Time: 09/08/21  4:37 AM   Specimen: Nasopharyngeal Swab; Nasopharyngeal(NP) swabs in vial transport medium  Result Value Ref Range   SARS Coronavirus 2 by RT PCR POSITIVE (A) NEGATIVE   Influenza A by PCR NEGATIVE NEGATIVE   Influenza B by PCR NEGATIVE NEGATIVE  CBC     Status: None   Collection Time: 09/08/21  4:37 AM  Result Value Ref Range   WBC 7.9 4.0 - 10.5 K/uL   RBC 4.47 3.87 - 5.11 MIL/uL   Hemoglobin 12.3 12.0 - 15.0 g/dL   HCT 09/10/21 70.3 - 50.0 %   MCV 81.0 80.0 - 100.0 fL   MCH 27.5 26.0 - 34.0 pg  MCHC 34.0 30.0 - 36.0 g/dL   RDW 40.3 47.4 - 25.9 %   Platelets 313 150 - 400 K/uL   nRBC 0.0 0.0 - 0.2 %  Basic metabolic panel     Status: Abnormal   Collection Time: 09/08/21  4:37 AM  Result Value Ref Range   Sodium 133 (L) 135 - 145 mmol/L   Potassium 3.6 3.5 - 5.1 mmol/L   Chloride 103 98 - 111 mmol/L   CO2 22 22 - 32 mmol/L   Glucose, Bld 100 (H) 70 - 99 mg/dL   BUN <5 (L) 6 - 20 mg/dL   Creatinine, Ser 5.63 0.44 - 1.00 mg/dL   Calcium 9.0 8.9 - 87.5 mg/dL   GFR, Estimated >64 >33 mL/min   Anion gap 8 5 - 15  Wet prep, genital     Status: Abnormal   Collection Time: 09/08/21  6:19 AM   Specimen: PATH Cytology Cervicovaginal Ancillary Only  Result Value Ref Range   Yeast Wet Prep HPF POC NONE SEEN NONE SEEN   Trich, Wet Prep NONE SEEN NONE SEEN   Clue Cells Wet Prep HPF POC NONE SEEN NONE SEEN   WBC, Wet Prep HPF POC MANY (A) NONE SEEN   Sperm NONE SEEN      A/Positive/-- (10/03 1544)  IMAGING Previous US identified Single IUP   MAU Management/MDM: Ordered Lab to evaluate for leukocytosis or other imbalances Ordered IV fluids with Reglan/Benadryl for headache and nausea Pepcid to help with nausea also. Did vomit  once so we gave a dose of Zofran  Headache improved as did the nausea Discussed diagnosis of Covid.  Since symptoms not severe, will not order antiviral meds for now.   Will prescribe Phenergan and zofran for nausea prn   ASSESSMENT SIngle IUP at [redacted]w[redacted]d Covid  Right lateral abdominal pain Nasal congestion Nausea and vomiting Headache  PLAN Discharge home Rx Zofran for nausea (risks reviewed, use sparingly) Rx Promethazine for nausea (use first) Conservative care Warning signs reviewed  Pt stable at time of discharge. Encouraged to return here if she develops worsening of symptoms, increase in pain, fever, or other concerning symptoms.    Wynelle Bourgeois CNM, MSN Certified Nurse-Midwife 09/08/2021  3:57 AM

## 2021-09-09 LAB — GC/CHLAMYDIA PROBE AMP (~~LOC~~) NOT AT ARMC
Chlamydia: NEGATIVE
Comment: NEGATIVE
Comment: NORMAL
Neisseria Gonorrhea: NEGATIVE

## 2021-09-12 ENCOUNTER — Encounter: Payer: Medicaid Other | Admitting: Family Medicine

## 2021-09-13 DIAGNOSIS — Z419 Encounter for procedure for purposes other than remedying health state, unspecified: Secondary | ICD-10-CM | POA: Diagnosis not present

## 2021-09-23 NOTE — Progress Notes (Signed)
Patient Name: Alicia Dunlap Date of Birth: 17-Sep-1991 [facility] Initial Prenatal Visit  Alicia Dunlap is a 30 y.o. year old G3P2002 at [redacted]w[redacted]d who presents for her initial prenatal visit.  Pregnancy is not planned She reports nausea. Taking Zofran as needed, but not every day She is taking a prenatal vitamin.  She denies pelvic pain or vaginal bleeding.  Recent Covid infection, now resolved. Reports flare of eczema on her hands, asking for refill of triamcinolone ointment. Frequently using hand sanitizer at work  Pregnancy Dating: The patient is dated by [place].  LMP: 07/01/21 Period is certain:  Yes.  Periods were regular:  Yes.  LMP was a typical period:  No. Came early Using hormonal contraception in 3 months prior to conception: No  Lab Review: Blood type: A Rh Status: + Antibody screen: Negative HIV: Negative RPR: Negative Hemoglobin electrophoresis reviewed: Yes - known sickle cell trait; partner has never been tested and will not get tested Results of OB urine culture are: Negative Rubella: Immune Hep C Ab: Negative Varicella status is Immune  PMH: Reviewed and as detailed below: HTN: No  Gestational Hypertension/preeclampsia: No  Type 1 or 2 Diabetes: No  Depression:  No  Seizure disorder:  No VTE: No ,  History of STI Yes, genital herpes Abnormal Pap smear:  No, Genital herpes simplex:  Yes   PSH: Gynecologic Surgery:  no Surgical history reviewed, notable for: none  Obstetric History: Obstetric history tab updated and reviewed.  Summary of prior pregnancies: two prior NSVD, IOL for post-dates for most recent pregnancy Cesarean delivery: No  Gestational Diabetes:  No Hypertension in pregnancy: No History of preterm birth: No History of LGA/SGA infant:  No History of shoulder dystocia: No Indications for referral were reviewed, and the patient has no obstetric indications for referral to High Risk OB Clinic at this time.   Social  History: Partner's name: Zetta Bills  Tobacco use: No Alcohol use:  No Other substance use:  No  Current Medications:  Zofran prn Tylenol prn Prenatal vitamin  Reviewed and appropriate in pregnancy.   Genetic and Infection Screen: Flow Sheet Updated Yes  Prenatal Exam: Gen: Well nourished, well developed.  No distress.  Vitals noted. HEENT: Normocephalic, atraumatic.  Neck supple without cervical lymphadenopathy, thyromegaly or thyroid nodules.  Fair dentition. CV: RRR no murmur, gallops or rubs Lungs: CTA B.  Normal respiratory effort without wheezes or rales. Abd: soft, NTND. +BS.  Uterus not appreciated above pelvis. GU: Deferred. Ext: No clubbing, cyanosis or edema. Derm: eczematous changes on dorsum of right hand without skin breakdown or erythema Psych: Normal grooming and dress.  Not depressed or anxious appearing.  Normal thought content and process without flight of ideas or looseness of associations  Fetal heart tones: Appropriate  Assessment/Plan:  Alicia Dunlap is a 30 y.o. G3P2002 at [redacted]w[redacted]d who presents to initiate prenatal care. She is doing well.  Current pregnancy issues include obesity, sickle cell trait, nausea.  Routine prenatal care: As dating is not reliable, a dating ultrasound had already been ordered. Dating tab updated. Pre-pregnancy weight updated. Expected weight gain this pregnancy is 11-20 pounds  Prenatal labs reviewed, notable for sickle cell trait. Indications for referral to HROB were reviewed and the patient does not meet criteria for referral.  Medication list reviewed and updated.  Recommended patient see a dentist for regular care.  Bleeding and pain precautions reviewed. Importance of prenatal vitamins reviewed.  Genetic screening offered. Patient opted for:  cell free DNA, ordered today. The patient has the following indications for aspirinto begin 81 mg at 12-16 weeks: One high risk condition: no single high risk condition  MORE  than one moderate risk condition: obesity and identifies as African American  Aspirin was  recommended today based upon above risk factors (one high risk condition or more than one moderate risk factor) but patient declined The patient will not be age 70 or over at time of delivery. Referral to genetic counseling was not offered today.  The patient has the following risk factors for preexisting diabetes: BMI > 25 and high risk ethnicity (Latino, [place] American, Native American, Mal[place] Islander, Asian Naval architect) . An early 1 hour glucose tolerance test was not ordered. Patient declined 1 hr gtt. Pregnancy Medical Home and PHQ-9 forms completed, problems noted: Yes  2. Pregnancy issues include the following which were addressed today:  Nausea - d/c Zofran, start Diclegis Recent Covid infection during 1st trimester, resolved. Patient declined Covid booster. Genital herpes - will need viral suppression at 36 weeks   Eczema - recommended Vaseline, triamcinolone ointment refilled  Follow up 4 weeks for next prenatal visit.

## 2021-09-26 ENCOUNTER — Other Ambulatory Visit (HOSPITAL_BASED_OUTPATIENT_CLINIC_OR_DEPARTMENT_OTHER): Payer: Self-pay

## 2021-09-26 ENCOUNTER — Ambulatory Visit (INDEPENDENT_AMBULATORY_CARE_PROVIDER_SITE_OTHER): Payer: Medicaid Other | Admitting: Family Medicine

## 2021-09-26 ENCOUNTER — Other Ambulatory Visit: Payer: Self-pay

## 2021-09-26 VITALS — BP 118/89 | HR 82 | Wt 235.4 lb

## 2021-09-26 DIAGNOSIS — D573 Sickle-cell trait: Secondary | ICD-10-CM

## 2021-09-26 DIAGNOSIS — B009 Herpesviral infection, unspecified: Secondary | ICD-10-CM

## 2021-09-26 DIAGNOSIS — Z31430 Encounter of female for testing for genetic disease carrier status for procreative management: Secondary | ICD-10-CM | POA: Diagnosis not present

## 2021-09-26 DIAGNOSIS — L301 Dyshidrosis [pompholyx]: Secondary | ICD-10-CM

## 2021-09-26 DIAGNOSIS — Z3492 Encounter for supervision of normal pregnancy, unspecified, second trimester: Secondary | ICD-10-CM

## 2021-09-26 MED ORDER — DOXYLAMINE-PYRIDOXINE 10-10 MG PO TBEC
DELAYED_RELEASE_TABLET | ORAL | 0 refills | Status: DC
  Filled 2021-09-26: qty 60, 15d supply, fill #0

## 2021-09-26 MED ORDER — DOXYLAMINE-PYRIDOXINE 10-10 MG PO TBEC: DELAYED_RELEASE_TABLET | ORAL | 0 refills | Status: DC

## 2021-09-26 MED ORDER — TRIAMCINOLONE ACETONIDE 0.5 % EX OINT: 1.0000 "application " | TOPICAL_OINTMENT | Freq: Two times a day (BID) | CUTANEOUS | 3 refills | Status: DC

## 2021-09-26 MED ORDER — PRENATAL MULTIVITAMIN CH: 1.0000 | ORAL_TABLET | Freq: Every day | ORAL | 2 refills | Status: DC

## 2021-09-26 MED ORDER — PRENATAL MULTIVITAMIN CH
1.0000 | ORAL_TABLET | Freq: Every day | ORAL | 2 refills | Status: DC
  Filled 2021-09-26: qty 90, 90d supply, fill #0

## 2021-09-26 MED ORDER — TRIAMCINOLONE ACETONIDE 0.5 % EX OINT
1.0000 "application " | TOPICAL_OINTMENT | Freq: Two times a day (BID) | CUTANEOUS | 3 refills | Status: DC
  Filled 2021-09-26: qty 60, 30d supply, fill #0

## 2021-09-26 NOTE — Patient Instructions (Addendum)
It was nice seeing you today!  Try Diclegis for nauea. Start with 1 tablet a day. You can ramp up to 4 tablets a day if symptoms are not controlled (1 in the morning, 1 mid-day, and 2 at night). Instructions on the bottle.  Next prenatal visit in 4 weeks.  Please arrive at least 15 minutes prior to your scheduled appointments.  Stay well, [name], MD [facility] [phone]

## 2021-09-27 ENCOUNTER — Other Ambulatory Visit (HOSPITAL_BASED_OUTPATIENT_CLINIC_OR_DEPARTMENT_OTHER): Payer: Self-pay

## 2021-09-27 DIAGNOSIS — Z3493 Encounter for supervision of normal pregnancy, unspecified, third trimester: Secondary | ICD-10-CM | POA: Insufficient documentation

## 2021-09-27 DIAGNOSIS — Z3492 Encounter for supervision of normal pregnancy, unspecified, second trimester: Secondary | ICD-10-CM | POA: Insufficient documentation

## 2021-09-28 ENCOUNTER — Telehealth: Payer: Self-pay

## 2021-09-28 ENCOUNTER — Other Ambulatory Visit (HOSPITAL_BASED_OUTPATIENT_CLINIC_OR_DEPARTMENT_OTHER): Payer: Self-pay

## 2021-09-28 MED ORDER — PRENATAL MULTIVITAMIN CH: 1.0000 | ORAL_TABLET | Freq: Every day | ORAL | 2 refills | Status: DC

## 2021-09-28 NOTE — Telephone Encounter (Signed)
Patient calls nurse line stating her pharmacy did not receive prenatal vitamin prescription.   Prescription was set to print during OV.   Medication resent.

## 2021-10-05 ENCOUNTER — Other Ambulatory Visit: Payer: Self-pay

## 2021-10-05 MED ORDER — PRENATAL MULTIVITAMIN CH: 1.0000 | ORAL_TABLET | Freq: Every day | ORAL | 2 refills | Status: DC

## 2021-10-05 NOTE — Telephone Encounter (Signed)
Rx continues to revert to print. Called [facility] on [address] with verbal order for prenatal vitamin #90, 2 refills.   Veronda Prude, RN

## 2021-10-13 DIAGNOSIS — Z419 Encounter for procedure for purposes other than remedying health state, unspecified: Secondary | ICD-10-CM | POA: Diagnosis not present

## 2021-10-27 ENCOUNTER — Ambulatory Visit (INDEPENDENT_AMBULATORY_CARE_PROVIDER_SITE_OTHER): Payer: Medicaid Other | Admitting: Family Medicine

## 2021-10-27 ENCOUNTER — Other Ambulatory Visit: Payer: Self-pay

## 2021-10-27 ENCOUNTER — Encounter: Payer: Self-pay | Admitting: Family Medicine

## 2021-10-27 VITALS — BP 128/72 | HR 80 | Wt 240.6 lb

## 2021-10-27 DIAGNOSIS — D573 Sickle-cell trait: Secondary | ICD-10-CM

## 2021-10-27 DIAGNOSIS — Z3492 Encounter for supervision of normal pregnancy, unspecified, second trimester: Secondary | ICD-10-CM

## 2021-10-27 MED ORDER — DOXYLAMINE-PYRIDOXINE 10-10 MG PO TBEC: DELAYED_RELEASE_TABLET | ORAL | 0 refills | Status: DC

## 2021-10-27 MED ORDER — ONDANSETRON 4 MG PO TBDP: 4.0000 mg | ORAL_TABLET | Freq: Three times a day (TID) | ORAL | 0 refills | Status: DC | PRN

## 2021-10-27 NOTE — Patient Instructions (Addendum)
It was nice seeing you today!  Go to the MAU for any bleeding, leakage of fluid, or severe abdominal cramping that does not go away.  Next visit in 4 weeks.  Anatomy scan as scheduled.  Please arrive at least 15 minutes prior to your scheduled appointments.  Stay well, Alicia Deeds, MD [facility] [phone]

## 2021-10-27 NOTE — Progress Notes (Addendum)
°  Patient Name: [name] Date of Birth: 10-08-1991 [facility] Prenatal Visit  Alicia Dunlap is a 30 y.o. G3P2002 at [redacted]w[redacted]d here for routine follow up. She is dated by early ultrasound.  She reports headache, nausea, no bleeding, no contractions, and no leaking.  She denies vaginal bleeding.  See flow sheet for details. Here today with her 2 daughters. Never picked up Diclegis.  Asking for Zofran.  Willing to try Diclegis as well. Has had occasional severe cramps which do not persist. Headaches, relieved with Tylenol but tries not to take it  Vitals:   10/27/21 1546  BP: 128/72  Pulse: 80     A/P: Pregnancy at [redacted]w[redacted]d.  Doing well.    Routine Prenatal Care:  Dating reviewed, dating tab is correct Fetal heart tones Appropriate Influenza vaccine not administered as patient declined, will continue to discuss.   COVID vaccination was discussed and declined.  The patient has the following indication for screening preexisting diabetes: BMI > 25 and high risk ethnicity (Latino, [place] American, Native American, [place] Islander, Asian Naval architect) . Patient declined early 1 hr gtt. Anatomy ultrasound ordered to be scheduled at 18-20 weeks. NIPS low risk Pregnancy education including expected weight gain in pregnancy, OTC medication use, continued use of prenatal vitamin, smoking cessation if applicable, and nutrition in pregnancy.   Bleeding and pain precautions reviewed. The patient has the following indications for aspirinto begin 81 mg at 12-16 weeks: One high risk condition: no single high risk condition  MORE than one moderate risk condition: obesity and identifies as African American  Aspirin was  recommended today based upon above risk factors (one high risk condition or more than one moderate risk factor) - patient declined  2. Pregnancy issues include the following and were addressed as appropriate today:  Nausea - resent Diclegis, rx Zofran ODT prn Sickle  cell trait Genital herpes - needs valacyclovir at 36 weeks Problem list  and pregnancy box updated: Yes.   Follow up 4 weeks.

## 2021-11-13 DIAGNOSIS — Z419 Encounter for procedure for purposes other than remedying health state, unspecified: Secondary | ICD-10-CM | POA: Diagnosis not present

## 2021-11-13 NOTE — L&D Delivery Note (Addendum)
Patient: Alicia Dunlap MRN: [id number]  GBS status: Neg  Patient is a 31 y.o. now G3P3003 s/p NSVD at [redacted]w[redacted]d, who was admitted for IOL for post dates. AROM 2h 38m prior to delivery with clear fluid.  Delivery Note At 11:25 PM a viable female was delivered via Vaginal, Spontaneous (Presentation: Left Occiput Anterior).  APGAR: 9, 9; weight pending.    Head delivered LOA. No nuchal cord present. Shoulder and body delivered in usual fashion. Infant with spontaneous cry, placed on mother's abdomen, dried and bulb suctioned. Cord clamped x 2 after 1-minute delay and cut by FOB under supervision. Cord blood drawn. Placenta delivered spontaneously with gentle cord traction. Fundus firm with massage and Pitocin. Perineum inspected and patient was found to have a 1st degree perineal laceration, which was repaired with 3.0 Vicryl with good hemostasis achieved.  Placenta status: Spontaneous, Intact.  Cord: 3 vessels with the following complications: None.    Anesthesia: Epidural Episiotomy: None Lacerations: 1st degree perineal  Suture Repair: 3.0 Vicryl Est. Blood Loss (mL): 75 cc  Mom to postpartum.  Baby to Couplet care / Skin to Skin.  Derrel Nip 04/12/2022, 11:43 PM  GME ATTESTATION:  I saw and evaluated the patient. I was present and gloved for the entire delivery, laceration repair, and management of the patient. I agree with the findings and the plan of care as documented in the resident's note. I have made changes to documentation as necessary.  Evalina Field, MD OB Fellow, Faculty [facility], Center for [facility] 04/12/2022 11:54 PM

## 2021-11-18 ENCOUNTER — Telehealth: Payer: Self-pay

## 2021-11-18 NOTE — Telephone Encounter (Signed)
Patient calls nurse line regarding nausea, vomiting and diarrhea. Patient reports GI upset occurring after eating crab legs and empanadas last night at dinner.   Reports waking up this morning and having one episode of emesis and three episodes of diarrhea. Patient reports she has been drinking plenty of water today and is able to hold down fluids. States that she has "stomach ache, like when you eat something that upsets your stomach," however, denies pain. Denies vaginal bleeding or cramping, or fever. Patient also reports having headache earlier that she treated with tylenol.   Advised patient to continue drinking plenty of fluids and advance diet at tolerated. Patient will go to MAU if she is unable to hold down liquids, begins experiencing severe abdominal pain, vaginal bleeding, high fevers, or headaches that are not resolved by tylenol.   Veronda Prude, RN

## 2021-11-24 ENCOUNTER — Other Ambulatory Visit: Payer: Self-pay

## 2021-11-24 ENCOUNTER — Ambulatory Visit (INDEPENDENT_AMBULATORY_CARE_PROVIDER_SITE_OTHER): Payer: Medicaid Other | Admitting: Family Medicine

## 2021-11-24 ENCOUNTER — Other Ambulatory Visit: Payer: Self-pay | Admitting: Family Medicine

## 2021-11-24 ENCOUNTER — Ambulatory Visit: Payer: Medicaid Other | Attending: Family Medicine

## 2021-11-24 VITALS — BP 123/73 | HR 90 | Wt 238.8 lb

## 2021-11-24 DIAGNOSIS — Z3A20 20 weeks gestation of pregnancy: Secondary | ICD-10-CM | POA: Insufficient documentation

## 2021-11-24 DIAGNOSIS — Z363 Encounter for antenatal screening for malformations: Secondary | ICD-10-CM | POA: Diagnosis not present

## 2021-11-24 DIAGNOSIS — E668 Other obesity: Secondary | ICD-10-CM

## 2021-11-24 DIAGNOSIS — Z3492 Encounter for supervision of normal pregnancy, unspecified, second trimester: Secondary | ICD-10-CM

## 2021-11-24 DIAGNOSIS — Z3689 Encounter for other specified antenatal screening: Secondary | ICD-10-CM | POA: Diagnosis not present

## 2021-11-24 DIAGNOSIS — Z862 Personal history of diseases of the blood and blood-forming organs and certain disorders involving the immune mechanism: Secondary | ICD-10-CM | POA: Insufficient documentation

## 2021-11-24 DIAGNOSIS — O99212 Obesity complicating pregnancy, second trimester: Secondary | ICD-10-CM | POA: Insufficient documentation

## 2021-11-24 MED ORDER — ASPIRIN EC 81 MG PO TBEC: 81.0000 mg | DELAYED_RELEASE_TABLET | Freq: Every day | ORAL | 11 refills | Status: DC

## 2021-11-24 NOTE — Patient Instructions (Signed)
It was great seeing you today!  I have no concerns regarding your your baby at this time.  I would like for you to continue taking the prenatal vitamin and start taking an 81 mg aspirin daily.  We are scheduling you for a visit in 4 weeks and you will need your 1 hour glucose tolerance test at that time.  If you have any vaginal bleeding, contractions, gush of fluid between now and then please go be evaluated at the maternal assessment unit in the bottom of the [facility].  If you have any questions or concerns call the clinic.  I hope you have a wonderful afternoon!

## 2021-11-24 NOTE — Progress Notes (Signed)
°  Patient Name: [name] Date of Birth: 08-Apr-1991 [facility] Prenatal Visit  Alicia Dunlap is a 31 y.o. G3P2002 at [redacted]w[redacted]d here for routine follow up. She is dated by early ultrasound.  She reports no bleeding, no contractions, no cramping, and no leaking.  She denies vaginal bleeding.  See flow sheet for details.  Vitals:   11/24/21 1144  BP: 123/73  Pulse: 90     A/P: Pregnancy at [redacted]w[redacted]d.  Doing well.    Routine Prenatal Care:  Dating reviewed, dating tab is correct Fetal heart tones Appropriate 137 Influenza vaccine not administered as patient declined, will continue to discuss.   COVID vaccination was discussed and patient declined at this time..  The patient has the following indication for screening preexisting diabetes: BMI > 25 and high risk ethnicity (Latino, [place] American, Native American, Mal[place] Islander, Asian Naval architect) .  Patient declined early Glucola test. Anatomy ultrasound scheduled for this afternoon Low risk NIPS Pregnancy education including expected weight gain in pregnancy, OTC medication use, continued use of prenatal vitamin, smoking cessation if applicable, and nutrition in pregnancy.   Bleeding and pain precautions reviewed. The patient has the following indications for aspirinto begin 81 mg at 12-16 weeks: One high risk condition: no single high risk condition  MORE than one moderate risk condition: obesity and identifies as African American  Aspirin was  recommended today based upon above risk factors (one high risk condition or more than one moderate risk factor)   2. Pregnancy issues include the following and were addressed as appropriate today:  Patient had issues with nausea previously.  Patient has Zofran ODT as needed Sickle cell trait-patient reports that the partner will not come to the doctor for testing.  She reports that he will not agree to testing for sickle cell trait but that he has another child who does not have  sickle cell disease.  Discussed the risks but still declines further testing. Patient has history of genital herpes and will need valacyclovir at 36 weeks Patient is scheduled for anatomy scan today after my visit.  We will follow-up at next visit. Patient scheduled for 24-week visit and will need 1 hour glucose tolerance test at that visit. Problem list  and pregnancy box updated: Yes.   Follow up 4 weeks.

## 2021-11-25 ENCOUNTER — Other Ambulatory Visit: Payer: Self-pay | Admitting: *Deleted

## 2021-11-25 DIAGNOSIS — Z6839 Body mass index (BMI) 39.0-39.9, adult: Secondary | ICD-10-CM

## 2021-12-14 DIAGNOSIS — Z419 Encounter for procedure for purposes other than remedying health state, unspecified: Secondary | ICD-10-CM | POA: Diagnosis not present

## 2021-12-21 ENCOUNTER — Ambulatory Visit: Payer: Medicaid Other | Admitting: Family Medicine

## 2021-12-23 ENCOUNTER — Encounter: Payer: Medicaid Other | Admitting: Family Medicine

## 2021-12-23 NOTE — Progress Notes (Deleted)
°  [facility] Prenatal Visit  Alicia Dunlap is a 31 y.o. G3P2002 at [redacted]w[redacted]d here for routine follow up. She is dated by early ultrasound.  She reports {symptoms; pregnancy related:14538}.  She reports good fetal movement. No bleeding, loss of fluid, contractions. See flow sheet for details. There were no vitals filed for this visit.   A/P: Pregnancy at [redacted]w[redacted]d.  Doing well.   Dating reviewed, dating tab is {correct:23336::"correct"} Fetal heart tones {appropriate:23337} Fundal height {fundal height:23342::"within expected range. "} Anatomy ultrasound reviewed and notable for ***.  Influenza vaccine {given:23340}.  COVID vaccination was discussed and ***.  Indications for screening for preexisting diabetes include: BMI > 25 and high risk ethnicity (Latino, [place] American, Native American, [place] Islander, Asian Naval architect) .  Pregnancy education provided on the following topics: fetal growth and movement, ultrasound assessment, and upcoming laboratory assessment.   The patient has the following indications for aspirinto begin 81 mg at 12-16 weeks: One high risk condition: no single high risk condition  MORE than one moderate risk condition: obesity and identifies as African American  Aspirin was  recommended today based upon above risk factors (one high risk condition or more than one moderate risk factor)  Scheduled for [facility] during second trimester on ***. Preterm labor precautions given.   2. Pregnancy issues include the following and were addressed as appropriate today:   ***  Problem list and pregnancy box updated: {yes/no:20286::"Yes"}.     Follow up 4 weeks.

## 2021-12-29 ENCOUNTER — Ambulatory Visit: Payer: Medicaid Other | Attending: Obstetrics

## 2021-12-29 ENCOUNTER — Other Ambulatory Visit: Payer: Self-pay

## 2021-12-29 ENCOUNTER — Ambulatory Visit: Payer: Medicaid Other | Admitting: *Deleted

## 2021-12-29 VITALS — BP 118/75 | HR 95

## 2021-12-29 DIAGNOSIS — O99212 Obesity complicating pregnancy, second trimester: Secondary | ICD-10-CM

## 2021-12-29 DIAGNOSIS — Z862 Personal history of diseases of the blood and blood-forming organs and certain disorders involving the immune mechanism: Secondary | ICD-10-CM | POA: Insufficient documentation

## 2021-12-29 DIAGNOSIS — E668 Other obesity: Secondary | ICD-10-CM | POA: Diagnosis not present

## 2021-12-29 DIAGNOSIS — Z6839 Body mass index (BMI) 39.0-39.9, adult: Secondary | ICD-10-CM

## 2021-12-29 DIAGNOSIS — Z362 Encounter for other antenatal screening follow-up: Secondary | ICD-10-CM | POA: Insufficient documentation

## 2021-12-29 DIAGNOSIS — Z3A25 25 weeks gestation of pregnancy: Secondary | ICD-10-CM | POA: Diagnosis not present

## 2021-12-30 ENCOUNTER — Other Ambulatory Visit: Payer: Self-pay | Admitting: *Deleted

## 2021-12-30 DIAGNOSIS — R638 Other symptoms and signs concerning food and fluid intake: Secondary | ICD-10-CM

## 2022-01-11 DIAGNOSIS — Z419 Encounter for procedure for purposes other than remedying health state, unspecified: Secondary | ICD-10-CM | POA: Diagnosis not present

## 2022-01-26 ENCOUNTER — Other Ambulatory Visit: Payer: Self-pay

## 2022-01-26 ENCOUNTER — Ambulatory Visit (INDEPENDENT_AMBULATORY_CARE_PROVIDER_SITE_OTHER): Payer: Medicaid Other | Admitting: Student

## 2022-01-26 ENCOUNTER — Encounter: Payer: Self-pay | Admitting: Student

## 2022-01-26 VITALS — BP 120/60 | HR 85 | Wt 255.4 lb

## 2022-01-26 DIAGNOSIS — Z3492 Encounter for supervision of normal pregnancy, unspecified, second trimester: Secondary | ICD-10-CM | POA: Diagnosis not present

## 2022-01-26 LAB — POCT 1 HR PRENATAL GLUCOSE: Glucose 1 Hr Prenatal, POC: 120 mg/dL

## 2022-01-26 NOTE — Patient Instructions (Signed)
It was a pleasure to see you today! Thank you for choosing [facility] for your primary care. Alicia Dunlap was seen for OB follow up.  ? ?Our plans for today were: ?I will message you with the lab results  ? ? ?Best,  ?Bianca Vester  ? ? ?Emergency Planning ?If you experience any vaginal bleeding, leakage of fluids, don't feel your baby moving as much, or start to have contractions less than 5 minutes apart please go directly to the Maternal Assessment Unit at [facility] for evaluation. ?  ?Maternity and women's care services located on the [place] side of [facility] [place]. [facility] (Entrance C off [address]).  ?[address] ?[place],  [place]  [address] ?  ?  ?  ?Maternal Mental Health ?If you start to develop the below symptoms of depression, please reach out to [place] for an appointment. There is also a Biomedical scientist Health Hotline at [phone]). This hotline has trained counselors, doulas, and midwifes to real-time support, information, and resources.  ?Feeling sad or hopeless most of the time ?Lack of interest in things you used to enjoy ?Less interest in caring for yourself (dressing, fixing hair) ?Trouble concentrating ?Trouble coping with daily tasks ?Constant worry about your baby ?Sleeping or eating too much or too little ?Feeling very anxious or nervous ?Unexplained irritability or anger ?Unwanted or scary thoughts ?Feeling that you are not a good mother ?Thoughts of hurting yourself or your baby ?  ?If you feel you are experiencing a mental health crisis, please reach out to the National Suicide Prevention Hotline at 1-800-273-TALK 640-077-8642) or go directly to the [facility], open 24/7.  ?([phone] ?[address], [place], [place] [address]  ?  ?Please bring all of your medications to every appointment! ?  ?If you have any questions or concerns, please do not hesitate to contact us  via phone or MyChart message.  ?

## 2022-01-26 NOTE — Progress Notes (Addendum)
?  [facility] Prenatal Visit ? ?Alicia Dunlap is a 31 y.o. G3P2002 at [redacted]w[redacted]d here for routine follow up. She is dated by early ultrasound.  She reports no complaints. She reports fetal movement. She denies vaginal bleeding, contractions, or loss of fluid. See flow sheet for details. ? ?Vitals:  ? 01/26/22 1527  ?BP: 120/60  ?Pulse: 85  ? ? ?A/P: Pregnancy at [redacted]w[redacted]d.  Doing well.   ?Routine prenatal care:  ?Dating reviewed, dating tab is correct ?Fetal heart tones 130s ?Fundal height within expected range.  ?Infant feeding choice: Both  ?Contraception choice: Undecided  -->will continue having discussions about this  ?Infant circumcision desired--> not applicable ?Had anatomy scan--> goes back 02/02/22 as baby was in breech presentation  ? ?The patient does not have a history of Cesarean delivery and no referral to [facility] is indicated ?Influenza vaccine declined  ?Tdap was not given today, declined  ?1 hour glucola, CBC, RPR, and HIV were obtained today.   ? ?Rh status was reviewed and patient does not need Rhogam.  Rhogam was not given today.  ?Pregnancy medical home and PHQ-9 forms were done today and reviewed.   ?Childbirth and education classes were offered.  ?Pregnancy education regarding benefits of breastfeeding, contraception, fetal growth, expected weight gain, and safe infant sleep were discussed.  ?Preterm labor and fetal movement precautions reviewed. ? ?2. Pregnancy issues include the following and were addressed as appropriate today: ? ? Taking ASA  ? Problem list and pregnancy box updated: Yes.  ? ?1 hour glucola was 120 --no need for three hour ?Obtained RPR, HIV, and CBC. Will follow up with patient.  ? ? ?  01/26/2022  ?  3:26 PM 11/24/2021  ? 11:43 AM 09/26/2021  ?  3:19 PM  ?PHQ9 SCORE ONLY  ?PHQ-9 Total Score 0 0 0  ? ? ?Patient scheduled in [facility] during third trimester on April 3rd 2023.  ? ? ?

## 2022-01-27 LAB — RPR: RPR Ser Ql: NONREACTIVE

## 2022-01-27 LAB — CBC
Hematocrit: 31.7 % — ABNORMAL LOW (ref 34.0–46.6)
Hemoglobin: 11.1 g/dL (ref 11.1–15.9)
MCH: 28.5 pg (ref 26.6–33.0)
MCHC: 35 g/dL (ref 31.5–35.7)
MCV: 81 fL (ref 79–97)
Platelets: 282 10*3/uL (ref 150–450)
RBC: 3.9 x10E6/uL (ref 3.77–5.28)
RDW: 12.3 % (ref 11.7–15.4)
WBC: 12.5 10*3/uL — ABNORMAL HIGH (ref 3.4–10.8)

## 2022-01-27 LAB — HIV ANTIBODY (ROUTINE TESTING W REFLEX): HIV Screen 4th Generation wRfx: NONREACTIVE

## 2022-02-02 ENCOUNTER — Ambulatory Visit: Payer: Medicaid Other | Attending: Obstetrics

## 2022-02-02 ENCOUNTER — Ambulatory Visit: Payer: Medicaid Other | Admitting: *Deleted

## 2022-02-02 ENCOUNTER — Other Ambulatory Visit: Payer: Self-pay

## 2022-02-02 VITALS — BP 121/77 | HR 101

## 2022-02-02 DIAGNOSIS — O99213 Obesity complicating pregnancy, third trimester: Secondary | ICD-10-CM | POA: Diagnosis not present

## 2022-02-02 DIAGNOSIS — Z3A30 30 weeks gestation of pregnancy: Secondary | ICD-10-CM | POA: Diagnosis not present

## 2022-02-02 DIAGNOSIS — E669 Obesity, unspecified: Secondary | ICD-10-CM

## 2022-02-02 DIAGNOSIS — R638 Other symptoms and signs concerning food and fluid intake: Secondary | ICD-10-CM | POA: Diagnosis not present

## 2022-02-03 ENCOUNTER — Other Ambulatory Visit: Payer: Self-pay | Admitting: *Deleted

## 2022-02-03 DIAGNOSIS — R638 Other symptoms and signs concerning food and fluid intake: Secondary | ICD-10-CM

## 2022-02-03 DIAGNOSIS — Z3689 Encounter for other specified antenatal screening: Secondary | ICD-10-CM

## 2022-02-03 DIAGNOSIS — Z862 Personal history of diseases of the blood and blood-forming organs and certain disorders involving the immune mechanism: Secondary | ICD-10-CM

## 2022-02-11 DIAGNOSIS — Z419 Encounter for procedure for purposes other than remedying health state, unspecified: Secondary | ICD-10-CM | POA: Diagnosis not present

## 2022-02-16 ENCOUNTER — Other Ambulatory Visit: Payer: Self-pay | Admitting: Family Medicine

## 2022-02-16 ENCOUNTER — Other Ambulatory Visit: Payer: Self-pay

## 2022-02-16 ENCOUNTER — Ambulatory Visit (INDEPENDENT_AMBULATORY_CARE_PROVIDER_SITE_OTHER): Payer: Medicaid Other | Admitting: Family Medicine

## 2022-02-16 ENCOUNTER — Other Ambulatory Visit (HOSPITAL_COMMUNITY): Payer: Self-pay

## 2022-02-16 VITALS — BP 127/68 | HR 102 | Wt 256.4 lb

## 2022-02-16 DIAGNOSIS — Z23 Encounter for immunization: Secondary | ICD-10-CM | POA: Diagnosis not present

## 2022-02-16 DIAGNOSIS — O99891 Other specified diseases and conditions complicating pregnancy: Secondary | ICD-10-CM

## 2022-02-16 DIAGNOSIS — B009 Herpesviral infection, unspecified: Secondary | ICD-10-CM

## 2022-02-16 DIAGNOSIS — O98513 Other viral diseases complicating pregnancy, third trimester: Secondary | ICD-10-CM

## 2022-02-16 DIAGNOSIS — M7918 Myalgia, other site: Secondary | ICD-10-CM

## 2022-02-16 DIAGNOSIS — Z3492 Encounter for supervision of normal pregnancy, unspecified, second trimester: Secondary | ICD-10-CM

## 2022-02-16 DIAGNOSIS — Z3483 Encounter for supervision of other normal pregnancy, third trimester: Secondary | ICD-10-CM

## 2022-02-16 MED ORDER — ACETAMINOPHEN 500 MG PO TABS
1000.0000 mg | ORAL_TABLET | Freq: Four times a day (QID) | ORAL | 3 refills | Status: DC | PRN
  Filled 2022-02-16: qty 30, 4d supply, fill #0

## 2022-02-16 MED ORDER — VALACYCLOVIR HCL 500 MG PO TABS: 500.0000 mg | ORAL_TABLET | Freq: Two times a day (BID) | ORAL | 0 refills | Status: DC

## 2022-02-16 NOTE — Progress Notes (Signed)
[facility] ?[facility] Visit ? ?Alicia Dunlap is a 31 y.o. G3P2002 at [redacted]w[redacted]d (via 6w sono) who presents to [facility] for routine follow up. Prenatal course, history, notes, ultrasounds, and laboratory results reviewed. ? ?Denies cramping/ctx, fluid leaking, vaginal bleeding, or decreased fetal movement. Taking PNV. Does admit to pain in left lower back, also pain in R foot. On Thursday night 1 week ago she stepped on something sharp, had pain since then. Swelling improving but pain still present. ? ?Postpartum Plans: ?- delivery planning: SVD with epidural ?- circumcision: n/a ?- feeding: breast & formula ?- pediatrician: [facility] ? ?FHR: 140bpm ?Uterine size: 33cm ? ?R foot with healing small puncture type wound on forefoot, no signs of infection, no warmth or drainage, no erythema ?Full strenght bilateral lower extremities, sensation intact over bilateral lower extremities   ? ?Assessment & Plan ? ?1. Routine prenatal care: ?- due for pap but will defer to 36wk visit since she will have pelvic exam at that visit anyways ?- Tdap given today ?- provided letter for employer documenting her pregnancy and due date ?- continue asa for pre-E ppx  ?- declines COVID booster at this time ?- continue growth scans with MFM due to elevated BMI ? ?2. Pubic symphysis dysfunction ?- refer to physical therapy as she required this after her last pregnancy ?- discussed support band ? ?3. History of HSV - advised to begin valtrex at 36wks, sent in rx so she has it on hand ? ?4. Foot wound - no signs of infection on exam today, doubt need for antibiotics at this time. Advised to monitor closely. Updating Tdap today. ?- discussed return precautions ? ?5. Sickle cell trait - reports FOB previously tested & negative for trait ? ?Next prenatal visit in 2 weeks. Labor & fetal movement precautions discussed. ? ?Levert Feinstein, MD ?[facility]  ?

## 2022-02-16 NOTE — Patient Instructions (Signed)
It was nice to meet you today! ? ?Go to the MAU at [facility] at [facility] if: ?You have cramping/contractions that do not go away with drinking water ?Your water breaks.  Sometimes it is a big gush of fluid, sometimes it is just a trickle that keeps getting your underwear wet or running down your legs ?You have vaginal bleeding.    ?You do not feel your baby moving like normal.  If you do not, get something to eat and drink and lay down and focus on feeling your baby move. If your baby is still not moving like normal, you should go to MAU.  ? ?Referring to physical therapy ?Get maternity support belt ?Pap smear at future visit ?Tetanus shot today ? ?Watch foot, let us know if worsening or if fevers, swelling, drainage, etc. ? ?Be well, ?Dr. Pollie Meyer  ? ? ?

## 2022-02-26 DIAGNOSIS — B009 Herpesviral infection, unspecified: Secondary | ICD-10-CM | POA: Insufficient documentation

## 2022-03-01 ENCOUNTER — Ambulatory Visit (INDEPENDENT_AMBULATORY_CARE_PROVIDER_SITE_OTHER): Payer: Medicaid Other | Admitting: Family Medicine

## 2022-03-01 DIAGNOSIS — Z3492 Encounter for supervision of normal pregnancy, unspecified, second trimester: Secondary | ICD-10-CM

## 2022-03-01 NOTE — Progress Notes (Signed)
  [place] Prenatal Visit  Alicia Dunlap is a 31 y.o. G3P2002 at [redacted]w[redacted]d here for routine follow up. She is dated by early ultrasound.  She reports no bleeding, no contractions, no cramping, and no leaking.  She reports fetal movement. She denies vaginal bleeding, contractions, or loss of fluid.  See flow sheet for details.  There were no vitals filed for this visit.   A/P: Pregnancy at [redacted]w[redacted]d.  Doing well.   Routine prenatal care:  Dating reviewed, dating tab is correct Fetal heart tones: Appropriate151 Fundal height: within expected range.  The patient does have a history of HSV and valacyclovir is not indicated at this time. Will start at 36 week The patient does not have a history of Cesarean delivery and no referral to [facility] [place] is indicated Infant feeding choice: Both  Contraception choice: Undecided  Infant circumcision desired not applicable Influenza vaccine not administered as not influenza season.   Tdap was not given today.  Previously given COVID vaccination was discussed and declined.  Childbirth and education classes were offered. Pregnancy education regarding benefits of breastfeeding, contraception, fetal growth, expected weight gain, and safe infant sleep were discussed.  Preterm labor and fetal movement precautions reviewed.   2. Pregnancy issues include the following and were addressed as appropriate today:  Routine prenatal care Patient is due for a Pap smear but will do at 36-week visit Tdap given at 32-week visit Continue ASA for preeclampsia prophylaxis Declines COVID booster Continue growth scans with MFM due to elevated BMI Patient will need GBS as well as gonorrhea/chlamydia collected at next visit.  Can also collect Pap smear at that time Pubic symphysis dysfunction Patient has physical therapy referral in place.  Will need after pregnancy History of HSV Patient has prescription for Valtrex to begin at 36 weeks Sickle  cell trait FOB has been tested and negative for the trait   Problem list and pregnancy box updated: Yes.    Follow up 2 weeks.

## 2022-03-01 NOTE — Patient Instructions (Signed)
It was great seeing you today!  I am glad you are doing so well and I have no concerns at this time.  If you have any contractions, vaginal bleeding, gush of fluid, or feel decreased baby movement between now and your next visit please be assessed at the maternal assessment unit.  I will see you in approximately 2 weeks.  Please be sure to start the medication we prescribed at 36 weeks.  I hope you have a wonderful day! ? ? ?

## 2022-03-10 ENCOUNTER — Ambulatory Visit: Payer: Medicaid Other | Admitting: *Deleted

## 2022-03-10 ENCOUNTER — Ambulatory Visit: Payer: Medicaid Other | Attending: Obstetrics

## 2022-03-10 VITALS — BP 128/79 | HR 104

## 2022-03-10 DIAGNOSIS — O285 Abnormal chromosomal and genetic finding on antenatal screening of mother: Secondary | ICD-10-CM | POA: Diagnosis not present

## 2022-03-10 DIAGNOSIS — E669 Obesity, unspecified: Secondary | ICD-10-CM | POA: Diagnosis not present

## 2022-03-10 DIAGNOSIS — Z3A35 35 weeks gestation of pregnancy: Secondary | ICD-10-CM

## 2022-03-10 DIAGNOSIS — Z862 Personal history of diseases of the blood and blood-forming organs and certain disorders involving the immune mechanism: Secondary | ICD-10-CM

## 2022-03-10 DIAGNOSIS — D573 Sickle-cell trait: Secondary | ICD-10-CM

## 2022-03-10 DIAGNOSIS — O99213 Obesity complicating pregnancy, third trimester: Secondary | ICD-10-CM

## 2022-03-10 DIAGNOSIS — R638 Other symptoms and signs concerning food and fluid intake: Secondary | ICD-10-CM | POA: Diagnosis not present

## 2022-03-10 DIAGNOSIS — Z3689 Encounter for other specified antenatal screening: Secondary | ICD-10-CM | POA: Diagnosis not present

## 2022-03-13 DIAGNOSIS — Z419 Encounter for procedure for purposes other than remedying health state, unspecified: Secondary | ICD-10-CM | POA: Diagnosis not present

## 2022-03-14 ENCOUNTER — Other Ambulatory Visit (HOSPITAL_COMMUNITY)
Admission: RE | Admit: 2022-03-14 | Discharge: 2022-03-14 | Disposition: A | Discharge status: Home / Self Care | Payer: Medicaid Other | Source: Ambulatory Visit | Attending: Family Medicine | Admitting: Family Medicine

## 2022-03-14 ENCOUNTER — Ambulatory Visit (INDEPENDENT_AMBULATORY_CARE_PROVIDER_SITE_OTHER): Payer: Medicaid Other | Admitting: Family Medicine

## 2022-03-14 VITALS — BP 120/74 | HR 97 | Wt 262.0 lb

## 2022-03-14 DIAGNOSIS — Z3483 Encounter for supervision of other normal pregnancy, third trimester: Secondary | ICD-10-CM | POA: Diagnosis not present

## 2022-03-14 DIAGNOSIS — Z3493 Encounter for supervision of normal pregnancy, unspecified, third trimester: Secondary | ICD-10-CM

## 2022-03-14 MED ORDER — LORATADINE 10 MG PO TABS: 10.0000 mg | ORAL_TABLET | Freq: Every day | ORAL | 11 refills | Status: DC

## 2022-03-14 NOTE — Patient Instructions (Signed)
It was good seeing you today.  I have no concerns at this time.  Be sure to continue to take your Valtrex as well as your aspirin and prenatal vitamin.  Below is a list of medications that are safe for pregnancy.  If you have any vaginal bleeding, contractions, gush of fluid, feel decreased fetal movement between now and your next appointment please be evaluated at the maternal assessment unit.  Let me know if you have any questions or issues.  I hope you have a great day! ? ? ?Safe Medications in Pregnancy  ? ?Acne:  ?Benzoyl Peroxide  ?Salicylic Acid  ? ?Backache/Headache:  ?Tylenol: 2 regular strength every 4 hours OR  ?             2 Extra strength every 6 hours  ? ?Colds/Coughs/Allergies:  ?Benadryl (alcohol free) 25 mg every 6 hours as needed  ?Breath right strips  ?Claritin  ?Cepacol throat lozenges  ?Chloraseptic throat spray  ?Cold-Eeze- up to three times per day  ?Cough drops, alcohol free  ?Flonase (by prescription only)  ?Guaifenesin  ?Mucinex  ?Robitussin DM (plain only, alcohol free)  ?Saline nasal spray/drops  ?Sudafed (pseudoephedrine) & Actifed * use only after [redacted] weeks gestation and if you do not have high blood pressure  ?Tylenol  ?Vicks Vaporub  ?Zinc lozenges  ?Zyrtec  ? ?Constipation:  ?Colace  ?Ducolax suppositories  ?Fleet enema  ?Glycerin suppositories  ?Metamucil  ?Milk of magnesia  ?Miralax  ?Senokot  ?Smooth move tea  ? ?Diarrhea:  ?Kaopectate  ?Imodium A-D  ? ?*NO pepto Bismol  ? ?Hemorrhoids:  ?Anusol  ?Anusol HC  ?Preparation H  ?Tucks  ? ?Indigestion:  ?Tums  ?Maalox  ?Mylanta  ?Zantac  ?Pepcid  ? ?Insomnia:  ?Benadryl (alcohol free) 25mg  every 6 hours as needed  ?Tylenol PM  ?Unisom, no Gelcaps  ? ?Leg Cramps:  ?Tums  ?MagGel  ? ?Nausea/Vomiting:  ?Bonine  ?Dramamine  ?Emetrol  ?Ginger extract  ?Sea bands  ?Meclizine  ?Nausea medication to take during pregnancy:  ?Unisom (doxylamine succinate 25 mg tablets) Take one tablet daily at bedtime. If symptoms are not adequately controlled,  the dose can be increased to a maximum recommended dose of two tablets daily (1/2 tablet in the morning, 1/2 tablet mid-afternoon and one at bedtime).  ?Vitamin B6 100mg  tablets. Take one tablet twice a day (up to 200 mg per day).  ? ?Skin Rashes:  ?Aveeno products  ?Benadryl cream or 25mg  every 6 hours as needed  ?Calamine Lotion  ?1% cortisone cream  ? ?Yeast infection:  ?Gyne-lotrimin 7  ?Monistat 7  ? ? ?**If taking multiple medications, please check labels to avoid duplicating the same active ingredients  ?**take medication as directed on the label  ?** Do not exceed 4000 mg of tylenol in 24 hours  ?**Do not take medications that contain aspirin or ibuprofen  ?    ?   ? ? ?

## 2022-03-14 NOTE — Progress Notes (Addendum)
?  [facility] Prenatal Visit ? ?[name] is a 31 y.o. G3P2002 at [redacted]w[redacted]d here for routine follow up. She is dated by early ultrasound.  She reports no bleeding, no contractions, no cramping, and no leaking. She reports fetal movement. She denies vaginal bleeding, contractions, or loss of fluid. See flow sheet for details. ? ?There were no vitals filed for this visit. ? ?A/P: Pregnancy at [redacted]w[redacted]d.  Doing well.   ?Routine prenatal care  ?Dating reviewed, dating tab is correct ?Fetal heart tones Appropriate 132 ?Fundal height within expected range.  36 ?Fetal position confirmed Vertex using Ultrasound .  ?GBS collected today. Marland Kitchen  ?Repeat GC/CT collected today.  ?The patient does have a history of HSV and valacyclovir is indicated at this time.  ?Infant feeding choice: Both  ?Contraception choice: Undecided  ?Infant circumcision desired not applicable ?Influenza vaccine not administered as not influenza season.   ?Tdap previously administered between 27-36 weeks  ?COVID vaccination was discussed and declined.  ?Pregnancy education regarding preterm labor, fetal movement,  benefits of breastfeeding, contraception, fetal growth, expected weight gain, and safe infant sleep were discussed.  ? ? ?2. Pregnancy issues include the following and were addressed as appropriate today: ? ?Routine prenatal care ?Patient is due for Pap smear but elected to have it done after delivery ?Tdap given at 32-week visit ?Patient is continuing with ASA for preeclampsia prophylaxis ?Patient continues to decline COVID booster ?Patient had growth scan performed by MFM on 03/10/2022 showing EFW 2861 g (59 percentile) ?GBS as well as gonorrhea/chlamydia collected today ?Pubic symphysis dysfunction ?PT referral in place, will need after pregnancy. ?History of HSV ?Patient started Valtrex prescription yesterday 5/1 when she was [redacted]w[redacted]d, will continue to take daily ?Sickle cell trait ?Reported that FOB has been tested and is negative  for the trait ? Problem list and pregnancy box updated: Yes.  ?Follow up 1 week.  ? ?

## 2022-03-15 LAB — CERVICOVAGINAL ANCILLARY ONLY
Chlamydia: NEGATIVE
Comment: NEGATIVE
Comment: NORMAL
Neisseria Gonorrhea: NEGATIVE

## 2022-03-17 ENCOUNTER — Encounter: Payer: Medicaid Other | Admitting: Family Medicine

## 2022-03-18 LAB — CULTURE, BETA STREP (GROUP B ONLY): Strep Gp B Culture: NEGATIVE

## 2022-03-24 ENCOUNTER — Encounter: Payer: Medicaid Other | Admitting: Family Medicine

## 2022-03-24 ENCOUNTER — Ambulatory Visit (INDEPENDENT_AMBULATORY_CARE_PROVIDER_SITE_OTHER): Payer: Medicaid Other | Admitting: Family Medicine

## 2022-03-24 DIAGNOSIS — Z3493 Encounter for supervision of normal pregnancy, unspecified, third trimester: Secondary | ICD-10-CM

## 2022-03-24 NOTE — Patient Instructions (Addendum)
It was good seeing you today.  I have no concerns regarding how your baby is doing.  Hopefully you will go into labor in the next week and I will do my best to be at the delivery.  Please continue to take the Valtrex as prescribed and aspirin daily.  I have scheduled follow-up in 1 week.  If you have any issues between now and then please be assessed at the maternal assessment unit.  I hope you have a great afternoon! ? ? ?

## 2022-03-24 NOTE — Progress Notes (Signed)
?  [place] Prenatal Visit ? ?Alicia Dunlap is a 31 y.o. G3P2002 at [redacted]w[redacted]d here for routine follow up. She is dated by LMP.  She reports no bleeding, no cramping, no leaking, and occasional contractions. She reports fetal movement. She denies vaginal bleeding, contractions, or loss of fluid. See flow sheet for details. ? ?Vitals:  ? 03/24/22 1136  ?BP: 128/72  ?Pulse: (!) 108  ? ? ?A/P: Pregnancy at [redacted]w[redacted]d.  Doing well.   ?Routine prenatal care  ?Dating reviewed, dating tab is correct ?Fetal heart tones Appropriate 137 ?Fundal height 38within expected range.  ?Fetal position confirmed Vertex using Ultrasound .  ?GBS Negative  ?Repeat GC/CT previously collected, negative ?The patient does have a history of HSV and valacyclovir is indicated at this time.  ?Infant feeding choice: Both  ?Contraception choice: Undecided  ?Infant circumcision desired not applicable ?Influenza vaccine not administered as not influenza season.   ?Tdap previously administered between 27-36 weeks  ?Pregnancy education regarding preterm labor, fetal movement,  benefits of breastfeeding, contraception, fetal growth, expected weight gain, and safe infant sleep were discussed.  ? ? ?2. Pregnancy issues include the following and were addressed as appropriate today: ? ?Routine prenatal care ?Patient needs Pap smear after delivery ?Tdap given at 32-week visit ?Patient is continue with ASA for preeclampsia prophylaxis ?Patient continues decline COVID booster ?Growth scan on 03/10/2022 showing EFW of 2861 g (59th percentile) ?GBS negative ?Gonorrhea/chlamydia negative ?Pubic symphysis dysfunction ?PT referral in place will need after pregnancy ?History of HSV ?Patient currently on Valtrex, will continue to take as prescribed ?Sickle cell trait ?Reported her FOB has been tested and is negative for the trait ? Problem list and pregnancy box updated: Yes.  ?Follow up 1 week.  ? ?

## 2022-03-31 ENCOUNTER — Ambulatory Visit (INDEPENDENT_AMBULATORY_CARE_PROVIDER_SITE_OTHER): Payer: Medicaid Other | Admitting: Family Medicine

## 2022-03-31 ENCOUNTER — Other Ambulatory Visit: Payer: Self-pay

## 2022-03-31 VITALS — BP 110/70 | HR 87 | Wt 262.4 lb

## 2022-03-31 DIAGNOSIS — Z3493 Encounter for supervision of normal pregnancy, unspecified, third trimester: Secondary | ICD-10-CM

## 2022-03-31 NOTE — Patient Instructions (Signed)
It was great seeing you today!  Your baby appears to be doing wonderful and I have no concerns.  Please watch out for any signs of HSV like burning or ulcers.  If you come in in labor we will reassess to determine your need for C-section versus being able to continue with vaginal delivery.  Regarding your induction we did schedule that for 04/15/2022 and someone will call you on Monday to come in for admission.  The week before you will need to have a ultrasound guided BPP.  That has been scheduled.  I have a feeling you will go into labor before then.  If you have any symptoms like contractions, vaginal bleeding, gush of fluid please be evaluated at the maternal assessment unit.  I hope you have a wonderful day!

## 2022-03-31 NOTE — Progress Notes (Signed)
  [facility] Prenatal Visit  Alicia Dunlap is a 31 y.o. G3P2002 at [redacted]w[redacted]d here for routine follow up. She is dated by early ultrasound.  She reports no bleeding, no contractions, no cramping, and no leaking. She reports fetal movement. She denies vaginal bleeding, contractions, or loss of fluid.  Patient does report some burning in her pelvic area.  See flow sheet for details.  Vitals:   03/31/22 0909  BP: 110/70  Pulse: 87    A/P: Pregnancy at [redacted]w[redacted]d.  Doing well.   Routine prenatal care:  Dating reviewed, dating tab is correct Fetal heart tones Appropriate 137 Fundal height within expected range.  Fetal position confirmed Vertex using Leopold's .  Infant feeding choice: Both  Contraception choice: Undecided  Infant circumcision desired not applicable Pain control in labor discussed and patient desires epidural.  Influenza vaccine not administered as not influenza season.   Tdap previously administered between 27-36 weeks  GBS and gc/chlamydia testing results were reviewed today.   Pregnancy education regarding labor, fetal movement,  benefits of breastfeeding, contraception, and safe infant sleep were discussed.  Labor and fetal movement precautions reviewed. Induction of labor discussed. Scheduled for induction at approximately 41 weeks. BPP scheduled between 40-41 weeks.   2. Pregnancy issues include the following and were addressed as appropriate today:  History of HSV Patient reports that she meant to tell me about this at the previous visit but forgot.  Around 5/4 she noticed some burning in her pelvic area where she had shaved.  There were red bumps but no ulcerations or pustules noted.  Felt like it was razor burn.  She is still having a little bit of the burning.  Reports that the bumps resolved.  Has been taking her Valtrex as prescribed.  Physical exam today showing no ulcerations or indication of active HSV.  Discussed case with on-call OB provider who  reported that as long as she has no active lesions or prodromal symptoms at the time of delivery she can still go through with a vaginal birth.  It will have to be reassessed at the time of labor.  Patient is going to continue taking the Valtrex as prescribed.  Will monitor for symptoms. 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   Follow up 1 week.

## 2022-04-03 ENCOUNTER — Encounter (HOSPITAL_COMMUNITY): Payer: Self-pay | Admitting: *Deleted

## 2022-04-03 ENCOUNTER — Telehealth (HOSPITAL_COMMUNITY): Payer: Self-pay | Admitting: *Deleted

## 2022-04-03 NOTE — Telephone Encounter (Signed)
Preadmission screen  

## 2022-04-04 ENCOUNTER — Ambulatory Visit (INDEPENDENT_AMBULATORY_CARE_PROVIDER_SITE_OTHER): Payer: Medicaid Other | Admitting: Family Medicine

## 2022-04-04 VITALS — BP 120/76 | HR 88 | Wt 260.8 lb

## 2022-04-04 DIAGNOSIS — Z3483 Encounter for supervision of other normal pregnancy, third trimester: Secondary | ICD-10-CM

## 2022-04-04 NOTE — Patient Instructions (Addendum)
It was great seeing you today!  Everything looks wonderful and your baby is measuring appropriately.  Your cervical exam showed you were 1 cm dilated and starting to thin out.  If you start having contractions, gush of fluid, any vaginal bleeding I want you to go to the maternal assessment unit immediately.  I scheduled you for 1 last OB visit next week, hopefully you will go into labor before then.  If you have any questions or concerns give the clinic a call and I will get back to you.  I hope you have a great day!

## 2022-04-04 NOTE — Progress Notes (Signed)
  [facility] Prenatal Visit  Alicia Dunlap is a 31 y.o. G3P2002 at [redacted]w[redacted]d here for routine follow up. She is dated by early ultrasound.  She reports no bleeding, no cramping, no leaking, and occasional contractions. She reports fetal movement. She denies vaginal bleeding, contractions, or loss of fluid. See flow sheet for details.  Vitals:   04/04/22 0916  BP: 120/76  Pulse: 88    A/P: Pregnancy at [redacted]w[redacted]d.  Doing well.   Routine prenatal care:  Dating reviewed, dating tab is correct Fetal heart tones Appropriate \ 130 Fundal height within expected range.  Fetal position confirmed Vertex using Ultrasound .  Infant feeding choice: Both  Contraception choice: Undecided  Infant circumcision desired not applicable Pain control in labor discussed and patient desires epidural.  Influenza vaccine not administered as not influenza season.   Tdap previously administered between 27-36 weeks  GBS and gc/chlamydia testing results were reviewed today.   Pregnancy education regarding labor, fetal movement,  benefits of breastfeeding, contraception, and safe infant sleep were discussed.  Labor and fetal movement precautions reviewed. Induction of labor discussed. Scheduled for induction at approximately 41 weeks. BPP scheduled between 40-41 weeks.   2. Pregnancy issues include the following and were addressed as appropriate today:  History of HSV-patient denies any signs of HSV such as prodromal symptoms or lesions.  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   Follow up 1  week.

## 2022-04-10 ENCOUNTER — Other Ambulatory Visit: Payer: Self-pay | Admitting: Obstetrics and Gynecology

## 2022-04-11 ENCOUNTER — Ambulatory Visit (INDEPENDENT_AMBULATORY_CARE_PROVIDER_SITE_OTHER): Payer: Medicaid Other | Admitting: Family Medicine

## 2022-04-11 VITALS — BP 110/80 | HR 86 | Temp 98.4°F | Wt 263.6 lb

## 2022-04-11 DIAGNOSIS — Z3A40 40 weeks gestation of pregnancy: Secondary | ICD-10-CM

## 2022-04-11 DIAGNOSIS — O480 Post-term pregnancy: Secondary | ICD-10-CM

## 2022-04-11 DIAGNOSIS — Z3483 Encounter for supervision of other normal pregnancy, third trimester: Secondary | ICD-10-CM

## 2022-04-11 NOTE — Progress Notes (Unsigned)
  [facility] Prenatal Visit  Alicia Dunlap is a 31 y.o. G3P2002 at [redacted]w[redacted]d here for routine follow up. She is dated by {Ob dating:14516}.  She reports {symptoms; pregnancy related:14538}. She reports fetal movement. She denies vaginal bleeding, contractions, or loss of fluid.  See flow sheet for details.  There were no vitals filed for this visit.   A/P: Pregnancy at [redacted]w[redacted]d.  Doing well.   Routine prenatal care:  Dating reviewed, dating tab is {correct:23336::"correct"} Fetal heart tones {appropriate:23337} Fundal height {fundal height:23342::"within expected range. "} Fetal position confirmed {vertex:23350::"Vertex"}.  Infant feeding choice: {Breasfeeding:23347::"Breastfeeding"} Contraception choice: {postpartum contraception:23348} Infant circumcision desired {Response; yes/no/na:63} Pain control in labor discussed and patient desires ***.  Influenza vaccine {given:23340}  Tdap {Tdap:23345::"previously administered between 27-36 weeks "} Biophysical profile is scheduled between 40w0 day and [redacted]w[redacted]d. Induction of labor discussed with patient. Discussed plan with preceptor and Ob on call. Induction scheduled for 41 weeks on ***.  Labor and fetal movement precautions reviewed.  2. Pregnancy issues include the following and were addressed as appropriate today:   ***  Problem list and pregnancy box updated: {yes/no:20286::"Yes"}.   Scheduled for 6 week postpartum visit.

## 2022-04-11 NOTE — Patient Instructions (Signed)
It was great seeing you today.  We checked her cervix and you are still 1 cm but your cervix was softer than before.  I did my best to irritate it so hopefully you will go into labor soon.  Someone will call me when you come in for delivery and hopefully I will be able to make it to deliver.  If you have any regular contractions, gush of fluid, feel like your baby is not moving normally please be evaluated in the maternal assessment unit.  I hope you have a wonderful day!

## 2022-04-12 ENCOUNTER — Ambulatory Visit (HOSPITAL_BASED_OUTPATIENT_CLINIC_OR_DEPARTMENT_OTHER): Payer: Medicaid Other

## 2022-04-12 ENCOUNTER — Inpatient Hospital Stay (HOSPITAL_COMMUNITY)
Admission: AD | Admit: 2022-04-12 | Discharge: 2022-04-14 | DRG: 806 | Disposition: A | Discharge status: Home / Self Care | Payer: Medicaid Other | Attending: Obstetrics and Gynecology | Admitting: Obstetrics and Gynecology

## 2022-04-12 ENCOUNTER — Ambulatory Visit: Payer: Medicaid Other | Attending: Family Medicine | Admitting: Obstetrics

## 2022-04-12 ENCOUNTER — Other Ambulatory Visit: Payer: Self-pay | Admitting: Family Medicine

## 2022-04-12 ENCOUNTER — Encounter: Payer: Self-pay | Admitting: *Deleted

## 2022-04-12 ENCOUNTER — Encounter (HOSPITAL_COMMUNITY): Payer: Self-pay | Admitting: Family Medicine

## 2022-04-12 ENCOUNTER — Ambulatory Visit: Payer: Medicaid Other | Admitting: *Deleted

## 2022-04-12 ENCOUNTER — Inpatient Hospital Stay (HOSPITAL_COMMUNITY): Payer: Medicaid Other | Admitting: Anesthesiology

## 2022-04-12 ENCOUNTER — Other Ambulatory Visit: Payer: Self-pay

## 2022-04-12 VITALS — BP 116/73 | HR 95

## 2022-04-12 DIAGNOSIS — O480 Post-term pregnancy: Secondary | ICD-10-CM

## 2022-04-12 DIAGNOSIS — A6000 Herpesviral infection of urogenital system, unspecified: Secondary | ICD-10-CM | POA: Diagnosis present

## 2022-04-12 DIAGNOSIS — O99213 Obesity complicating pregnancy, third trimester: Secondary | ICD-10-CM | POA: Insufficient documentation

## 2022-04-12 DIAGNOSIS — O9852 Other viral diseases complicating childbirth: Secondary | ICD-10-CM | POA: Diagnosis not present

## 2022-04-12 DIAGNOSIS — O368130 Decreased fetal movements, third trimester, not applicable or unspecified: Secondary | ICD-10-CM | POA: Diagnosis not present

## 2022-04-12 DIAGNOSIS — Z880 Allergy status to penicillin: Secondary | ICD-10-CM | POA: Diagnosis not present

## 2022-04-12 DIAGNOSIS — Z3A40 40 weeks gestation of pregnancy: Secondary | ICD-10-CM

## 2022-04-12 DIAGNOSIS — Z7982 Long term (current) use of aspirin: Secondary | ICD-10-CM

## 2022-04-12 DIAGNOSIS — O9832 Other infections with a predominantly sexual mode of transmission complicating childbirth: Secondary | ICD-10-CM | POA: Diagnosis present

## 2022-04-12 DIAGNOSIS — Z6839 Body mass index (BMI) 39.0-39.9, adult: Secondary | ICD-10-CM

## 2022-04-12 DIAGNOSIS — D573 Sickle-cell trait: Secondary | ICD-10-CM | POA: Diagnosis not present

## 2022-04-12 DIAGNOSIS — B009 Herpesviral infection, unspecified: Secondary | ICD-10-CM | POA: Diagnosis present

## 2022-04-12 DIAGNOSIS — Z3493 Encounter for supervision of normal pregnancy, unspecified, third trimester: Secondary | ICD-10-CM

## 2022-04-12 DIAGNOSIS — Z419 Encounter for procedure for purposes other than remedying health state, unspecified: Secondary | ICD-10-CM | POA: Diagnosis not present

## 2022-04-12 DIAGNOSIS — O9902 Anemia complicating childbirth: Secondary | ICD-10-CM | POA: Diagnosis present

## 2022-04-12 DIAGNOSIS — Z87891 Personal history of nicotine dependence: Secondary | ICD-10-CM

## 2022-04-12 DIAGNOSIS — O98519 Other viral diseases complicating pregnancy, unspecified trimester: Secondary | ICD-10-CM | POA: Diagnosis present

## 2022-04-12 LAB — CBC
HCT: 37.6 % (ref 36.0–46.0)
Hemoglobin: 13.1 g/dL (ref 12.0–15.0)
MCH: 29.2 pg (ref 26.0–34.0)
MCHC: 34.8 g/dL (ref 30.0–36.0)
MCV: 83.7 fL (ref 80.0–100.0)
Platelets: 285 10*3/uL (ref 150–400)
RBC: 4.49 MIL/uL (ref 3.87–5.11)
RDW: 13 % (ref 11.5–15.5)
WBC: 10.8 10*3/uL — ABNORMAL HIGH (ref 4.0–10.5)
nRBC: 0 % (ref 0.0–0.2)

## 2022-04-12 LAB — TYPE AND SCREEN
ABO/RH(D): A POS
Antibody Screen: NEGATIVE

## 2022-04-12 MED ORDER — EPHEDRINE 5 MG/ML INJ: 10.0000 mg | INTRAVENOUS | Status: DC | PRN

## 2022-04-12 MED ORDER — OXYTOCIN BOLUS FROM INFUSION
333.0000 mL | Freq: Once | INTRAVENOUS | Status: AC
  Administered 2022-04-12: 333 mL via INTRAVENOUS

## 2022-04-12 MED ORDER — OXYTOCIN-SODIUM CHLORIDE 30-0.9 UT/500ML-% IV SOLN
2.5000 [IU]/h | INTRAVENOUS | Status: DC
  Filled 2022-04-12: qty 500

## 2022-04-12 MED ORDER — DIPHENHYDRAMINE HCL 50 MG/ML IJ SOLN: 12.5000 mg | INTRAMUSCULAR | Status: DC | PRN

## 2022-04-12 MED ORDER — MISOPROSTOL 50MCG HALF TABLET
50.0000 ug | ORAL_TABLET | ORAL | Status: DC | PRN
  Administered 2022-04-12: 50 ug via BUCCAL
  Filled 2022-04-12 (×2): qty 1

## 2022-04-12 MED ORDER — LACTATED RINGERS IV SOLN: 500.0000 mL | INTRAVENOUS | Status: DC | PRN

## 2022-04-12 MED ORDER — FLEET ENEMA 7-19 GM/118ML RE ENEM: 1.0000 | ENEMA | RECTAL | Status: DC | PRN

## 2022-04-12 MED ORDER — LACTATED RINGERS IV SOLN
INTRAVENOUS | Status: DC
Start: 2022-04-12 — End: 2022-04-13

## 2022-04-12 MED ORDER — PHENYLEPHRINE 80 MCG/ML (10ML) SYRINGE FOR IV PUSH (FOR BLOOD PRESSURE SUPPORT): 80.0000 ug | PREFILLED_SYRINGE | INTRAVENOUS | Status: DC | PRN

## 2022-04-12 MED ORDER — ACETAMINOPHEN 325 MG PO TABS: 650.0000 mg | ORAL_TABLET | ORAL | Status: DC | PRN

## 2022-04-12 MED ORDER — OXYTOCIN-SODIUM CHLORIDE 30-0.9 UT/500ML-% IV SOLN
1.0000 m[IU]/min | INTRAVENOUS | Status: DC
  Administered 2022-04-12: 2 m[IU]/min via INTRAVENOUS

## 2022-04-12 MED ORDER — LACTATED RINGERS IV SOLN
500.0000 mL | Freq: Once | INTRAVENOUS | Status: AC
  Administered 2022-04-12: 500 mL via INTRAVENOUS

## 2022-04-12 MED ORDER — ONDANSETRON HCL 4 MG/2ML IJ SOLN
4.0000 mg | Freq: Four times a day (QID) | INTRAMUSCULAR | Status: DC | PRN
  Administered 2022-04-12: 4 mg via INTRAVENOUS
  Filled 2022-04-12: qty 2

## 2022-04-12 MED ORDER — FENTANYL-BUPIVACAINE-NACL 0.5-0.125-0.9 MG/250ML-% EP SOLN
12.0000 mL/h | EPIDURAL | Status: DC | PRN
  Administered 2022-04-12: 12 mL/h via EPIDURAL
  Filled 2022-04-12: qty 250

## 2022-04-12 MED ORDER — LIDOCAINE HCL (PF) 1 % IJ SOLN
INTRAMUSCULAR | Status: DC | PRN
  Administered 2022-04-12 (×2): 5 mL via EPIDURAL

## 2022-04-12 MED ORDER — OXYCODONE-ACETAMINOPHEN 5-325 MG PO TABS: 2.0000 | ORAL_TABLET | ORAL | Status: DC | PRN

## 2022-04-12 MED ORDER — TERBUTALINE SULFATE 1 MG/ML IJ SOLN: 0.2500 mg | Freq: Once | INTRAMUSCULAR | Status: DC | PRN

## 2022-04-12 MED ORDER — LACTATED RINGERS IV SOLN: 500.0000 mL | Freq: Once | INTRAVENOUS | Status: DC

## 2022-04-12 MED ORDER — LIDOCAINE HCL (PF) 1 % IJ SOLN: 30.0000 mL | INTRAMUSCULAR | Status: DC | PRN

## 2022-04-12 MED ORDER — OXYCODONE-ACETAMINOPHEN 5-325 MG PO TABS: 1.0000 | ORAL_TABLET | ORAL | Status: DC | PRN

## 2022-04-12 MED ORDER — SOD CITRATE-CITRIC ACID 500-334 MG/5ML PO SOLN
30.0000 mL | ORAL | Status: DC | PRN
Start: 2022-04-12 — End: 2022-04-13

## 2022-04-12 NOTE — Progress Notes (Signed)
LABOR PROGRESS NOTE  Alicia Dunlap is a 31 y.o. G3P2002 at [redacted]w[redacted]d  admitted for postdates induction of labor in the setting of BPP 8/10.  Subjective: Patient doing well.  Epidural effective.  No issues at this time.  Objective: BP 128/76   Pulse 87   Temp 98.4 F (36.9 C)   Resp 16   Ht 5\' 7"  (1.702 m)   Wt 118 kg   LMP 07/01/2021 (Exact Date)   SpO2 99%   BMI 40.75 kg/m  or  Vitals:   04/12/22 1950 04/12/22 1955 04/12/22 2000 04/12/22 2030  BP: 128/77 139/78 (!) 151/105 128/76  Pulse: 77 (!) 125 (!) 150 87  Resp:   18 16  Temp:      SpO2: 99%     Weight:      Height:       Dilation: 5 Effacement (%): 70 Cervical Position: Posterior Station: -1 Presentation: Vertex Exam by:: [email or web address] FHT: baseline rate 130, moderate varibility, + acel, nodecel Toco: difficulty tracing   Labs: Lab Results  Component Value Date   WBC 10.8 (H) 04/12/2022   HGB 13.1 04/12/2022   HCT 37.6 04/12/2022   MCV 83.7 04/12/2022   PLT 285 04/12/2022    Patient Active Problem List   Diagnosis Date Noted   Labor and delivery, indication for care 04/12/2022   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 G3P2002 at [redacted]w[redacted]d here for postdates IOL  Labor: Foley bulb out and patient is comfortable at this time.  Cervical exam 5/75/-1.  After consent AROM with clear fluids.  We will continue to monitor but if continue to have difficulty tracing contractions plan on placing IUPC.  Titrate as able. Fetal Wellbeing: Category 1, reassuring Pain Control: Epidural in place Anticipated MOD: Vaginal delivery  04-19-1997, MD  PGY-3, [facility]  04/12/2022, 9:18 PM

## 2022-04-12 NOTE — Anesthesia Preprocedure Evaluation (Signed)
Anesthesia Evaluation  Patient identified by MRN, date of birth, ID band Patient awake    Reviewed: Allergy & Precautions, H&P , NPO status , Patient's Chart, lab work & pertinent test results  Airway Mallampati: II   Neck ROM: full    Dental   Pulmonary former smoker,    breath sounds clear to auscultation       Cardiovascular negative cardio ROS   Rhythm:regular Rate:Normal     Neuro/Psych    GI/Hepatic (+)     substance abuse  marijuana use,   Endo/Other    Renal/GU      Musculoskeletal   Abdominal   Peds  Hematology   Anesthesia Other Findings   Reproductive/Obstetrics (+) Pregnancy                             Anesthesia Physical Anesthesia Plan  ASA: 2  Anesthesia Plan: Epidural   Post-op Pain Management:    Induction: Intravenous  PONV Risk Score and Plan: 2 and Treatment may vary due to age or medical condition  Airway Management Planned: Natural Airway  Additional Equipment:   Intra-op Plan:   Post-operative Plan:   Informed Consent: I have reviewed the patients History and Physical, chart, labs and discussed the procedure including the risks, benefits and alternatives for the proposed anesthesia with the patient or authorized representative who has indicated his/her understanding and acceptance.       Plan Discussed with: Anesthesiologist  Anesthesia Plan Comments:         Anesthesia Quick Evaluation

## 2022-04-12 NOTE — H&P (Signed)
LABOR AND DELIVERY ADMISSION HISTORY AND PHYSICAL NOTE  Alicia Dunlap is a 31 y.o. female G84P2002 with IUP at [redacted]w[redacted]d by 6 week [place] presenting for a post-dates IOL in the setting of a BPP 8/10 today with -2 due to absent fetal movement.  She reports positive fetal movement but has felt less than usual. She denies leakage of fluid or vaginal bleeding.  Prenatal History/Complications: PNC at [facility] Pregnancy complications:  - HSV without any recent lesions. Taking valtrex since 36 weeks. - Sickle cell trait, no partner testing     Past Medical History: Past Medical History:  Diagnosis Date   Bacterial vaginosis    DENTAL CARIES 08/11/2010   Qualifier: Diagnosis of  By: [place] MD, Sarah     Drug abuse, marijuana 09/06/2012   High risk sexual behavior 02/12/2013   Multiple STD's checks: 8 since 2011.    Tobacco abuse 06/11/2017    Past Surgical History: Past Surgical History:  Procedure Laterality Date   NO PAST SURGERIES      Obstetrical History: OB History     Gravida  3   Para  2   Term  2   Preterm      AB      Living  2      SAB      IAB      Ectopic      Multiple  0   Live Births  2           Social History: Social History   Socioeconomic History   Marital status: Married    Spouse name: Not on file   Number of children: Not on file   Years of education: Not on file   Highest education level: Not on file  Occupational History   Not on file  Tobacco Use   Smoking status: Former    Types: Cigarettes   Smokeless tobacco: Never  Vaping Use   Vaping Use: Former   Quit date: 05/25/2021  Substance and Sexual Activity   Alcohol use: Not Currently   Drug use: Not Currently   Sexual activity: Yes    Birth control/protection: None  Other Topics Concern   Not on file  Social History Narrative   ** Merged History Encounter **       Social Determinants of Health   Financial Resource Strain: Not on file  Food Insecurity: Not on file   Transportation Needs: Not on file  Physical Activity: Not on file  Stress: Not on file  Social Connections: Not on file    Family History: Family History  Problem Relation Age of Onset   Hypertension Maternal Grandmother    Diabetes Neg Hx    Heart disease Neg Hx    Obesity Neg Hx    Stroke Neg Hx     Allergies: Allergies  Allergen Reactions   Amoxicillin Rash    Has patient had a PCN reaction causing immediate rash, facial/tongue/throat swelling, SOB or lightheadedness with hypotension: no Has patient had a PCN reaction causing severe rash involving mucus membranes or skin necrosis: no Has patient had a PCN reaction that required hospitalization no Has patient had a PCN reaction occurring within the last 10 years: yes If all of the above answers are "NO", then may proceed with Cephalosporin use.     Medications Prior to Admission  Medication Sig Dispense Refill Last Dose   aspirin EC 81 MG tablet Take 1 tablet (81 mg total) by mouth daily. Swallow whole.  30 tablet 11 04/11/2022   Prenatal Vit-Fe Fumarate-FA (PRENATAL MULTIVITAMIN) TABS tablet Take 1 tablet by mouth daily at 12 noon. 90 tablet 2 04/11/2022   valACYclovir (VALTREX) 500 MG tablet Take 1 tablet (500 mg total) by mouth 2 (two) times daily. Begin 03/11/22 70 tablet 0 04/11/2022   acetaminophen (TYLENOL) 500 MG tablet Take 2 tablets (1,000 mg total) by mouth every 6 (six) hours as needed. 30 tablet 3    loratadine (CLARITIN) 10 MG tablet Take 1 tablet (10 mg total) by mouth daily. (Patient not taking: Reported on 04/12/2022) 30 tablet 11    triamcinolone ointment (KENALOG) 0.5 % Apply 1 application on to the skin 2 times daily for moderate to severe eczema. Do not use for more than 1 week at a time. 60 g 3      Review of Systems  All systems reviewed and negative except as stated in HPI  Physical Exam Blood pressure 137/86, pulse 96, temperature 98.4 F (36.9 C), height 5\' 7"  (1.702 m), weight 118 kg, last  menstrual period 07/01/2021, currently breastfeeding. General appearance: alert, oriented, NAD Lungs: normal respiratory effort Heart: regular rate Abdomen: soft, non-tender; gravid, FH appropriate for GA Extremities: No calf swelling or tenderness Pelvic: NEFG without lesions seen, speculum exam with normal appearing vulva, vaginal wall, and cervix.  Presentation: cephalic Fetal monitoring: 130/mod/15x15/none Uterine activity: irregular  Dilation: 1 Effacement (%): Thick Station: -2 Exam by:: Deloria Brassfield  Prenatal labs: ABO, Rh: --/--/PENDING (05/31 1249) Antibody: PENDING (05/31 1249) Rubella: 1.00 (10/03 1544) RPR: Non Reactive (03/16 1622)  HBsAg: Negative (10/03 1544)  HIV: Non Reactive (03/16 1622)  GC/Chlamydia: negative  GBS: Negative/-- (05/02 1542)  1-hr GTT: passed Genetic screening:  LR NIPS Anatomy US: normal   Prenatal Transfer Tool  Maternal Diabetes: No Genetic Screening: Normal Maternal Ultrasounds/Referrals: Normal Fetal Ultrasounds or other Referrals:  None Maternal Substance Abuse:  No Significant Maternal Medications:  None Significant Maternal Lab Results: Group B Strep negative  Results for orders placed or performed during the hospital encounter of 04/12/22 (from the past 24 hour(s))  CBC   Collection Time: 04/12/22 12:49 PM  Result Value Ref Range   WBC 10.8 (H) 4.0 - 10.5 K/uL   RBC 4.49 3.87 - 5.11 MIL/uL   Hemoglobin 13.1 12.0 - 15.0 g/dL   HCT 40.937.6 81.136.0 - 91.446.0 %   MCV 83.7 80.0 - 100.0 fL   MCH 29.2 26.0 - 34.0 pg   MCHC 34.8 30.0 - 36.0 g/dL   RDW 78.213.0 95.611.5 - 21.315.5 %   Platelets 285 150 - 400 K/uL   nRBC 0.0 0.0 - 0.2 %  Type and screen [facility]   Collection Time: 04/12/22 12:49 PM  Result Value Ref Range   ABO/RH(D) PENDING    Antibody Screen PENDING    Sample Expiration      04/15/2022,2359 Performed at [facility], 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 G3P2002 at 7536w4d here for a post-dates IOL.   #Labor:  After verbal consent, placed a FB without difficulty. Patient tolerated well. Buccal cytotec also placed at that time.  #Pain: PRN  #FWB: Cat I  #ID: GBS Negative  #MOF: Breast and formula  #MOC: Undecided, aware of options. Previously had a bad experience with the nexplanon (AUB).  #Circ: NA, female   #HSV: No recent lesions and on prophylactic valtrex. Speculum exam performed without lesions present.   Allayne Stack 04/12/2022, 1:36 PM

## 2022-04-12 NOTE — Anesthesia Procedure Notes (Signed)
Epidural Patient location during procedure: OB Start time: 04/12/2022 7:23 PM End time: 04/12/2022 7:33 PM  Staffing Anesthesiologist: Achille Rich, MD Performed: anesthesiologist   Preanesthetic Checklist Completed: patient identified, IV checked, site marked, risks and benefits discussed, monitors and equipment checked, pre-op evaluation and timeout performed  Epidural Patient position: sitting Prep: DuraPrep Patient monitoring: heart rate, cardiac monitor, continuous pulse ox and blood pressure Approach: midline Location: L2-L3 Injection technique: LOR saline  Needle:  Needle type: Tuohy  Needle gauge: 17 G Needle length: 9 cm Needle insertion depth: 7 cm Catheter type: closed end flexible Catheter size: 19 Gauge Catheter at skin depth: 12 cm Test dose: negative and Other  Assessment Events: blood not aspirated, injection not painful, no injection resistance and negative IV test  Additional Notes Informed consent obtained prior to proceeding including risk of failure, 1% risk of PDPH, risk of minor discomfort and bruising.  Discussed rare but serious complications including epidural abscess, permanent nerve injury, epidural hematoma.  Discussed alternatives to epidural analgesia and patient desires to proceed.  Timeout performed pre-procedure verifying patient name, procedure, and platelet count.  Patient tolerated procedure well. Reason for block:procedure for pain

## 2022-04-12 NOTE — Discharge Summary (Signed)
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 yo G3P2002 at 457w4das admitted to the hospital 04/12/2022 for induction of labor.  Indication for induction: Postdates and BPP 8/10.  Patient had an uncomplicated labor course and vaginal delivery.  Membrane Rupture Time/Date: 9:08 PM ,04/12/2022   Delivery Method:Vaginal, Spontaneous  Episiotomy: None  Lacerations:  1st degree  Details of delivery can be found in separate delivery note.  Patient had a routine postpartum course.  She is eating, drinking, voiding, and ambulating without issue.  Her pain and bleeding are controlled.  Patient is discharged home 04/14/22.  Newborn Data: Birth date:04/12/2022  Birth time:11:25 PM  Gender:Female  Living status:Living  Apgars:9 ,9  Weight:3190 g   Magnesium Sulfate received: No BMZ received: No Rhophylac: N/A MMR: N/A - Immune  T-DaP: Given prenatally Flu: No Transfusion: No  Physical exam  Vitals:   04/13/22 1035 04/13/22 1435 04/13/22 2100 04/14/22 0624  BP: 112/69 120/67 103/62 115/73  Pulse: 95 86 90 83  Resp: 18 18 18    Temp: 98 F (36.7 C) 98.1 F (36.7 C) 98 F (36.7 C) 98.5 F (36.9  C)  TempSrc: Oral Oral Oral Oral  SpO2: 95% 98% 98% 98%  Weight:      Height:       General: alert, cooperative, and no distress Lochia: appropriate Uterine Fundus: firm Incision: N/A DVT Evaluation: No evidence of DVT seen on physical exam. Negative Homan's sign. No cords or calf tenderness. No significant calf/ankle edema.  Labs: Lab Results  Component Value Date   WBC 10.8 (H) 04/12/2022   HGB 13.1 04/12/2022   HCT 37.6 04/12/2022   MCV 83.7 04/12/2022   PLT 285 04/12/2022      Latest Ref Rng & Units 09/08/2021    4:37 AM  CMP  Glucose 70 - 99 mg/dL 100    BUN 6 - 20 mg/dL <5    Creatinine 0.44 - 1.00 mg/dL 0.57    Sodium 135 - 145 mmol/L 133    Potassium 3.5 - 5.1 mmol/L 3.6    Chloride 98 - 111 mmol/L 103    CO2 22 - 32 mmol/L 22    Calcium 8.9 - 10.3 mg/dL 9.0     Edinburgh Score:    04/13/2022    2:35 PM  Edinburgh Postnatal Depression Scale Screening Tool  I have been able to laugh and see the funny side of things. 0  I have looked forward with enjoyment to things. 0  I have blamed myself unnecessarily when things went  wrong. 0  I have been anxious or worried for no good reason. 0  I have felt scared or panicky for no good reason. 0  Things have been getting on top of me. 0  I have been so unhappy that I have had difficulty sleeping. 0  I have felt sad or miserable. 0  I have been so unhappy that I have been crying. 0  The thought of harming myself has occurred to me. 0  Edinburgh Postnatal Depression Scale Total 0     After visit meds:  Allergies as of 04/14/2022       Reactions   Amoxicillin Rash   Has patient had a PCN reaction causing immediate rash, facial/tongue/throat swelling, SOB or lightheadedness with hypotension: no Has patient had a PCN reaction causing severe rash involving mucus membranes or skin necrosis: no Has patient had a PCN reaction that required hospitalization no Has patient had a PCN reaction occurring within the last 10  years: yes If all of the above answers are "NO", then may proceed with Cephalosporin use.        Medication List     STOP taking these medications    acetaminophen 500 MG tablet Commonly known as: TYLENOL   aspirin EC 81 MG tablet   loratadine 10 MG tablet Commonly known as: CLARITIN   prenatal multivitamin Tabs tablet   triamcinolone ointment 0.5 % Commonly known as: KENALOG   valACYclovir 500 MG tablet Commonly known as: Valtrex       TAKE these medications    ibuprofen 600 MG tablet Commonly known as: ADVIL Take 1 tablet (600 mg total) by mouth every 6 (six) hours.         Discharge home in stable condition Infant Feeding: Bottle and Breast Infant Disposition: home with mother Discharge instruction: per After Visit Summary and Postpartum booklet. Activity: Advance as tolerated. Pelvic rest for 6 weeks.  Diet: routine diet Future Appointments: Future Appointments  Date Time Provider [place]  05/25/2022  9:20 AM Erskine Emery, MD [facility] [place]   Follow up Visit: Message sent to [facility] by Dr. Gwenlyn Perking on 04/12/22.   Please schedule this patient for a In person postpartum visit in 6 weeks with the following provider: Any provider Caron Presume, MD if possible). Additional Postpartum F/U:  None   Low risk pregnancy complicated by: None  Delivery mode:  Vaginal, Spontaneous  Anticipated Birth Control:  Unsure  04/14/2022 Christin Fudge, CNM

## 2022-04-12 NOTE — Procedures (Signed)
Alicia Dunlap 1991/02/14 [redacted]w[redacted]d  Fetus A Non-Stress Test Interpretation for 04/12/22  Indication: Unsatisfactory BPP  Fetal Heart Rate A Mode: External Baseline Rate (A): 130 bpm Variability: Moderate Accelerations: 15 x 15 Decelerations: None Multiple birth?: No  Uterine Activity Mode: Toco Contraction Frequency (min): 3.5-7 Contraction Duration (sec): 80-90 Contraction Quality: Mild Resting Tone Palpated: Relaxed  Interpretation (Fetal Testing) Nonstress Test Interpretation: Reactive Overall Impression: Reassuring for gestational age Comments: tracing reviewed by Dr. Annamaria Boots

## 2022-04-12 NOTE — Consult Note (Deleted)
MFM Note  Alicia Dunlap was seen for postdates testing at 40 weeks and 4 days.   She has noted decreased fetal movements over the past few days.   She denies any other problems in her current pregnancy.    A biophysical profile performed today was 8 out of 10 with a reactive NST.  She received a -2 off her BPP score due to absent fetal movements.  There was normal amniotic fluid noted on today's ultrasound exam, with a total AFI of 17.2 cm.  The fetus was in the vertex presentation today.  Due to complaints of decreased fetal movements and as very little fetal movements were noted on today's exam, at her current gestational age, delivery is recommended.    The patient was sent to labor and delivery following today's exam for delivery.    The patient is happy and excited for delivery today.  She was given a note to be off from work for her delivery in the postpartum period.  A total of 20 minutes was spent counseling and coordinating the care for this patient.  Greater than 50% of the time was spent in direct face-to-face contact.

## 2022-04-12 NOTE — Progress Notes (Signed)
Post Partum Day 1  Subjective: Doing well. No acute events overnight. Pain is controlled and bleeding is appropriate. She is eating, drinking, voiding, and ambulating without issue. She is breast and formula*** feeding which is going well. She has no other concerns at this time.  Objective: Blood pressure 139/72, pulse 95, temperature 98.4 F (36.9 C), resp. rate 16, height 5\' 7"  (1.702 m), weight 118 kg, last menstrual period 07/01/2021, SpO2 99 %, currently breastfeeding.  Physical Exam:  General: {Exam; general:16600} Lochia: {Desc; appropriate/inappropriate:30686} Uterine Fundus: {Desc; firm/soft:30687} Incision: {Exam; incision:13523} DVT Evaluation: {Exam; dvt:13533}  Recent Labs    04/12/22 1249  HGB 13.1  HCT 37.6    Assessment/Plan: Alicia Dunlap is a 31 y.o. G3P3003 on PPD# 1 s/p SVD.  Progressing well. Meeting postpartum milestones. VSS. Continue routine postpartum care.  Feeding: Breast and formula  Contraception: Unsure, counseled at bedside this AM  Dispo: Plan for discharge on PPD#2.    LOS: 0 days   37, MD  04/12/2022, 11:56 PM

## 2022-04-12 NOTE — Progress Notes (Signed)
MFM Note  Alicia Dunlap was seen for postdates testing at 40 weeks and 4 days.   She has noted decreased fetal movements over the past few days.   She denies any other problems in her current pregnancy.    A biophysical profile performed today was 8 out of 10 with a reactive NST.  She received a -2 off her BPP score due to absent fetal movements.  There was normal amniotic fluid noted on today's ultrasound exam, with a total AFI of 17.2 cm.  The fetus was in the vertex presentation today.  Due to complaints of decreased fetal movements and as very little fetal movements were noted on today's exam, at her current gestational age, delivery is recommended.    The patient was sent to labor and delivery following today's exam for delivery.    The patient is happy and excited for delivery today.  She was given a note to be off from work for her delivery in the postpartum period.  A total of 20 minutes was spent counseling and coordinating the care for this patient.  Greater than 50% of the time was spent in direct face-to-face contact. 

## 2022-04-13 ENCOUNTER — Encounter (HOSPITAL_COMMUNITY): Payer: Self-pay | Admitting: Family Medicine

## 2022-04-13 DIAGNOSIS — Z419 Encounter for procedure for purposes other than remedying health state, unspecified: Secondary | ICD-10-CM | POA: Diagnosis not present

## 2022-04-13 LAB — RPR: RPR Ser Ql: NONREACTIVE

## 2022-04-13 MED ORDER — DIPHENHYDRAMINE HCL 25 MG PO CAPS: 25.0000 mg | ORAL_CAPSULE | Freq: Four times a day (QID) | ORAL | Status: DC | PRN

## 2022-04-13 MED ORDER — COCONUT OIL OIL: 1.0000 "application " | TOPICAL_OIL | Status: DC | PRN

## 2022-04-13 MED ORDER — ONDANSETRON HCL 4 MG/2ML IJ SOLN
4.0000 mg | INTRAMUSCULAR | Status: DC | PRN
  Filled 2022-04-13: qty 2

## 2022-04-13 MED ORDER — WITCH HAZEL-GLYCERIN EX PADS: 1.0000 "application " | MEDICATED_PAD | CUTANEOUS | Status: DC | PRN

## 2022-04-13 MED ORDER — ONDANSETRON HCL 4 MG PO TABS: 4.0000 mg | ORAL_TABLET | ORAL | Status: DC | PRN

## 2022-04-13 MED ORDER — SENNOSIDES-DOCUSATE SODIUM 8.6-50 MG PO TABS
2.0000 | ORAL_TABLET | Freq: Every day | ORAL | Status: DC
  Administered 2022-04-13 – 2022-04-14 (×2): 2 via ORAL
  Filled 2022-04-13 (×2): qty 2

## 2022-04-13 MED ORDER — IBUPROFEN 600 MG PO TABS
600.0000 mg | ORAL_TABLET | Freq: Four times a day (QID) | ORAL | Status: DC
Start: 2022-04-13 — End: 2022-04-14
  Administered 2022-04-13 – 2022-04-14 (×6): 600 mg via ORAL
  Filled 2022-04-13 (×6): qty 1

## 2022-04-13 MED ORDER — SIMETHICONE 80 MG PO CHEW: 80.0000 mg | CHEWABLE_TABLET | ORAL | Status: DC | PRN

## 2022-04-13 MED ORDER — ACETAMINOPHEN 325 MG PO TABS
650.0000 mg | ORAL_TABLET | ORAL | Status: DC | PRN
  Administered 2022-04-13 – 2022-04-14 (×4): 650 mg via ORAL
  Filled 2022-04-13 (×4): qty 2

## 2022-04-13 MED ORDER — DIBUCAINE (PERIANAL) 1 % EX OINT: 1.0000 "application " | TOPICAL_OINTMENT | CUTANEOUS | Status: DC | PRN

## 2022-04-13 MED ORDER — BENZOCAINE-MENTHOL 20-0.5 % EX AERO
1.0000 "application " | INHALATION_SPRAY | CUTANEOUS | Status: DC | PRN
  Administered 2022-04-13: 1 via TOPICAL
  Filled 2022-04-13: qty 56

## 2022-04-13 MED ORDER — PRENATAL MULTIVITAMIN CH
1.0000 | ORAL_TABLET | Freq: Every day | ORAL | Status: DC
Start: 2022-04-13 — End: 2022-04-14
  Administered 2022-04-13 – 2022-04-14 (×2): 1 via ORAL
  Filled 2022-04-13 (×2): qty 1

## 2022-04-13 NOTE — Anesthesia Postprocedure Evaluation (Signed)
Anesthesia Post Note  Patient: Alicia Dunlap  Procedure(s) Performed: AN AD [place]     Patient location during evaluation: Mother Baby Anesthesia Type: Epidural Level of consciousness: awake and alert Pain management: pain level controlled Vital Signs Assessment: post-procedure vital signs reviewed and stable Respiratory status: spontaneous breathing, nonlabored ventilation and respiratory function stable Cardiovascular status: stable Postop Assessment: no headache, no backache and epidural receding Anesthetic complications: no   No notable events documented.  Last Vitals:  Vitals:   04/13/22 0203 04/13/22 0553  BP: 123/70 114/73  Pulse: 93 85  Resp: 18 18  Temp: 37.2 C 36.9 C  SpO2: 99% 95%    Last Pain:  Vitals:   04/13/22 0553  TempSrc: Oral  PainSc: 7    Pain Goal:                   [name]

## 2022-04-13 NOTE — Lactation Note (Signed)
This note was copied from a baby's chart. Lactation Consultation Note  Patient Name: Girl Antoria Lanza Today's Date: 04/13/2022 Reason for consult: L&D Initial assessment Age:31 years P3, term female infant. Mom's feeding choice is: breast and formula feeding. Per mom, it took her 2nd child awhile to receive bottle, she was working from home during  COVID, whereas, with third infant she  plans to return to work at 12 weeks and want to breast and formula feeding while in hospital..  LC entered the room, infant was cuing to breastfeed.  Mom latched infant, on her left breast using the cradle hold position, infant latched with depth, swallows observed, infant breastfeed for 12 minutes. Mom will attempt to latch infant on both breast during a feeding, mom will continue to latch infant on demand, by cues, skin to skin. Mom is requesting a hand pump on MBU. Mom knows to call RN/LC on MBU if she has questions, concerns or would further latch assistance.  LC congratulated parents on the birth of their daughter.   Maternal Data Does the patient have breastfeeding experience prior to this delivery?: Yes How long did the patient breastfeed?: Per mom, she BF 1st child for 1 month, 2nd child for 2 years who is now almost 3 years.  Feeding Mother's Current Feeding Choice: Breast Milk and Formula  LATCH Score Latch: Grasps breast easily, tongue down, lips flanged, rhythmical sucking.  Audible Swallowing: Spontaneous and intermittent  Type of Nipple: Everted at rest and after stimulation  Comfort (Breast/Nipple): Soft / non-tender  Hold (Positioning): Assistance needed to correctly position infant at breast and maintain latch.  LATCH Score: 9   Lactation Tools Discussed/Used    Interventions Interventions: Assisted with latch;Skin to skin;Breast compression;Adjust position;Support pillows;Position options;Education  Discharge Pump: Personal;Hands Free (Per mom, she has hands free personal  pump.) WIC Program: Yes (Mom mention to [facility] in L&D that she participates in the [facility] program.)  Consult Status Consult Status: Follow-up from L&D    Danelle Earthly 04/13/2022, 12:11 AM

## 2022-04-14 MED ORDER — IBUPROFEN 600 MG PO TABS
600.0000 mg | ORAL_TABLET | Freq: Four times a day (QID) | ORAL | 0 refills | Status: DC
Start: 2022-04-14 — End: 2022-06-08

## 2022-04-15 ENCOUNTER — Inpatient Hospital Stay (HOSPITAL_COMMUNITY): Payer: Medicaid Other

## 2022-04-15 ENCOUNTER — Inpatient Hospital Stay (HOSPITAL_COMMUNITY)
Admission: AD | Admit: 2022-04-15 | Payer: Medicaid Other | Source: Home / Self Care | Admitting: Obstetrics and Gynecology

## 2022-04-18 ENCOUNTER — Encounter: Payer: Self-pay | Admitting: *Deleted

## 2022-04-22 ENCOUNTER — Telehealth (HOSPITAL_COMMUNITY): Payer: Self-pay | Admitting: *Deleted

## 2022-04-22 NOTE — Telephone Encounter (Signed)
Attempted Hospital Discharge Follow-Up Call.  Left voice mail requesting that patient return RN's phone call.  

## 2022-04-28 ENCOUNTER — Telehealth: Payer: Self-pay

## 2022-04-28 NOTE — Telephone Encounter (Signed)
Received phone call from [place], family connects RN regarding patient having elevated Edinburg. Edinburg score of 14 on 04/26/22. Denies SI. Referral was placed for [facility].   Patient has a follow up visit with PCP on 6/22. Will forward to PCP.   Veronda Prude, RN

## 2022-05-04 ENCOUNTER — Encounter: Payer: Self-pay | Admitting: Family Medicine

## 2022-05-04 ENCOUNTER — Ambulatory Visit (INDEPENDENT_AMBULATORY_CARE_PROVIDER_SITE_OTHER): Payer: Medicaid Other | Admitting: Family Medicine

## 2022-05-04 VITALS — BP 117/91 | HR 96 | Ht 67.0 in | Wt 248.0 lb

## 2022-05-04 DIAGNOSIS — F53 Postpartum depression: Secondary | ICD-10-CM

## 2022-05-04 DIAGNOSIS — Z3009 Encounter for other general counseling and advice on contraception: Secondary | ICD-10-CM | POA: Diagnosis not present

## 2022-05-04 MED ORDER — SLYND 4 MG PO TABS: 4.0000 mg | ORAL_TABLET | Freq: Every day | ORAL | 11 refills | Status: DC

## 2022-05-04 MED ORDER — SERTRALINE HCL 25 MG PO TABS: 25.0000 mg | ORAL_TABLET | Freq: Every day | ORAL | 3 refills | Status: DC

## 2022-05-04 NOTE — Patient Instructions (Signed)
It was great seeing you today!  I am sorry you are going through this rough time.  Wanted to start you on a medication called sertraline (Zoloft) which can help with depression and anxiety.  Regarding birth control and start you on a medication called Slynd.  This is a progesterone only birth control so you can take it while breast-feeding.  If your insurance does not cover it please let me know and I will send in the other form of progesterone only birth control.  Please be sure to take this at the same time every day.  Regarding your tear it seems to be healing well and I have no concerns at this time.  Please remain sexually inactive for a few more weeks.  If you have any questions or concerns call the clinic.  I will see you in 1 week.  I hope you have a great rest of your day!

## 2022-05-04 NOTE — Progress Notes (Unsigned)
    SUBJECTIVE:   CHIEF COMPLAINT / HPI:   Contraceptive counseling Patient presents today to discuss birth control.  She recently had a baby date of delivery of 04/12/2022.  She has not yet been seen for her postpartum visit.  PERTINENT  PMH / PSH: ***  OBJECTIVE:   Ht 5\' 7"  (1.702 m)   Wt 248 lb (112.5 kg)   LMP 07/01/2021 (Exact Date)   BMI 38.84 kg/m   ***  ASSESSMENT/PLAN:   No problem-specific Assessment & Plan notes found for this encounter.     07/03/2021, MD [facility]

## 2022-05-05 DIAGNOSIS — F53 Postpartum depression: Secondary | ICD-10-CM | POA: Insufficient documentation

## 2022-05-05 NOTE — Assessment & Plan Note (Signed)
Patient with concerns for postpartum depression.  She is being set up with a Veterinary surgeon.  Denies any SI or HI.  Feels like she can take good care of her new baby but is just having a lot of difficulty with major life events.  Is interested in medications.  We will start Zoloft 25 mg daily.  Plan to follow-up with the patient in 1 week.

## 2022-05-11 ENCOUNTER — Encounter: Payer: Self-pay | Admitting: Family Medicine

## 2022-05-11 ENCOUNTER — Ambulatory Visit (INDEPENDENT_AMBULATORY_CARE_PROVIDER_SITE_OTHER): Payer: Medicaid Other | Admitting: Family Medicine

## 2022-05-11 DIAGNOSIS — F53 Postpartum depression: Secondary | ICD-10-CM | POA: Diagnosis not present

## 2022-05-11 DIAGNOSIS — K649 Unspecified hemorrhoids: Secondary | ICD-10-CM

## 2022-05-11 NOTE — Progress Notes (Signed)
    SUBJECTIVE:   CHIEF COMPLAINT / HPI:   Postpartum depression follow-up Patient reports that she has been doing well since the last visit.  All of her depression symptoms are stable but since starting the new medication she does report she feels a little bit more "on edge" than she was before.  Continues to report that she is able to take care of her child well and denies any SI or HI.  States that she has not received a call from baby love nurse regarding counseling appointment for her or her daughter.  Is interested in seeing therapy.   OBJECTIVE:   BP 108/77   Pulse 88   Wt 244 lb (110.7 kg)   Breastfeeding Yes   BMI 38.22 kg/m   General: Pleasant 31 year old female Cardiac: Regular rate Respiratory: Normal work of breathing, speaking full sentences Psych: Appears intermittently down, denies any SI or HI.  Does report considerable pression.  Edinburg at last visit was 19, stable at this visit     05/11/2022    9:33 AM 04/13/2022    2:35 PM 09/29/2019   11:10 AM 09/12/2019    8:56 AM  Edinburgh Postnatal Depression Scale Screening Tool  I have been able to laugh and see the funny side of things. 2 0 0 0  I have looked forward with enjoyment to things. 1 0 0 0  I have blamed myself unnecessarily when things went wrong. 2 0 0 0  I have been anxious or worried for no good reason. 2 0 0 0  I have felt scared or panicky for no good reason. 2 0 0 0  Things have been getting on top of me. 3 0 0 0  I have been so unhappy that I have had difficulty sleeping. 2 0 0 0  I have felt sad or miserable. 3 0 0 0  I have been so unhappy that I have been crying. 2 0 0 0  The thought of harming myself has occurred to me. 0 0 0 0  Edinburgh Postnatal Depression Scale Total 19 0 0 0      ASSESSMENT/PLAN:   Hemorrhoids While patient was walking on the room she did report that she had some blood in her stool on 1 occurrence last week.  She was straining considerably hard.  History of  hemorrhoids.  Recommended continued use of stool softener and return for reevaluation if returns or worsens.  Postpartum depression Patient continues to experience postpartum depression.  No improvement with the medication.  Denies any SI or HI at this time.  Would like to see counseling spoke with Dr. Shawnee Knapp has scheduled patient for an appointment in 2 weeks.  Continue Zoloft at this time and plan for follow-up in 1 to 2 weeks provider.  Strict ED precautions given.     Celedonio Savage, MD [facility]

## 2022-05-11 NOTE — Assessment & Plan Note (Signed)
Patient continues to experience postpartum depression.  No improvement with the medication.  Denies any SI or HI at this time.  Would like to see counseling spoke with Dr. Shawnee Knapp has scheduled patient for an appointment in 2 weeks.  Continue Zoloft at this time and plan for follow-up in 1 to 2 weeks provider.  Strict ED precautions given.

## 2022-05-11 NOTE — Patient Instructions (Signed)
It was great seeing you today.  I am sorry you are still going through this rough patch.  Your new doctor will be Dr. Hyacinth Meeker.  I want you to continue your Zoloft and I want you to schedule a follow-up visit with her in 1 to 2 weeks just to check-in.  If you need anything please call the clinic.  I really enjoyed taking care of you and it was a pleasure to be able to deliver your baby!

## 2022-05-11 NOTE — Assessment & Plan Note (Signed)
While patient was walking on the room she did report that she had some blood in her stool on 1 occurrence last week.  She was straining considerably hard.  History of hemorrhoids.  Recommended continued use of stool softener and return for reevaluation if returns or worsens.

## 2022-05-13 DIAGNOSIS — Z419 Encounter for procedure for purposes other than remedying health state, unspecified: Secondary | ICD-10-CM | POA: Diagnosis not present

## 2022-05-25 ENCOUNTER — Encounter: Payer: Self-pay | Admitting: Student

## 2022-05-25 ENCOUNTER — Telehealth: Payer: Self-pay | Admitting: Psychology

## 2022-05-25 ENCOUNTER — Ambulatory Visit (INDEPENDENT_AMBULATORY_CARE_PROVIDER_SITE_OTHER): Payer: Medicaid Other | Admitting: Psychology

## 2022-05-25 ENCOUNTER — Other Ambulatory Visit (HOSPITAL_COMMUNITY)
Admission: RE | Admit: 2022-05-25 | Discharge: 2022-05-25 | Disposition: A | Discharge status: Home / Self Care | Payer: Medicaid Other | Source: Ambulatory Visit | Attending: Family Medicine | Admitting: Family Medicine

## 2022-05-25 ENCOUNTER — Ambulatory Visit (INDEPENDENT_AMBULATORY_CARE_PROVIDER_SITE_OTHER): Payer: Medicaid Other | Admitting: Student

## 2022-05-25 VITALS — BP 117/78 | HR 91 | Ht 67.0 in | Wt 250.0 lb

## 2022-05-25 DIAGNOSIS — F419 Anxiety disorder, unspecified: Secondary | ICD-10-CM

## 2022-05-25 DIAGNOSIS — F53 Postpartum depression: Secondary | ICD-10-CM | POA: Diagnosis not present

## 2022-05-25 DIAGNOSIS — N898 Other specified noninflammatory disorders of vagina: Secondary | ICD-10-CM

## 2022-05-25 DIAGNOSIS — K649 Unspecified hemorrhoids: Secondary | ICD-10-CM

## 2022-05-25 LAB — POCT WET PREP (WET MOUNT)
Clue Cells Wet Prep Whiff POC: NEGATIVE
Trichomonas Wet Prep HPF POC: ABSENT
WBC, Wet Prep HPF POC: NONE SEEN

## 2022-05-25 LAB — POCT URINE PREGNANCY: Preg Test, Ur: NEGATIVE

## 2022-05-25 MED ORDER — SERTRALINE HCL 50 MG PO TABS: 50.0000 mg | ORAL_TABLET | Freq: Every day | ORAL | 0 refills | Status: DC

## 2022-05-25 MED ORDER — POLYETHYLENE GLYCOL 3350 17 GM/SCOOP PO POWD: 17.0000 g | Freq: Two times a day (BID) | ORAL | 1 refills | Status: DC | PRN

## 2022-05-25 NOTE — Patient Instructions (Signed)
Therapy and Counseling Resources Most providers on this list will take Medicaid. Patients with commercial insurance or Medicare should contact their insurance company to get a list of in network providers.  [facility] (spanish speaking therapist available)(habla espanol)(take medicare and medicaid)  [address], [place], [place] [address], [place] [email or web address] [phone]  [facility] [address] [place], [place] [address] [phone]  [facility]   [address]   [place], [place] [address]      [phone]  [facility] (spanish available) [address]  [place], [place] [address] [phone]  [facility] (take medicaid and medicare) [address]  [place], [place] [address]       [phone]     [facility] (virtual only) [phone]  [facility] [address], [place], [place] [phone]  [facility]:  [address] [place] [phone]  [facility]:  [address], [place] [phone]  [facility] (under & uninsured) [address]   [place] [phone]    [email or web address]    [facility] [address]  [place]    [phone]  [facility] [place] -[address]     Phone:  [phone]     [place]-  [address]  [phone]   [facility] [address]      [place], [place] [phone]  [facility]: [address], [place], [place]  [phone]   [facility] (Spanish therapist) [email or web address]  [address]   [place] [address]    [phone]    [facility]   [address],  [place], [place]  [phone]   [facility]  [address]  [place]  [phone]  [facility]  [address] [place], [place]        [phone]  Open Access/Walk In Clinic under & uninsured  [facility]  [address] [place], [place] [phone] Crisis [phone]  [facility] [place],  (Spanish)   [address], [place]: ([phone]) 8:30 - 12; 1 - 2:30  [facility],  [address], [place]    ([phone]):8:30 - 12; 2 - 3PM  [facility] [place],  [address],  [place]; ([phone]):   Mon - Fri 8 AM - 5 PM  [facility] [address] [place]  MWF 12:30 to 3:00 or call to schedule an appointment  [phone]  Specific Provider options Psychology Today  https://www.psychologytoday.com/us click on find a therapist  enter your zip code left side and select or tailor a therapist for your specific need.   [facility] Provider Directory [email or web address]  (Medicaid)   Follow all drop down to find a provider  Social Support program [facility] [phone] or www.mhag.org [address], [place],  Recovery support and educational   24- Hour Availability:   [facility]  [address] ,  [place] [phone] Crisis [phone]  [facility]  Crisis Line [phone]  [facility]  [phone]   [facility] [place] [facility]  [phone] (after hours)  Therapeutic Alternative/Mobile Crisis   1-877-626-1772  [place] National Suicide   Hotline  1-800-273-8255 (TALK)  Call 911 or go to emergency room  [facility]  (800-256-2452);  [place] and [place]   [facility] ACCESS  ([phone]); [place], [place], [place], [place], [place], [place], [place]  

## 2022-05-25 NOTE — Telephone Encounter (Signed)
Spoke with pt. Encouraged her to make appt with her PCP regarding past heart palpitations and arm tingling.    She asked to tell Dr. Jena Gauss if she could fax over today's note along with the paperwork she filled out.    Royetta Asal, PhD., LMFT

## 2022-05-25 NOTE — Telephone Encounter (Signed)
Patient scheduled for 7/27 per request as she reports a BH visit with Shawnee Knapp at 230pm that day.

## 2022-05-25 NOTE — Assessment & Plan Note (Signed)
Reviewed labs and allergies, will check HIV, RPR, GC/Chlamydia, wet prep and will call patient with results. Discussed safe sex practices. Will schedule pap further out from delivery of infant (6 weeks postpartum). Patient's questions answered to their satisfaction.

## 2022-05-25 NOTE — Progress Notes (Signed)
  SUBJECTIVE:   CHIEF COMPLAINT / HPI:   Postpartum depression follow up:  Appt with Dr. Shawnee Knapp was scheduled in past  Was rx Zoloft and has been taking consistently Has several environmental factors at play associated with her symptoms  She denies SI/HI She does report stress associated with having a new baby and preteen    Vaginal Irritation - recently became sexually active with partner, wants to be checked as she did not wait the full 6 weeks prior to having sexual intercourse  - preferred gender of partner: male  - Medications tried:  - Sexually active with 1 female partner(s) - Last sexual encounter: last week  - Contraception: none  Symptoms - Abnormal vaginal discharge: Yes-white then yellow  - itchy around clitoris  - felt that she ripped a stitch with sex  - Missed period: No - Fever: No - Abdominal/Pelvic pain: No - Vaginal bleeding: No - Pain during sex: Yes previously but not over last couple of days  - Rash: No - 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 No erythema present on labia or clitoris  Psych: Normal affect and mood  ASSESSMENT/PLAN:  Hemorrhoids Miralax ordered to soften stools   Postpartum depression    05/25/2022    9:18 AM 05/11/2022    9:12 AM 05/04/2022    2:00 PM  PHQ9 SCORE ONLY  PHQ-9 Total Score 16 16 19    Many factors contributing, on zoloft (increased dosage to 50mg ) Sees psych with Dr. 05/06/2022 today Discussed return precautions regarding mood  Recheck  mood in 1 month   Vaginal irritation Reviewed labs and allergies, will check HIV, RPR, GC/Chlamydia, wet prep and will call patient with results. Discussed safe sex practices. Will schedule pap further out from delivery of infant (6 weeks postpartum). Patient's questions answered to their satisfaction.    Orders Placed This Encounter  Procedures   HIV antibody (with reflex)   RPR   POCT Wet Prep [facility])   POCT urine pregnancy   Meds ordered this encounter  Medications   sertraline (ZOLOFT) 50 MG tablet    Sig: Take 1 tablet (50 mg total) by mouth daily.    Dispense:  30 tablet    Refill:  0   polyethylene glycol powder (GLYCOLAX/MIRALAX) 17 GM/SCOOP powder    Sig: Take 17 g by mouth 2 (two) times daily as needed.    Dispense:  3350 g    Refill:  1   Return in about 4 weeks (around 06/22/2022) for Mood . Shawnee Knapp, MD PGY-2 Family Medicine

## 2022-05-25 NOTE — Assessment & Plan Note (Signed)
Miralax ordered to soften stools

## 2022-05-25 NOTE — Assessment & Plan Note (Signed)
    05/25/2022    9:18 AM 05/11/2022    9:12 AM 05/04/2022    2:00 PM  PHQ9 SCORE ONLY  PHQ-9 Total Score 16 16 19    Many factors contributing, on zoloft (increased dosage to 50mg ) Sees psych with Dr. today Discussed return precautions regarding mood  Recheck mood in 1 month
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
## 2022-05-25 NOTE — Patient Instructions (Addendum)
It was great to see you today! Thank you for choosing [facility] for your primary care. Alicia Dunlap was seen for depression and vaginal itching.  Today we addressed: Please continue with counseling and Zoloft increased to 50 mg  Let us know if you experience any mood changes  We will follow up in 1 month   If you haven't already, sign up for My Chart to have easy access to your labs results, and communication with your primary care physician.  You should return to our clinic No follow-ups on file.  I recommend that you always bring your medications to each appointment as this makes it easy to ensure you are on the correct medications and helps [place] not miss refills when you need them.  Please arrive 15 minutes before your appointment to ensure smooth check in process.  We appreciate your efforts in making this happen.  Please call the clinic at [phone] if your symptoms worsen or you have any concerns.  Thank you for allowing me to participate in your care, Alfredo Martinez, MD PGY-2 Family Medicine

## 2022-05-26 LAB — HIV ANTIBODY (ROUTINE TESTING W REFLEX): HIV Screen 4th Generation wRfx: NONREACTIVE

## 2022-05-26 LAB — RPR: RPR Ser Ql: NONREACTIVE

## 2022-05-31 LAB — CERVICOVAGINAL ANCILLARY ONLY
Chlamydia: NEGATIVE
Comment: NEGATIVE
Comment: NEGATIVE
Comment: NORMAL
Neisseria Gonorrhea: NEGATIVE
Trichomonas: NEGATIVE

## 2022-06-01 ENCOUNTER — Telehealth: Payer: Self-pay

## 2022-06-01 NOTE — Telephone Encounter (Signed)
Patient updated.

## 2022-06-01 NOTE — Telephone Encounter (Signed)
Patient LVM on nurse line requesting to speak with Maxwell in regards to [facility] forms.   Patient reports she would like to know if these were completed and faxed off.   I do not see any mention of this in recent notes.   Will forward to [facility] and PCP.

## 2022-06-08 ENCOUNTER — Encounter: Payer: Self-pay | Admitting: Family Medicine

## 2022-06-08 ENCOUNTER — Ambulatory Visit (INDEPENDENT_AMBULATORY_CARE_PROVIDER_SITE_OTHER): Payer: Medicaid Other | Admitting: Family Medicine

## 2022-06-08 ENCOUNTER — Ambulatory Visit (INDEPENDENT_AMBULATORY_CARE_PROVIDER_SITE_OTHER): Payer: Medicaid Other | Admitting: Psychology

## 2022-06-08 VITALS — BP 114/78 | HR 86 | Ht 67.0 in | Wt 249.5 lb

## 2022-06-08 DIAGNOSIS — F32A Depression, unspecified: Secondary | ICD-10-CM | POA: Insufficient documentation

## 2022-06-08 DIAGNOSIS — F419 Anxiety disorder, unspecified: Secondary | ICD-10-CM | POA: Diagnosis not present

## 2022-06-08 DIAGNOSIS — R002 Palpitations: Secondary | ICD-10-CM | POA: Diagnosis not present

## 2022-06-08 DIAGNOSIS — M545 Low back pain, unspecified: Secondary | ICD-10-CM

## 2022-06-08 DIAGNOSIS — G8929 Other chronic pain: Secondary | ICD-10-CM

## 2022-06-08 MED ORDER — IBUPROFEN 600 MG PO TABS
600.0000 mg | ORAL_TABLET | Freq: Four times a day (QID) | ORAL | 0 refills | Status: DC
Start: 2022-06-08 — End: 2022-06-22

## 2022-06-08 NOTE — Progress Notes (Signed)
    SUBJECTIVE:   CHIEF COMPLAINT / HPI:   Heart palpitations Started in 2021 after covid vaccine. Went to ED for this but left often due to long wait times. Felt lightheaded at times but only when taking blood in ED. Feels anxious when this happens and begins to have SOB, sweating, and chest discomfort. Once she drinks water and breathes in and out, she feels much better.  Chronic back pain Started in 2012 after her first baby. Feels 9/10 pain in her lower back more so on the L side. Feels it has gotten worse since her subsequent births by moving into her hips and makes her legs weak. She has tried to have a massage, but this makes the pain worse. She has taken ibuprofen 600 in the past with relief. Feels the covid vaccine could have also contributed to this.  PERTINENT  PMH / PSH: postpartum depression, sickle cell trait, dyshidrotic eczema  OBJECTIVE:   BP 114/78   Pulse 86   Ht 5\' 7"  (1.702 m)   Wt 249 lb 8 oz (113.2 kg)   SpO2 98%   Breastfeeding Yes   BMI 39.08 kg/m   GEN: Sitting in chair, conversant, in NAD CAR: RRR, no m/r/g RES: CTAB ABD: Nondistended PSY: Anxious affect, pleasant NEU: 5/5 strength in extremities throughout, normal sensation, no other gross focal deficits MSK: TTP of L paraspinal muscles  ASSESSMENT/PLAN:   Anxiety Suspect anxiety as cause of palpitations given concurrent only with sweating, chest discomfort, and SOB that resolves with drinking water and breathing exercises to calm her down. Not currently taking her Zoloft. Appointment with Dr. today. Will follow up reservations about medication and assess resuming. Will also get CBC and TSH to assess for anemia and hyperthyroidism as causes for palpitations.  Chronic back pain Pain continued since 2012, has been seen before with similar findings. Not currently worried about neurological dysfunction. Suspect MSK in nature given multiple childbirths and body habitus. Counseled on conservative  management with ibuprofen 600 mg Q8 prn, use of heating pad, and good diet and exercise. Will get CMP to assess updated renal function for ibuprofen. Follow up as needed.   2013, MD [facility]

## 2022-06-08 NOTE — Assessment & Plan Note (Signed)
Suspect anxiety as cause of palpitations given concurrent only with sweating, chest discomfort, and SOB that resolves with drinking water and breathing exercises to calm her down. Not currently taking her Zoloft. Appointment with Dr. Shawnee Knapp today. Will follow up reservations about medication and assess resuming. Will also get CBC and TSH to assess for anemia and hyperthyroidism as causes for palpitations.

## 2022-06-08 NOTE — BH Specialist Note (Signed)
Integrated Behavioral Health Follow Up In-Person Visit  MRN: [id number] Name: Alicia Dunlap  Number of Integrated Behavioral Health Clinician visits: 2- Second Visit  Session Start time: 1420   Session End time: 1505  Total time in minutes: 45   Types of Service: Individual psychotherapy    Subjective: Alicia Dunlap is a 31 y.o. female  Patient was referred by Dr. Nobie Putnam for anxiety. Patient reports the following symptoms/concerns: Pt reported stressors in life involving parenting other children and adjusting to a newborn.  Reported coping strategies of breathing and taking walks.  Discussed alternative thoughts of what anxiety is and how it is about what control she has.  Reported food insecurity, good given for family from clinic. Duration of problem: past few months; Severity of problem: moderate  Objective: Mood: Euthymic and Affect: anxious at times tearful Risk of harm to self or others: No plan to harm self or others reported  Life Context: Family and Social: new child in home   Patient and/or Family's Strengths/Protective Factors: Social and Emotional competence  Goals Addressed: Patient will:  Reduce symptoms of: anxiety : racing thoughts; feeling overwhelmed and frustrated  Increase knowledge and/or ability of: coping skills : breathing; taking walks; challenging thoughts    Progress towards Goals: Ongoing  Interventions: Interventions utilized:  CBT Cognitive Behavioral Therapy and Supportive Reflection Standardized Assessments completed: Not Needed  Patient and/or Family Response: Pt engaged in treatment planning   Patient Centered Plan: Patient is on the following Treatment Plan(s): anxiety treatment plan Assessment: Patient currently experiencing anxiety due to life stressors.   Patient may benefit from CBT coping strategies.  Plan: Follow up with behavioral health clinician on : 2 weeks Behavioral recommendations: challenging anxious  thoughts  Referral(s): Integrated [employer] (In Clinic) Royetta Asal, PhD., LMFT

## 2022-06-08 NOTE — Patient Instructions (Signed)
It was great seeing you today! Here is what we talked about:  I think your symptoms are due to anxiety. We will talk with Dr. Shawnee Knapp for counseling and may choose to restart Zoloft. We will get thyroid and blood studies today to see if this is contributing to your palpitations. I will follow up the results. Your low back pain sounds like it could be due to strain or overuse. I recommend using a heating pad and using ibuprofen which I have represcribed. Other things you can do is diet and exercise, both of which can help you feel better and strengthen your muscles.  If you have any other questions, let us know!  Janeal Holmes, MD [facility]

## 2022-06-08 NOTE — Assessment & Plan Note (Signed)
Pain continued since 2012, has been seen before with similar findings. Not currently worried about neurological dysfunction. Suspect MSK in nature given multiple childbirths and body habitus. Counseled on conservative management with ibuprofen 600 mg Q8 prn, use of heating pad, and good diet and exercise. Will get CMP to assess updated renal function for ibuprofen. Follow up as needed.

## 2022-06-08 NOTE — Assessment & Plan Note (Signed)
>>  ASSESSMENT AND PLAN FOR ANXIETY WRITTEN ON 06/08/2022  2:26 PM BY MABE, GERALD, MD  Suspect anxiety as cause of palpitations given concurrent only with sweating, chest discomfort, and SOB that resolves with drinking water and breathing exercises to calm her down. Not currently taking her Zoloft . Appointment with Dr. Beryl today. Will follow up reservations about medication and assess resuming. Will also get CBC and TSH to assess for anemia and hyperthyroidism as causes for palpitations.

## 2022-06-09 ENCOUNTER — Telehealth: Payer: Self-pay | Admitting: Family Medicine

## 2022-06-09 LAB — COMPREHENSIVE METABOLIC PANEL
ALT: 9 IU/L (ref 0–32)
AST: 10 IU/L (ref 0–40)
Albumin/Globulin Ratio: 1.6 (ref 1.2–2.2)
Albumin: 4.3 g/dL (ref 4.0–5.0)
Alkaline Phosphatase: 119 IU/L (ref 44–121)
BUN/Creatinine Ratio: 13 (ref 9–23)
BUN: 11 mg/dL (ref 6–20)
Bilirubin Total: 0.3 mg/dL (ref 0.0–1.2)
CO2: 25 mmol/L (ref 20–29)
Calcium: 9.2 mg/dL (ref 8.7–10.2)
Chloride: 102 mmol/L (ref 96–106)
Creatinine, Ser: 0.82 mg/dL (ref 0.57–1.00)
Globulin, Total: 2.7 g/dL (ref 1.5–4.5)
Glucose: 74 mg/dL (ref 70–99)
Potassium: 4.1 mmol/L (ref 3.5–5.2)
Sodium: 142 mmol/L (ref 134–144)
Total Protein: 7 g/dL (ref 6.0–8.5)
eGFR: 99 mL/min/{1.73_m2} (ref >59)

## 2022-06-09 LAB — CBC
Hematocrit: 38.3 % (ref 34.0–46.6)
Hemoglobin: 12.6 g/dL (ref 11.1–15.9)
MCH: 27.3 pg (ref 26.6–33.0)
MCHC: 32.9 g/dL (ref 31.5–35.7)
MCV: 83 fL (ref 79–97)
Platelets: 326 10*3/uL (ref 150–450)
RBC: 4.62 x10E6/uL (ref 3.77–5.28)
RDW: 11.9 % (ref 11.7–15.4)
WBC: 7.4 10*3/uL (ref 3.4–10.8)

## 2022-06-09 LAB — TSH RFX ON ABNORMAL TO FREE T4: TSH: 1.22 u[IU]/mL (ref 0.450–4.500)

## 2022-06-09 NOTE — Telephone Encounter (Signed)
Sent MyChart message explaining normal CBC, CMP, and TSH and likely cause of heart palpitations being anxiety. She is working with Dr. Shawnee Knapp. I offered to help her restart Zoloft should she choose given the efficacy of both forms of therapy together. Will follow.  Janeal Holmes, MD

## 2022-06-12 ENCOUNTER — Telehealth: Payer: Self-pay | Admitting: Psychology

## 2022-06-12 DIAGNOSIS — Z5941 Food insecurity: Secondary | ICD-10-CM

## 2022-06-12 NOTE — Telephone Encounter (Signed)
Called pt about her food insecurity. Pt gave permission for CCM referral.   Dr. Hyacinth Meeker could you please put this in for her.   Royetta Asal, PhD., LMFT

## 2022-06-12 NOTE — Addendum Note (Signed)
Addended by: Glendale Chard on: 06/12/2022 08:23 PM   Modules accepted: Orders

## 2022-06-12 NOTE — Telephone Encounter (Signed)
Called pt regarding food insecurity and her needs for formula. She consented of being connected with [facility] Specialist, Milana Huntsman here.  Encouraged her to also speak with pediatrician of child.    Royetta Asal, PhD., LMFT

## 2022-06-13 DIAGNOSIS — Z419 Encounter for procedure for purposes other than remedying health state, unspecified: Secondary | ICD-10-CM | POA: Diagnosis not present

## 2022-06-14 ENCOUNTER — Other Ambulatory Visit: Payer: Self-pay

## 2022-06-14 NOTE — Patient Outreach (Signed)
  Medicaid Managed Care Social Work Note  06/14/2022 Name:  Alicia Dunlap MRN:  [id number] DOB:  11-13-1991  Alicia Dunlap is an 31 y.o. year old female who is a primary patient of Glendale Chard, DO.  The [facility] Managed Care Coordination team was consulted for assistance with:  Food Insecurity  Alicia Dunlap was given information about Medicaid Managed Care Coordination team services today. Alicia Dunlap Patient agreed to services and verbal consent obtained.  Engaged with patient  for by telephone forinitial visit in response to referral for case management and/or care coordination services.   Assessments/Interventions:  Review of past medical history, allergies, medications, health status, including review of consultants reports, laboratory and other test data, was performed as part of comprehensive evaluation and provision of chronic care management services.  SDOH: (Social Determinant of Health) assessments and interventions performed: BSW completed a telephone outreach with patient for food insecurity. Patient stated she does receive foodstamps and WIC on the 5th of each month but had to complete her recertification and needs foods assistance. BSW emailed food resources to [email or web address]. No other resources are needed at this time.  Advanced Directives Status:  Not addressed in this encounter.  Care Plan                 Allergies  Allergen Reactions   Amoxicillin Rash    Has patient had a PCN reaction causing immediate rash, facial/tongue/throat swelling, SOB or lightheadedness with hypotension: no Has patient had a PCN reaction causing severe rash involving mucus membranes or skin necrosis: no Has patient had a PCN reaction that required hospitalization no Has patient had a PCN reaction occurring within the last 10 years: yes If all of the above answers are "NO", then may proceed with Cephalosporin use.     Medications Reviewed Today     Reviewed by Alfredo Martinez, MD (Resident) on 05/25/22 at 1050  Med List Status: <None>   Medication Order Taking? Sig Documenting Provider Last Dose Status Informant    Discontinued 05/25/22 1050   ibuprofen (ADVIL) 600 MG tablet [id number]  Take 1 tablet (600 mg total) by mouth every 6 (six) hours. Cresenzo-Dishmon, Scarlette Calico, CNM  Active   polyethylene glycol powder (GLYCOLAX/MIRALAX) 17 GM/SCOOP powder [id number] Yes Take 17 g by mouth 2 (two) times daily as needed. Alfredo Martinez, MD  Active   sertraline (ZOLOFT) 50 MG tablet [id number]  Take 1 tablet (50 mg total) by mouth daily. Alfredo Martinez, MD  Active             Patient Active Problem List   Diagnosis Date Noted   Anxiety 06/08/2022   Postpartum depression 05/05/2022   Vaginal delivery 04/12/2022   Dyshidrotic eczema 12/18/2019   Hemorrhoids 10/15/2019   Herpes infection 03/20/2019   Sickle cell trait (HCC) 01/05/2019   General counseling and advice on contraceptive management 06/03/2014   Vaginal irritation 04/15/2014   Chronic back pain 02/26/2013    Conditions to be addressed/monitored per PCP order:   food resources  There are no care plans that you recently modified to display for this patient.   Follow up:  Patient agrees to Care Plan and Follow-up.  Plan: The Managed Medicaid care management team will reach out to the patient again over the next 14 days.  Date/time of next scheduled Social Work care management/care coordination outreach:  07/04/22  Gus Puma, Kenard Gower, [facility]  [facility]  High Risk Managed Medicaid Team  [phone]

## 2022-06-14 NOTE — Patient Instructions (Signed)
Visit Information  [name] was given information about Medicaid Managed Care team care coordination services as a part of their [facility] Medicaid benefit. [name] verbally consented to engagement with the [facility] Managed Care team.   If you are experiencing a medical emergency, please call 911 or report to your local emergency department or urgent care.   If you have a non-emergency medical problem during routine business hours, please contact your provider's office and ask to speak with a nurse.   For questions related to your [facility] health plan, please call: [phone] or go here:[email or web address]  If you would like to schedule transportation through your [facility] plan, please call the following number at least 2 days in advance of your appointment: [phone].  You can also use the MTM portal or MTM mobile app to manage your rides. For the portal, please go to [email or web address]  Call the [facility] Crisis Line at [phone], at any time, 24 hours a day, 7 days a week. If you are in danger or need immediate medical attention call 911.  If you would like help to quit smoking, call 1-800-QUIT-NOW (807-136-8642) OR Espaol: 1-855-Djelo-Ya (3-568-616-8372) o para ms informacin haga clic aqu or Text READY to 902-111 to register via text  [name] - following are the goals we discussed in your visit today:   Goals Addressed   None     Social Worker will follow up in 14 days .   Alicia Dunlap, BSW, [place] [facility]  [place]  High Risk Managed Medicaid Team  [phone]   Following is a copy of your plan of care:  There are no care plans that you recently modified to display for this patient.

## 2022-06-20 ENCOUNTER — Ambulatory Visit (INDEPENDENT_AMBULATORY_CARE_PROVIDER_SITE_OTHER): Payer: Medicaid Other | Admitting: Psychology

## 2022-06-20 DIAGNOSIS — F419 Anxiety disorder, unspecified: Secondary | ICD-10-CM | POA: Diagnosis not present

## 2022-06-20 NOTE — Patient Instructions (Signed)
Therapy and Counseling Resources Most providers on this list will take Medicaid. Patients with commercial insurance or Medicare should contact their insurance company to get a list of in network providers.  [facility] (takes children) Location 1: [address] [place], [place] [address] Location 2: [address] [place], [place] [address] [phone]   [facility] (spanish speaking therapist available)(habla espanol)(take medicare and medicaid)  [address], [place], [place] [address], [place] [email or web address] [phone]  [facility] [address] [place], [place] [address] [phone]  [facility]   [address]   [place], [place] [address]      [phone]  [facility] (spanish available) [address]  [place], [place] [address] [phone]  [facility] (take medicaid and medicare) [address]  [place], [place] [address]       [phone]     [facility] (virtual only) [phone]  [facility] [address], [place], [place] [phone]  [facility]:  [address] [place] [phone]  [facility]:  [address], [place] [phone]  [facility] (under & uninsured) [address]   [place] [phone]    [email or web address]    [facility] [address]  [place]    [phone]  [facility] -[address]     Phone:  [phone]     [place]-  [address]  [phone]   [facility] [address]      [place], [place] [phone]  [facility]: [address], [place], [place]  [phone]   [facility] (Spanish therapist) [email or web address]  [address]    [place] [address]    [phone]    [facility]   [address],  [place], [place]  [phone]   [facility]  [address] [place]  [phone]  [facility]  [address] [place], [place]        [phone]  Open Access/Walk In Clinic under & uninsured  [facility]  [address] [place], [place] [phone] Crisis [phone]  [facility] [place],  (Spanish)   [address], [place]: ([phone]) 8:30 - 12; 1 - 2:30  [facility],  [address], [place]    ([phone]):8:30 - 12; 2 - 3PM  [facility] [place],  [address],  [place]; ([phone]):   Mon - Fri 8 AM - 5 PM  [facility] [address] [place]  MWF 12:30 to 3:00 or call to schedule an appointment  [phone]  Specific Provider options Psychology Today  [email or web address] click on find a therapist  enter your zip code left side and select or tailor a therapist for your specific need.   [facility] Provider Directory [email or web address]  (Medicaid)   Follow all drop down to find a provider  Social Support program [facility] [place] [phone] or www.[email or web address] [address], [place], [place] Recovery support and educational   24- Hour Availability:   [facility]  [address] [place], [place] [phone] Crisis [phone]  [facility]  Crisis Line [phone]  [facility]   Crisis Service  [phone]   [facility] [place] [facility] Services  [phone] (after hours)  Therapeutic Alternative/Mobile Crisis   1-877-626-1772  [place] National Suicide Hotline  1-800-273-8255 (TALK)  Call 911 or go to emergency room  [facility] Crisis Line  (800-256-2452);  [place]   [facility]   (800-939-5911); [place], [place], [place], [place], [place], [place], [place]  

## 2022-06-20 NOTE — BH Specialist Note (Signed)
Integrated Behavioral Health Follow Up In-Person Visit  MRN: [id number] Name: Alicia Dunlap  Number of Integrated Behavioral Health Clinician visits: 3- Third Visit  Session Start time: 1430   Session End time: 1500  Total time in minutes: 30   Types of Service: Individual psychotherapy    Subjective: Alicia Dunlap is a 31 y.o. female a Patient was referred by Dr. Nobie Putnam for anxiety. Patient reports the following symptoms/concerns:  Duration of problem: Pt reported high anxiety related to worries about children and self as a mom.  Explored narratives around being a mom and beliefs she carries from when she had to leave her daughter with her aunt for child well-being (years ago).   Discussed times when she had experiences that challenged this negative beliefs.  Reported stress related going back to work and leaving kids in daycare.  Talked with patient about my maternity leave for 3 months starting in October. Resources given for her to get connected in community.   Duration of problem: past few months Severity of problem: moderate  Objective: Mood: Depressed and Affect: Tearful Risk of harm to self or others: No plan to harm self or others  Life Context: Family and Social: new infant at home School/Work: on maternity leave  Patient and/or Family's Strengths/Protective Factors: Social and Emotional competence  Goals Addressed: Patient will:  Reduce symptoms of: anxiety : worry; feeling overwhelmed; feeling tired; feeling down  Increase knowledge and/or ability of: coping skills : labeling if beliefs are based on previous experiences and challenging thoughts   Progress towards Goals: Ongoing  Interventions: Interventions utilized:  CBT Cognitive Behavioral Therapy and Supportive Reflection Standardized Assessments completed: Not Needed  Patient and/or Family Response: engaged in treatment plan  Patient Centered Plan: Patient is on the following  Treatment Plan(s): anxiety treatment plan Assessment: Patient currently experiencing anxiety related to life transitions.   Patient may benefit from CBT and supportive therapy.  Plan: Follow up with behavioral health clinician on : 1 week Behavioral recommendations: challenge negative thoughts with alternative thoughts  Referral(s): Integrated [facility] (In Clinic)  Royetta Asal, PhD., LMFT

## 2022-06-22 ENCOUNTER — Ambulatory Visit (INDEPENDENT_AMBULATORY_CARE_PROVIDER_SITE_OTHER): Payer: Medicaid Other | Admitting: Student

## 2022-06-22 ENCOUNTER — Encounter: Payer: Self-pay | Admitting: Student

## 2022-06-22 DIAGNOSIS — G8929 Other chronic pain: Secondary | ICD-10-CM | POA: Diagnosis not present

## 2022-06-22 DIAGNOSIS — M545 Low back pain, unspecified: Secondary | ICD-10-CM

## 2022-06-22 DIAGNOSIS — F53 Postpartum depression: Secondary | ICD-10-CM

## 2022-06-22 MED ORDER — SERTRALINE HCL 100 MG PO TABS: 100.0000 mg | ORAL_TABLET | Freq: Every day | ORAL | 0 refills | Status: DC

## 2022-06-22 MED ORDER — IBUPROFEN 800 MG PO TABS: 800.0000 mg | ORAL_TABLET | Freq: Three times a day (TID) | ORAL | 0 refills | Status: DC | PRN

## 2022-06-22 NOTE — Progress Notes (Signed)
SUBJECTIVE:   CHIEF COMPLAINT / HPI:   Anxiety/Depression: Symptoms: Patient reports that her heart races when she feels anxiety but does not truly experience palpitations.  She also reports that she feels "down" Age of onset of first mood disturbance: Patient remembers anxious feelings with racing heart back to 2021 Duration of CURRENT symptoms: She seems to have had an increase in depressed mood and anxiety with the birth of her baby 3 months ago. Impact on function: Patient has had severe anxiety and depression that has impacted her daily life and makes it difficult to take care of her children.  Her daughter is going to school over the next month and require school close as well as utensils and supplies.  She also reports that her daughter's father is unfortunately going to spend several years in jail and is not involved in her daughter's life.  Patient notes that she gets overwhelmed and has had food insecurity intermittently because she has not had difficulty obtaining food stamps and had difficulty with WIC.  She is also concerned about going back to work as her infant is only 33 months old and breast-fed.  Her mood seems to have not improved at all with the Zoloft that she has been taking, had difficulty obtaining Zoloft last time with Medicaid  Patient denies SI or HI. Sometimes she feels as if she is not a good mother and this also causes stressors in her life. Her eldest daughter is a preteen and can say some harmful things to her mother.  Psychiatric History - Diagnoses: PPD, Anxiety  - Hospitalizations:  None  - Pharmacotherapy; Zoloft 50 mg, stopped this and recently restarted secondary to worsening symptoms of anxiety  - Outpatient therapy: Therapy with [facility] [place] therapist, Dr. Shawnee Knapp   Medical conditions that might explain or contribute to symptoms: recently gave birth  PHQ-9:     06/22/2022    3:12 PM 06/08/2022    1:44 PM 05/25/2022    9:18 AM  PHQ9 SCORE ONLY  PHQ-9  Total Score 15 8 16     GAD7:     06/22/2022    3:13 PM  GAD 7 : Generalized Anxiety Score  Nervous, Anxious, on Edge 3  Control/stop worrying 3  Worry too much - different things 3  Trouble relaxing 3  Restless 2  Easily annoyed or irritable 3  Afraid - awful might happen 3  Total GAD 7 Score 20  Anxiety Difficulty Very difficult   Reported of associated heart palpitations on last visit 06/08/22 Had CBC and TSH that were both negative   Patient has chronic back pain that is been worse since delivery, she relates this to the epidural.  PERTINENT  PMH / PSH:   Anxiety/depression, G3P3003 recently gave birth  OBJECTIVE:  BP 120/76   Pulse 88   Ht 5\' 7"  (1.702 m)   Wt 253 lb 3.2 oz (114.9 kg)   SpO2 99%   Breastfeeding Yes   BMI 39.66 kg/m   General: NAD, pleasant, able to participate in exam, patient is nontoxic in appearance but intermittently tearful throughout exam Cardiac: RRR, no murmurs auscultated Skin: warm and dry, no rashes noted Neuro: alert, no obvious focal deficits, speech normal Back: Normal ambulation without foot drop Psych: Normal affect and depressed mood, normal thought process and appropriately responsive to questioning  ASSESSMENT/PLAN:  Postpartum depression Continue pharmacologic therapy at low dose and taper up if tolerated: Increased Zoloft dosage to 100 mg today Therapist options based on patients  insurance provided, sees Dr. Shawnee Knapp but will need to transition for a few months. She is setting up family therapy with daughter at [facility]  Patient without SI/HI; denies active plan and reports that they are safe to continue with outpatient treatment.  Close follow up 3 weeks Patient is working diligently to provide for her children, expressed that she is doing a great job for her children and needs to make time for herself    Chronic back pain Patient has been experiencing chronic back pain for some time without red flag symptoms next time  increase ibuprofen to 800, kidney function normal on CMP obtained at last visit 7/67/23. Will assess this further at next visit as mood was main concern during this visit in 3 weeks May benefit from PT and back exercises as well   Patient has been intermittently experiencing food insecurity, provided food from pantry here in office today and already has CCM referral placed.   No orders of the defined types were placed in this encounter.  Meds ordered this encounter  Medications   ibuprofen (ADVIL) 800 MG tablet    Sig: Take 1 tablet (800 mg total) by mouth every 8 (eight) hours as needed for moderate pain.    Dispense:  30 tablet    Refill:  0   sertraline (ZOLOFT) 100 MG tablet    Sig: Take 1 tablet (100 mg total) by mouth daily.    Dispense:  30 tablet    Refill:  0   Return in about 3 weeks (around 07/13/2022). Alfredo Martinez, MD 06/22/2022, 4:08 PM PGY-2, [facility]

## 2022-06-22 NOTE — Patient Instructions (Addendum)
It was great to see you today! Thank you for choosing [facility] for your primary care. Alicia Dunlap was seen for follow up.   Call if you have any questions, continue with Zoloft 100 mg daily    Taking the medicine as directed and not missing any doses is one of the best things you can do to treat your depression.  Here are some things to keep in mind:    Side effects (stomach upset, some increased anxiety) may happen before you notice a benefit.  These side effects typically go away over time. Changes to your dose of medicine or a change in medication all together is sometimes necessary Most people need to be on medication at least 6-12 months Many people will notice an improvement within two weeks but the full effect of the medication can take up to 4-6 weeks Stopping the medication when you start feeling better often results in a return of symptoms If you start having thoughts of hurting yourself or others after starting this medicine, please call me at [phone] immediately.  If you haven't already, sign up for My Chart to have easy access to your labs results, and communication with your primary care physician.  You should return to our clinic Return in about 3 weeks (around 07/13/2022).  I recommend that you always bring your medications to each appointment as this makes it easy to ensure you are on the correct medications and helps [place] not miss refills when you need them.  Please arrive 15 minutes before your appointment to ensure smooth check in process.  We appreciate your efforts in making this happen.  Please call the clinic at [phone] if your symptoms worsen or you have any concerns.  Thank you for allowing me to participate in your care, Alfredo Martinez, MD 06/22/2022, 3:49 PM PGY-2, [facility]

## 2022-06-22 NOTE — Assessment & Plan Note (Addendum)
Patient has been experiencing chronic back pain for some time without red flag symptoms next time increase ibuprofen to 800, kidney function normal on CMP obtained at last visit 7/67/23. Will assess this further at next visit as mood was main concern during this visit in 3 weeks May benefit from PT and back exercises as well

## 2022-06-22 NOTE — Assessment & Plan Note (Signed)
Continue pharmacologic therapy at low dose and taper up if tolerated: Increased Zoloft dosage to 100 mg today Therapist options based on patients insurance provided, sees Dr. Shawnee Knapp but will need to transition for a few months. She is setting up family therapy with daughter at [facility]  Patient without SI/HI; denies active plan and reports that they are safe to continue with outpatient treatment.  Close follow up 3 weeks Patient is working diligently to provide for her children, expressed that she is doing a great job for her children and needs to make time for herself

## 2022-06-26 ENCOUNTER — Telehealth: Payer: Self-pay | Admitting: *Deleted

## 2022-06-26 NOTE — Telephone Encounter (Signed)
Patient calling for 2 reasons:  When should she return to work? Did we fax the forms back that she provided at last visit? Jone Baseman, CMA

## 2022-06-29 ENCOUNTER — Telehealth: Payer: Self-pay | Admitting: Student

## 2022-06-29 ENCOUNTER — Ambulatory Visit (INDEPENDENT_AMBULATORY_CARE_PROVIDER_SITE_OTHER): Payer: Medicaid Other | Admitting: Psychology

## 2022-06-29 DIAGNOSIS — F419 Anxiety disorder, unspecified: Secondary | ICD-10-CM

## 2022-06-29 NOTE — BH Specialist Note (Signed)
Integrated Behavioral Health Follow Up In-Person Visit  MRN: [id number] Name: Alicia Dunlap  Number of Integrated Behavioral Health Clinician visits: 4- Fourth Visit  Session Start time: 1430   Session End time: 1510  Total time in minutes: 40   Types of Service: Individual psychotherapy   Subjective: Alicia Dunlap is a 31 y.o. female  Patient was referred by Dr. Nobie Putnam for anxiety. Patient reports the following symptoms/concerns: Pt reported an incident at the dentist where she got has some odd physical sx when they gave her anesthesia for a procedure.  She reports she has these odd sx where she has pain near her ribs and feels anxious.  Reported they used cold rags and tried to calm her. Encouraged her to speak to her provider about this.  Talked about how anxiety can add to making physical sx worse.  Shared coping strategy of coming up  with three words with each letter of alphabet.   Shared the stressors she has with her family.   Food given to her from clinic supply.    Duration of problem: past few months; Severity of problem: moderate  Objective: Mood: Euthymic and Affect: Appropriate Risk of harm to self or others: No plan to harm self or others reported   Life Context: Family and Social: new infant at home School/Work: on maternity leave   Patient and/or Family's Strengths/Protective Factors: Social and Emotional competence  Goals Addressed: Patient will:  Reduce symptoms of: anxiety: potential some physical manifestations; worry thoughts; feeling overwhelmed  Increase knowledge and/or ability of: coping skills : CBT coping strategies, challenging thoughts    Progress towards Goals: Ongoing  Interventions: Interventions utilized:  CBT Cognitive Behavioral Therapy and Supportive Counseling Standardized Assessments completed: Not Needed  Patient and/or Family Response: Pt engaged in treatment planning  Patient Centered Plan: Patient is on the  following Treatment Plan(s): anxiety treatment plan Assessment: Patient currently experiencing anxiety due to life transitions.   Patient may benefit from CBT.  Plan: Follow up with behavioral health clinician on : 2 weeks Behavioral recommendations: f/u with Dr. Jena Gauss; try CBT coping strategies  Referral(s): Integrated [employer] (In Clinic)   Royetta Asal, PhD., LMFT

## 2022-06-29 NOTE — Telephone Encounter (Signed)
Letter for work printed

## 2022-07-04 ENCOUNTER — Other Ambulatory Visit: Payer: Self-pay

## 2022-07-04 NOTE — Patient Outreach (Signed)
  Medicaid Managed Care Social Work Note  07/04/2022 Name:  Alicia Dunlap MRN:  [id number] DOB:  [birth year]  Alicia Dunlap is an 31 y.o. year old female who is a primary patient of Glendale Chard, DO.  The [facility] Managed Care Coordination team was consulted for assistance with:  Food Insecurity  Alicia Dunlap was given information about Medicaid Managed Care Coordination team services today. Alicia Dunlap Patient agreed to services and verbal consent obtained.  Engaged with patient  for by telephone forfollow up visit in response to referral for case management and/or care coordination services.   Assessments/Interventions:  Review of past medical history, allergies, medications, health status, including review of consultants reports, laboratory and other test data, was performed as part of comprehensive evaluation and provision of chronic care management services.  SDOH: (Social Determinant of Health) assessments and interventions performed: BSW completed a telephone outreach with patient. She stated she did receive her foodstamps for the month, but is currently waiting on her daughters formula to come in. BSW informed patient to contact [employer] for some formula until it comes in. No other resources are needed at this time.  Advanced Directives Status:  Not addressed in this encounter.  Care Plan                 Allergies  Allergen Reactions   Amoxicillin Rash    Has patient had a PCN reaction causing immediate rash, facial/tongue/throat swelling, SOB or lightheadedness with hypotension: no Has patient had a PCN reaction causing severe rash involving mucus membranes or skin necrosis: no Has patient had a PCN reaction that required hospitalization no Has patient had a PCN reaction occurring within the last 10 years: yes If all of the above answers are "NO", then may proceed with Cephalosporin use.     Medications Reviewed Today     Reviewed by Alfredo Martinez, MD  (Resident) on 06/22/22 at 1608  Med List Status: <None>   Medication Order Taking? Sig Documenting Provider Last Dose Status Informant  ibuprofen (ADVIL) 800 MG tablet [id number]  Take 1 tablet (800 mg total) by mouth every 8 (eight) hours as needed for moderate pain. Alfredo Martinez, MD  Active   polyethylene glycol powder (GLYCOLAX/MIRALAX) 17 GM/SCOOP powder [id number]  Take 17 g by mouth 2 (two) times daily as needed. Alfredo Martinez, MD  Active   sertraline (ZOLOFT) 100 MG tablet [id number]  Take 1 tablet (100 mg total) by mouth daily. 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   Follow up:  Patient agrees to Care Plan and Follow-up.  Plan: The Managed Medicaid care management team will reach out to the patient again over the next 30-45 days.  Date/time of next scheduled Social Work care management/care coordination outreach:  08/11/22  Gus Puma, Kenard Gower, [facility]  [facility]  High Risk Managed Medicaid Team  [phone]

## 2022-07-04 NOTE — Patient Instructions (Signed)
Visit Information  [name] was given information about Medicaid Managed Care team care coordination services as a part of their [facility] Medicaid benefit. Alicia Dunlap verbally consented to engagement with the [facility] Managed Care team.   If you are experiencing a medical emergency, please call 911 or report to your local emergency department or urgent care.   If you have a non-emergency medical problem during routine business hours, please contact your provider's office and ask to speak with a nurse.   For questions related to your [facility] health plan, please call: [phone] or go here:[email or web address]  If you would like to schedule transportation through your [facility] plan, please call the following number at least 2 days in advance of your appointment: [phone].  You can also use the MTM portal or MTM mobile app to manage your rides. For the portal, please go to [email or web address]  Call the [facility] Crisis Line at [phone], at any time, 24 hours a day, 7 days a week. If you are in danger or need immediate medical attention call 911.  If you would like help to quit smoking, call 1-800-QUIT-NOW ((951)437-6089) OR Espaol: 1-855-Djelo-Ya (0-109-323-5573) o para ms informacin haga clic aqu or Text READY to 220-254 to register via text  [name] - following are the goals we discussed in your visit today:   Goals Addressed   None       Social Worker will follow up 30-45.   Alicia Dunlap, BSW, [place] [facility]  [place]  High Risk Managed Medicaid Team  [phone]   Following is a copy of your plan of care:  There are no care plans that you recently modified to display for this patient.

## 2022-07-13 ENCOUNTER — Ambulatory Visit: Payer: Medicaid Other | Admitting: Family Medicine

## 2022-07-13 ENCOUNTER — Ambulatory Visit: Payer: Medicaid Other | Admitting: Psychology

## 2022-07-14 DIAGNOSIS — Z419 Encounter for procedure for purposes other than remedying health state, unspecified: Secondary | ICD-10-CM | POA: Diagnosis not present

## 2022-08-11 ENCOUNTER — Other Ambulatory Visit: Payer: Self-pay

## 2022-08-11 NOTE — Patient Instructions (Signed)
Visit Information  Alicia Dunlap was given information about Medicaid Managed Care team care coordination services as a part of their [facility] Medicaid benefit. Alicia Dunlap verbally consented to engagement with the [facility] Managed Care team.   If you are experiencing a medical emergency, please call 911 or report to your local emergency department or urgent care.   If you have a non-emergency medical problem during routine business hours, please contact your provider's office and ask to speak with a nurse.   For questions related to your [facility] health plan, please call: [phone] or go here:[email or web address]  If you would like to schedule transportation through your [facility] plan, please call the following number at least 2 days in advance of your appointment: [phone].  You can also use the MTM portal or MTM mobile app to manage your rides. For the portal, please go to [email or web address].  Call the [place] at [phone], at any time, 24 hours a day, 7 days a week. If you are in danger or need immediate medical attention call 911.  If you would like help to quit smoking, call 1-800-QUIT-NOW 248-687-1297) OR Espaol: 1-855-Djelo-Ya (1-941-740-8144) o para ms informacin haga clic aqu or Text READY to 200-400 to register via text  Alicia Dunlap - following are the goals we discussed in your visit today:   Goals Addressed   None       The  Patient                                              has been provided with contact information for the Managed Medicaid care management team and has been advised to call with any health related questions or concerns.   Alicia Dunlap, BSW, [place] Managed Medicaid Team  [phone]   Following is a copy of your plan of care:  There are no care plans that you recently modified to display for this patient.

## 2022-08-11 NOTE — Patient Outreach (Signed)
Medicaid Managed Care Social Work Note  08/11/2022 Name:  Alicia Dunlap MRN:  [id number] DOB:  19-Feb-1991  Alicia Dunlap is an 31 y.o. year old female who is a primary patient of Darci Current, DO.  The Medicaid Managed Care Coordination team was consulted for assistance with:  Community Resources   Alicia Dunlap was given information about Medicaid Managed Care Coordination team services today. Alicia Dunlap Patient agreed to services and verbal consent obtained.  Engaged with patient  for by telephone forfollow up visit in response to referral for case management and/or care coordination services.   Assessments/Interventions:  Review of past medical history, allergies, medications, health status, including review of consultants reports, laboratory and other test data, was performed as part of comprehensive evaluation and provision of chronic care management services.  SDOH: (Social Determinant of Health) assessments and interventions performed: BSW completed a telephone outreach with patient. She stated everything was going well and she is now back at work. She is still needing formula for her baby and was waiting for Alicia Dunlap to call her back to let her know if they got some in yet. Patient has been unable to go to [facility] due to having to go back to work. BSW will reach out to Alicia Dunlap for an update. No other resources are needed at this time.   Advanced Directives Status:  Not addressed in this encounter.  Care Plan                 Allergies  Allergen Reactions   Amoxicillin Rash    Has patient had a PCN reaction causing immediate rash, facial/tongue/throat swelling, SOB or lightheadedness with hypotension: no Has patient had a PCN reaction causing severe rash involving mucus membranes or skin necrosis: no Has patient had a PCN reaction that required hospitalization no Has patient had a PCN reaction occurring within the last 10 years: yes If all of the above answers are  "NO", then may proceed with Cephalosporin use.     Medications Reviewed Today     Reviewed by Erskine Emery, MD (Resident) on 06/22/22 at 1608  Med List Status: <None>   Medication Order Taking? Sig Documenting Provider Last Dose Status Informant  ibuprofen (ADVIL) 800 MG tablet [id number]  Take 1 tablet (800 mg total) by mouth every 8 (eight) hours as needed for moderate pain. Erskine Emery, MD  Active   polyethylene glycol powder (GLYCOLAX/MIRALAX) 17 GM/SCOOP powder [id number]  Take 17 g by mouth 2 (two) times daily as needed. Erskine Emery, MD  Active   sertraline (ZOLOFT) 100 MG tablet [id number]  Take 1 tablet (100 mg total) by mouth daily. Erskine Emery, MD  Active             Patient Active Problem List   Diagnosis Date Noted   Anxiety 06/08/2022   Postpartum depression 05/05/2022   Dyshidrotic eczema 12/18/2019   Hemorrhoids 10/15/2019   Herpes infection 03/20/2019   Sickle cell trait ([place]) 01/05/2019   Chronic back pain 02/26/2013    Conditions to be addressed/monitored per PCP order:   formula   There are no care plans that you recently modified to display for this patient.   Follow up:  Patient agrees to Care Plan and Follow-up.  Plan: The  Patient has been provided with contact information for the Managed Medicaid care management team and has been advised to call with any health related questions or concerns.   Mickel Fuchs, BSW, [place]  [place] Managed Medicaid Team  [phone]

## 2022-08-13 DIAGNOSIS — Z419 Encounter for procedure for purposes other than remedying health state, unspecified: Secondary | ICD-10-CM | POA: Diagnosis not present

## 2022-09-13 DIAGNOSIS — Z419 Encounter for procedure for purposes other than remedying health state, unspecified: Secondary | ICD-10-CM | POA: Diagnosis not present

## 2022-10-04 ENCOUNTER — Other Ambulatory Visit: Payer: Self-pay | Admitting: Family Medicine

## 2022-10-13 DIAGNOSIS — Z419 Encounter for procedure for purposes other than remedying health state, unspecified: Secondary | ICD-10-CM | POA: Diagnosis not present

## 2022-11-13 DIAGNOSIS — Z419 Encounter for procedure for purposes other than remedying health state, unspecified: Secondary | ICD-10-CM | POA: Diagnosis not present

## 2022-12-14 DIAGNOSIS — Z419 Encounter for procedure for purposes other than remedying health state, unspecified: Secondary | ICD-10-CM | POA: Diagnosis not present

## 2022-12-25 ENCOUNTER — Other Ambulatory Visit: Payer: Self-pay

## 2022-12-25 ENCOUNTER — Encounter: Payer: Self-pay | Admitting: Student

## 2022-12-25 ENCOUNTER — Ambulatory Visit (INDEPENDENT_AMBULATORY_CARE_PROVIDER_SITE_OTHER): Payer: Medicaid Other | Admitting: Student

## 2022-12-25 VITALS — BP 120/86 | HR 95 | Ht 67.0 in | Wt 256.8 lb

## 2022-12-25 DIAGNOSIS — R8290 Unspecified abnormal findings in urine: Secondary | ICD-10-CM

## 2022-12-25 DIAGNOSIS — N926 Irregular menstruation, unspecified: Secondary | ICD-10-CM | POA: Diagnosis not present

## 2022-12-25 DIAGNOSIS — N912 Amenorrhea, unspecified: Secondary | ICD-10-CM

## 2022-12-25 DIAGNOSIS — N390 Urinary tract infection, site not specified: Secondary | ICD-10-CM | POA: Insufficient documentation

## 2022-12-25 DIAGNOSIS — N3000 Acute cystitis without hematuria: Secondary | ICD-10-CM

## 2022-12-25 DIAGNOSIS — R4589 Other symptoms and signs involving emotional state: Secondary | ICD-10-CM | POA: Diagnosis not present

## 2022-12-25 LAB — POCT URINALYSIS DIP (MANUAL ENTRY)
Bilirubin, UA: NEGATIVE
Glucose, UA: NEGATIVE mg/dL
Ketones, POC UA: NEGATIVE mg/dL
Nitrite, UA: POSITIVE — AB
Protein Ur, POC: NEGATIVE mg/dL
Spec Grav, UA: 1.015 (ref 1.010–1.025)
Urobilinogen, UA: 0.2 E.U./dL
pH, UA: 5.5 (ref 5.0–8.0)

## 2022-12-25 LAB — POCT URINE PREGNANCY: Preg Test, Ur: NEGATIVE

## 2022-12-25 MED ORDER — NITROFURANTOIN MONOHYD MACRO 100 MG PO CAPS: 100.0000 mg | ORAL_CAPSULE | Freq: Two times a day (BID) | ORAL | 0 refills | Status: AC

## 2022-12-25 MED ORDER — NITROFURANTOIN MONOHYD MACRO 100 MG PO CAPS: 100.0000 mg | ORAL_CAPSULE | Freq: Two times a day (BID) | ORAL | 0 refills | Status: DC

## 2022-12-25 NOTE — Assessment & Plan Note (Signed)
Urine pregnancy negative.  Still breast-feeding. Encouraged some form of contraception, patient declined. Discussed safe sex practices.

## 2022-12-25 NOTE — Assessment & Plan Note (Signed)
Much improved.  PHQ-9 score of 2. Is not taking Zoloft.  Did discuss trial of other SSRIs, patient declined for now. No SI or HI.

## 2022-12-25 NOTE — Patient Instructions (Addendum)
It was great seeing you today.  You have a UTI- please take the antibiotic Macrobid for 7 days.  You are not pregnant.   If you have any questions or concerns, please feel free to call the clinic.   Have a wonderful day,  Dr. Orvis Brill [facility] [phone]

## 2022-12-25 NOTE — Assessment & Plan Note (Signed)
POC urinalysis dipstick positive for nitrites and leukocytes. Will treat with Macrobid twice daily x 7 days.  No sign of upper tract infection Return precautions discussed should she have fever, chills, nausea or vomiting, worsening dysuria.

## 2022-12-25 NOTE — Assessment & Plan Note (Signed)
>>  ASSESSMENT AND PLAN FOR DEPRESSED MOOD WRITTEN ON 12/25/2022  9:17 AM BY DAMERON, MARISA, DO  Much improved.  PHQ-9 score of 2. Is not taking Zoloft .  Did discuss trial of other SSRIs, patient declined for now. No SI or HI.

## 2022-12-25 NOTE — Progress Notes (Signed)
    SUBJECTIVE:   CHIEF COMPLAINT / HPI:   Alicia Dunlap is a 32 year-old female here for strong-smelling urine and concern for pregnancy.  Dysuria: Wondering if it is from dehydration or holding her urine because of being at work. Smells "rotten-egg" urine.  No burning with urination. Has a little bit of back pain. No fever, chills or vomiting. Does have nausea.  Irregular menses Has an 38 month old at home. Not desiring pregnancy. Has not been having regular periods. Had 4 day period in December, but usually has 5 day periods. Missed period in January. She is breastfeeding.  She is not desiring birth control. Had Nexplanon x2 and had a lot of irregular bleeding. She is sexually active.   Depressed mood: Not taking Zoloft- says that it made her feel worse. She says she is doing better. No SI.  PERTINENT  PMH / PSH: Reviewed  OBJECTIVE:   BP 120/86   Pulse 95   Ht 5\' 7"  (1.702 m)   Wt 256 lb 12.8 oz (116.5 kg)   SpO2 98%   BMI 40.22 kg/m   General: Alert and cooperative and appears to be in no acute distress Cardio: Normal S1 and S2, no S3 or S4. Rhythm is regular. No murmurs or rubs.   Pulm: Clear to auscultation bilaterally, no crackles, wheezing, or diminished breath sounds. Normal respiratory effort Abdomen: Bowel sounds normal. Abdomen soft and non-tender. No suprapubic tenderness. No CVA tenderness Extremities: No peripheral edema. Warm/ well perfused.  Strong radial pulses. Neuro: Cranial nerves grossly intact   ASSESSMENT/PLAN:   UTI (urinary tract infection) POC urinalysis dipstick positive for nitrites and leukocytes. Will treat with Macrobid twice daily x 7 days.  No sign of upper tract infection Return precautions discussed should she have fever, chills, nausea or vomiting, worsening dysuria.  Irregular menses Urine pregnancy negative.  Still breast-feeding. Encouraged some form of contraception, patient declined. Discussed safe sex practices.  Depressed  mood Much improved.  PHQ-9 score of 2. Is not taking Zoloft.  Did discuss trial of other SSRIs, patient declined for now. No SI or HI.     Orvis Brill, [place]

## 2023-01-12 DIAGNOSIS — Z419 Encounter for procedure for purposes other than remedying health state, unspecified: Secondary | ICD-10-CM | POA: Diagnosis not present

## 2023-02-12 DIAGNOSIS — Z419 Encounter for procedure for purposes other than remedying health state, unspecified: Secondary | ICD-10-CM | POA: Diagnosis not present

## 2023-03-14 DIAGNOSIS — Z419 Encounter for procedure for purposes other than remedying health state, unspecified: Secondary | ICD-10-CM | POA: Diagnosis not present

## 2023-04-14 DIAGNOSIS — Z419 Encounter for procedure for purposes other than remedying health state, unspecified: Secondary | ICD-10-CM | POA: Diagnosis not present

## 2023-05-12 IMAGING — US US MFM OB FOLLOW-UP
1 series · 14 of 28 positions shown · non-contrast
Comparison: none

[Series 1: us mfm ob follow-up · 14 of 28 slices shown]
[im 2/28]
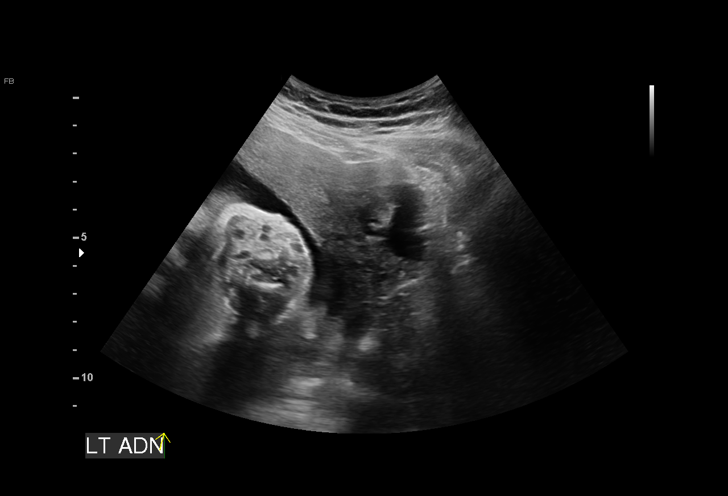
[im 4/28]
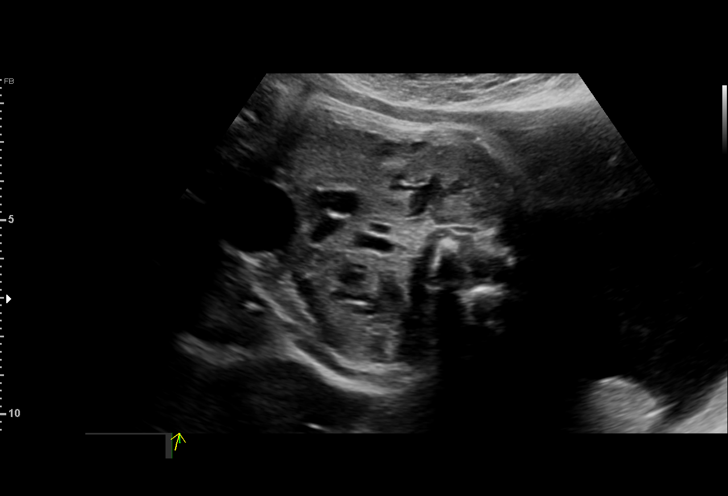
[im 6/28]
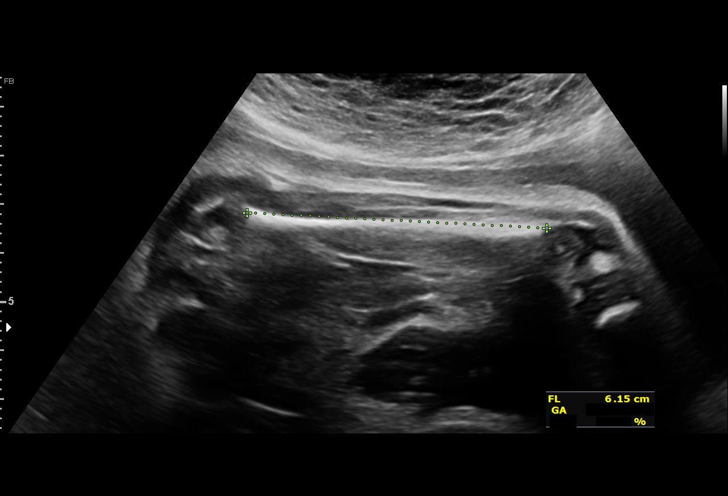
[im 8/28]
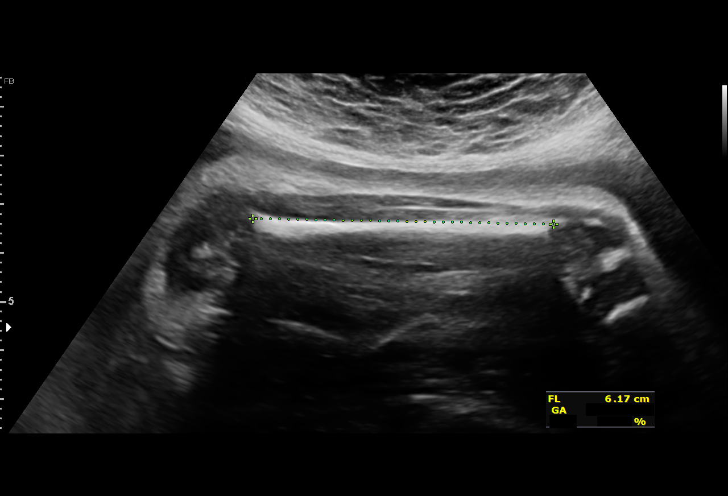
[im 10/28]
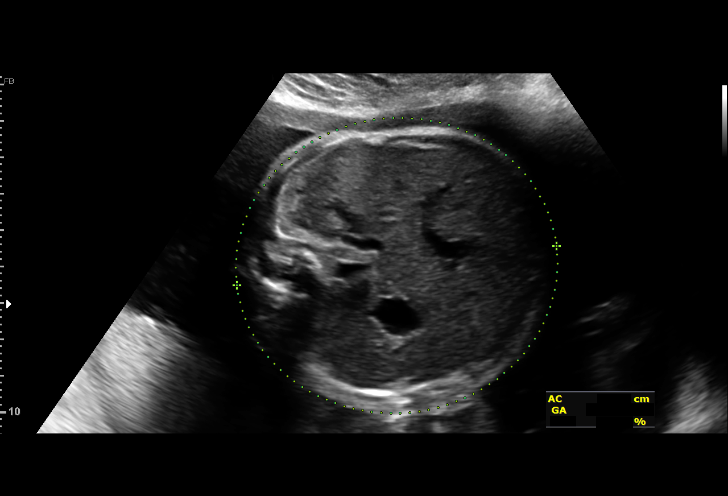
[im 12/28]
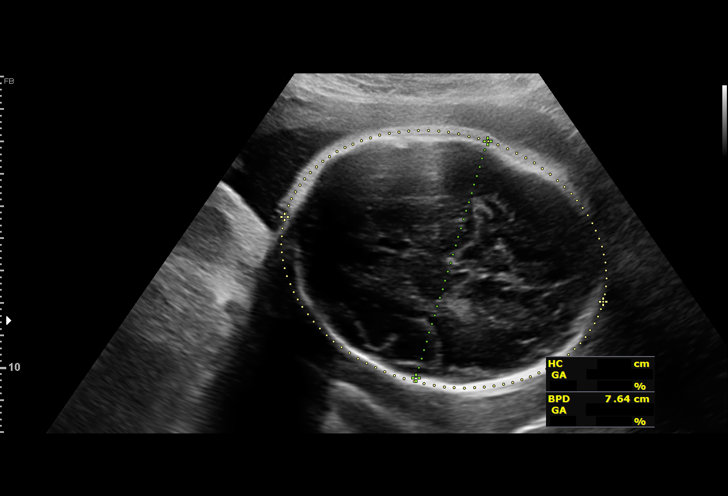
[im 14/28]
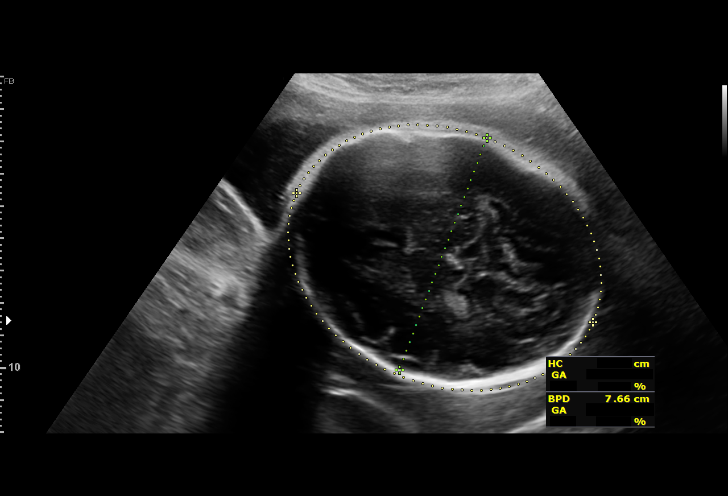
[im 16/28]
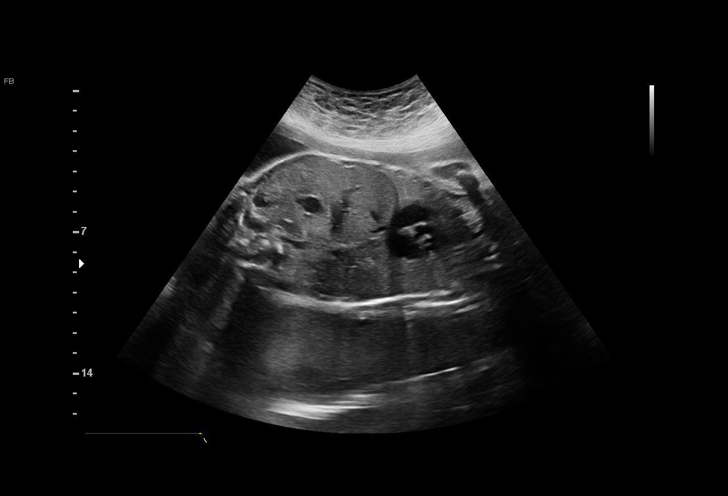
[im 18/28]
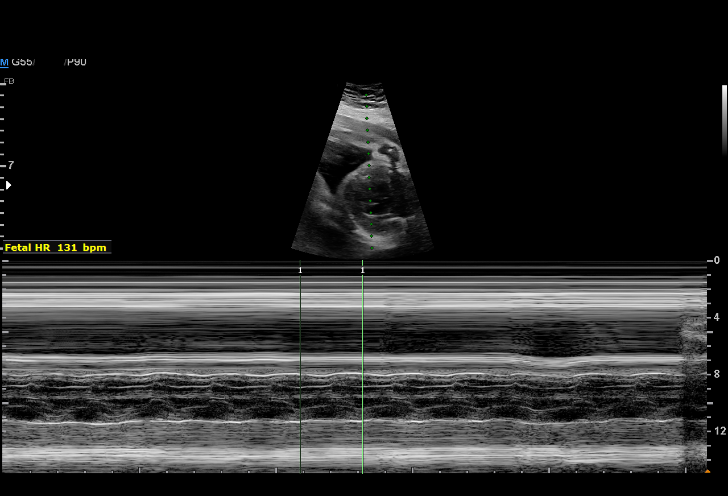
[im 20/28]
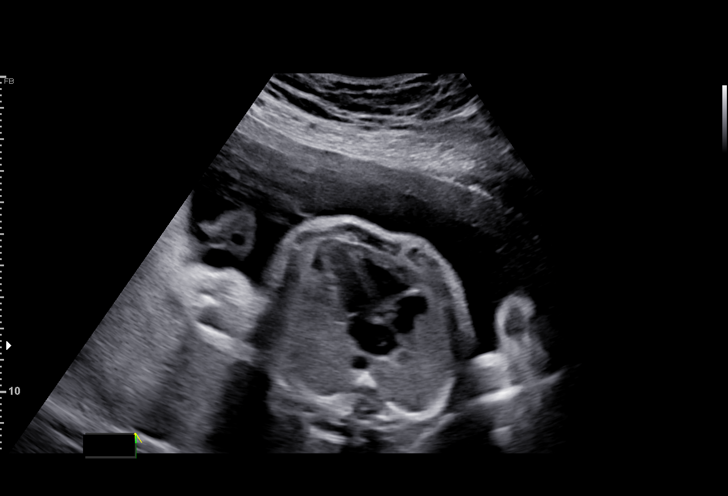
[im 22/28]
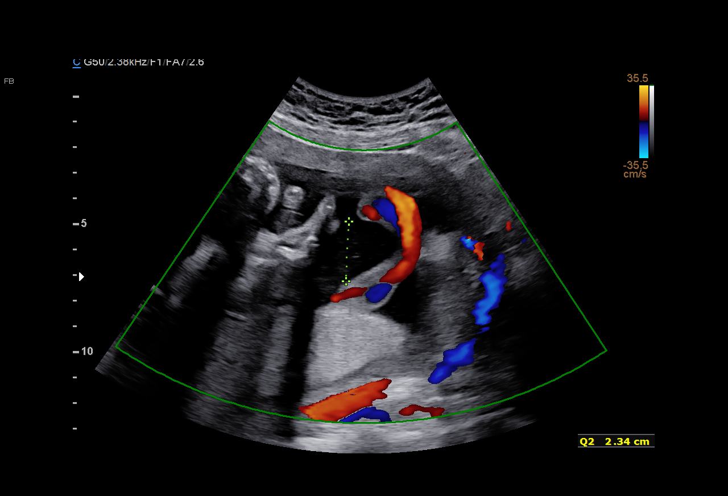
[im 24/28]
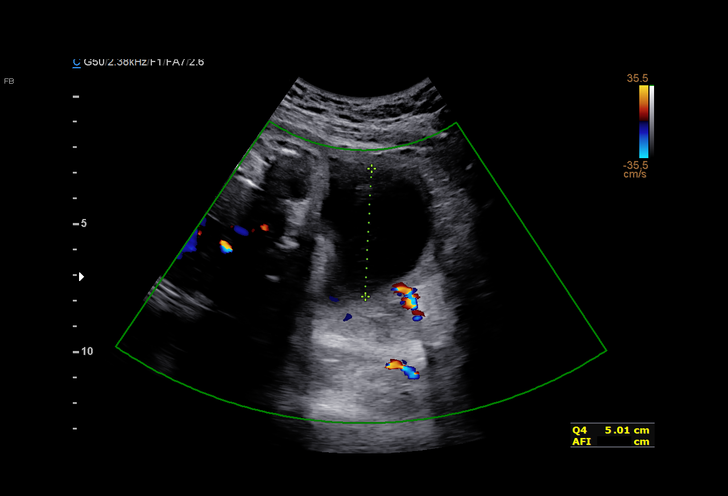
[im 26/28]
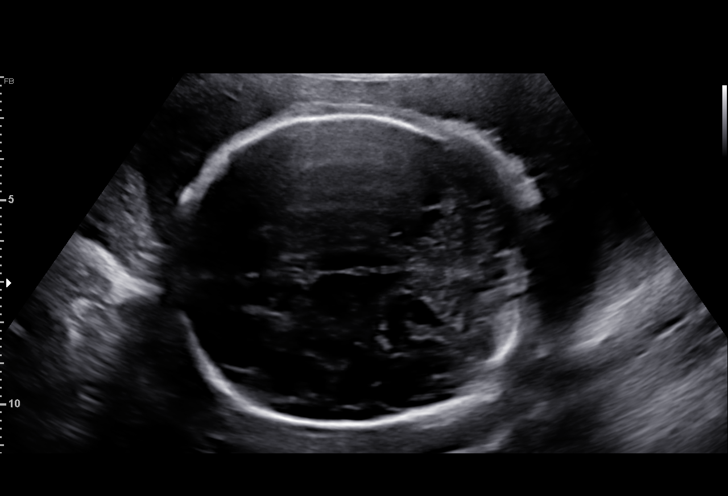
[im 28/28]
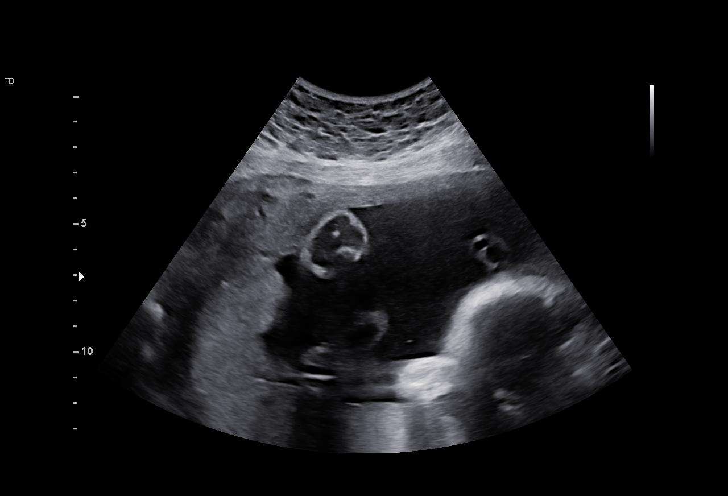

[14 of 28 positions shown; findings below may reference images not displayed]

Name:       BAEK SHIN NALEE                 Visit Date: 02/02/2022 [DATE]

Indications

 Obesity complicating pregnancy, second
 trimester (pregravid BMI 39)
 History of sickle cell trait
 30 weeks gestation of pregnancy
 LR NIPS
Fetal Evaluation

 Num Of Fetuses:         1
 Fetal Heart Rate(bpm):  131
 Cardiac Activity:       Observed
 Presentation:           Cephalic
 Placenta:               Posterior
 P. Cord Insertion:      Previously Visualized

 Amniotic Fluid
 AFI FV:      Within normal limits

 AFI Sum(cm)     %Tile       Largest Pocket(cm)
 13.7            44          5.

 RUQ(cm)       RLQ(cm)       LUQ(cm)        LLQ(cm)
 4.3           5
Biometry

 BPD:      76.5  mm     G. Age:  30w 5d         38  %    CI:        73.69   %    70 - 86
                                                         FL/HC:      21.7   %    19.3 -
 HC:      283.1  mm     G. Age:  31w 0d         23  %    HC/AC:      1.07        0.96 -
 AC:      264.5  mm     G. Age:  30w 4d         42  %    FL/BPD:     80.3   %    71 - 87
 FL:       61.4  mm     G. Age:  31w 6d         68  %    FL/AC:      23.2   %    20 - 24
 Est. FW:    5597  gm    3 lb 12 oz      48  %
OB History

 Gravidity:    3         Term:   2
 Living:       2
Gestational Age

 LMP:           30w 6d        Date:  07/01/21                 EDD:   04/07/22
 U/S Today:     31w 0d                                        EDD:   04/06/22
 Best:          30w 5d     Det. By:  Previous Ultrasound      EDD:   04/08/22
                                     (08/18/21)
Anatomy

 Cranium:               Appears normal         LVOT:                   Previously seen
 Cavum:                 Previously seen        Aortic Arch:            Previously seen
 Ventricles:            Previously seen        Ductal Arch:            Appears normal
 Choroid Plexus:        Previously seen        Diaphragm:              Previously seen
 Cerebellum:            Previously seen        Stomach:                Appears normal, left
                                                                       sided
 Posterior Fossa:       Previously seen        Abdomen:                Previously seen
 Nuchal Fold:           Previously seen        Abdominal Wall:         Previously seen
 Face:                  Orbits and profile     Cord Vessels:           Previously seen
                        previously seen
 Lips:                  Previously seen        Kidneys:                Appear normal
 Palate:                Not well visualized    Bladder:                Appears normal
 Thoracic:              Previously seen        Spine:                  Previously seen
 Heart:                 Appears normal         Upper Extremities:      Previously seen
                        (4CH, axis, and
                        situs)
 RVOT:                  Previously seen        Lower Extremities:      Previously seen

 Other:  VC, 3VV and 3VTV, Heels/feet and open left hand/5th digits, nasal
         bone, lenses, and mandible, female gender previously visualized.
Cervix Uterus Adnexa

 Adnexa
 No abnormality visualized.
Comments

 This patient was seen for a follow up growth scan due to
 maternal obesity with a BMI of 39.  She denies any problems
 since her last exam.
 She was informed that the fetal growth and amniotic fluid
 level appears appropriate for her gestational age.
 Due to maternal obesity, a follow-up growth scan was
 scheduled in 5 weeks.

## 2023-05-14 DIAGNOSIS — Z419 Encounter for procedure for purposes other than remedying health state, unspecified: Secondary | ICD-10-CM | POA: Diagnosis not present

## 2023-06-14 DIAGNOSIS — Z419 Encounter for procedure for purposes other than remedying health state, unspecified: Secondary | ICD-10-CM | POA: Diagnosis not present

## 2023-07-15 DIAGNOSIS — Z419 Encounter for procedure for purposes other than remedying health state, unspecified: Secondary | ICD-10-CM | POA: Diagnosis not present

## 2023-08-14 DIAGNOSIS — Z419 Encounter for procedure for purposes other than remedying health state, unspecified: Secondary | ICD-10-CM | POA: Diagnosis not present

## 2023-09-14 DIAGNOSIS — Z419 Encounter for procedure for purposes other than remedying health state, unspecified: Secondary | ICD-10-CM | POA: Diagnosis not present

## 2023-10-14 DIAGNOSIS — Z419 Encounter for procedure for purposes other than remedying health state, unspecified: Secondary | ICD-10-CM | POA: Diagnosis not present

## 2023-11-14 DIAGNOSIS — Z419 Encounter for procedure for purposes other than remedying health state, unspecified: Secondary | ICD-10-CM | POA: Diagnosis not present

## 2023-12-15 DIAGNOSIS — Z419 Encounter for procedure for purposes other than remedying health state, unspecified: Secondary | ICD-10-CM | POA: Diagnosis not present

## 2024-01-12 DIAGNOSIS — Z419 Encounter for procedure for purposes other than remedying health state, unspecified: Secondary | ICD-10-CM | POA: Diagnosis not present

## 2024-02-23 DIAGNOSIS — Z419 Encounter for procedure for purposes other than remedying health state, unspecified: Secondary | ICD-10-CM | POA: Diagnosis not present

## 2024-03-24 DIAGNOSIS — Z419 Encounter for procedure for purposes other than remedying health state, unspecified: Secondary | ICD-10-CM | POA: Diagnosis not present

## 2024-04-24 DIAGNOSIS — Z419 Encounter for procedure for purposes other than remedying health state, unspecified: Secondary | ICD-10-CM | POA: Diagnosis not present

## 2024-05-24 DIAGNOSIS — Z419 Encounter for procedure for purposes other than remedying health state, unspecified: Secondary | ICD-10-CM | POA: Diagnosis not present

## 2024-07-08 ENCOUNTER — Encounter: Payer: Self-pay | Admitting: Student

## 2024-07-08 ENCOUNTER — Ambulatory Visit (INDEPENDENT_AMBULATORY_CARE_PROVIDER_SITE_OTHER): Admitting: Student

## 2024-07-08 ENCOUNTER — Other Ambulatory Visit (HOSPITAL_COMMUNITY)
Admission: RE | Admit: 2024-07-08 | Discharge: 2024-07-08 | Disposition: A | Discharge status: Home / Self Care | Source: Ambulatory Visit | Attending: Family Medicine | Admitting: Family Medicine

## 2024-07-08 VITALS — BP 110/70 | HR 88 | Ht 67.0 in | Wt 270.0 lb

## 2024-07-08 DIAGNOSIS — Z3009 Encounter for other general counseling and advice on contraception: Secondary | ICD-10-CM

## 2024-07-08 DIAGNOSIS — F419 Anxiety disorder, unspecified: Secondary | ICD-10-CM | POA: Diagnosis present

## 2024-07-08 DIAGNOSIS — Z124 Encounter for screening for malignant neoplasm of cervix: Secondary | ICD-10-CM | POA: Diagnosis present

## 2024-07-08 DIAGNOSIS — K219 Gastro-esophageal reflux disease without esophagitis: Secondary | ICD-10-CM

## 2024-07-08 DIAGNOSIS — Z113 Encounter for screening for infections with a predominantly sexual mode of transmission: Secondary | ICD-10-CM | POA: Diagnosis present

## 2024-07-08 DIAGNOSIS — F32A Depression, unspecified: Secondary | ICD-10-CM | POA: Diagnosis not present

## 2024-07-08 MED ORDER — HYDROXYZINE HCL 25 MG PO TABS: 25.0000 mg | ORAL_TABLET | Freq: Three times a day (TID) | ORAL | 0 refills | Status: AC | PRN

## 2024-07-08 MED ORDER — PANTOPRAZOLE SODIUM 40 MG PO TBEC: 40.0000 mg | DELAYED_RELEASE_TABLET | Freq: Every day | ORAL | 3 refills | Status: AC

## 2024-07-08 NOTE — Assessment & Plan Note (Signed)
 Patient reporting panic attacks every 2-3 days per week.  Not ready to be on daily medication Will prescribe Atarax  25 mg TID PRN for panic attacks  Patient to follow up in 2 weeks  No active SI

## 2024-07-08 NOTE — Patient Instructions (Signed)
 It was great to see you today!   I will send you a message through mychart with your results.   No future appointments.  Please arrive 15 minutes before your appointment to ensure smooth check in process.    Please call the clinic at [phone] if your symptoms worsen or you have any concerns.  Thank you for allowing me to participate in your care, Dr. Damien Pinal [facility]

## 2024-07-08 NOTE — Progress Notes (Cosign Needed Addendum)
    SUBJECTIVE:   CHIEF COMPLAINT / HPI:   Alicia Dunlap is a 33 y.o. female presenting for check up and pap smear.   Doing well. Using pull out method for contraception. Would like testing for STIs. Currently active with long term partner. Not desiring pregnancy.   PERTINENT  PMH / PSH: reviewed and updated.  OBJECTIVE:   BP 110/70   Pulse 88   Ht 5' 7 (1.702 m)   Wt 270 lb (122.5 kg)   LMP 07/04/2024   SpO2 98%   BMI 42.29 kg/m   Well-appearing, no acute distress Cardio: Regular rate, regular rhythm, no murmurs on exam. Pulm: Clear, no wheezing, no crackles. No increased work of breathing Abdominal: bowel sounds present, soft, non-tender, non-distended Extremities: no peripheral edema   Pelvic Exam: MA chaperone present  Normal external genitalia No abnormal discharge, on menstrual cycle  No cervical motion tenderness  Cervix visualized with no lesions    ASSESSMENT/PLAN:   Assessment & Plan Anxiety and depression Patient reporting panic attacks every 2-3 days per week.  Not ready to be on daily medication Will prescribe Atarax  25 mg TID PRN for panic attacks  Patient to follow up in 2 weeks  No active SI  Cervical cancer screening Pap preformed today  Routine screening for STI (sexually transmitted infection) GC, tric, RPR, and HIV collected  Gastroesophageal reflux disease, unspecified whether esophagitis present Prescribed protonix  40 mg nightly  Birth control counseling Discussed IUD options. Patient will consider at this time. Previously has tried OCPs. Not interested in restarting at this time. Not on prenatal vitamin.      Alicia Pinal, DO [facility]

## 2024-07-09 LAB — CERVICOVAGINAL ANCILLARY ONLY
Chlamydia: NEGATIVE
Comment: NEGATIVE
Comment: NEGATIVE
Comment: NORMAL
Neisseria Gonorrhea: NEGATIVE
Trichomonas: NEGATIVE

## 2024-07-09 LAB — CYTOLOGY - PAP
Comment: NEGATIVE
Diagnosis: NEGATIVE
High risk HPV: NEGATIVE

## 2024-07-09 LAB — RPR: RPR Ser Ql: NONREACTIVE

## 2024-07-09 LAB — HIV ANTIBODY (ROUTINE TESTING W REFLEX): HIV Screen 4th Generation wRfx: NONREACTIVE

## 2024-07-10 ENCOUNTER — Ambulatory Visit: Payer: Self-pay | Admitting: Student

## 2024-07-22 ENCOUNTER — Ambulatory Visit: Admitting: Student
